# Patient Record
Sex: Female | Born: 1955
Health system: Southern US, Community
[De-identification: ages and names within clinical notes are randomized; demographics above are authoritative.]

## PROBLEM LIST (undated history)

## (undated) DIAGNOSIS — K5792 Diverticulitis of intestine, part unspecified, without perforation or abscess without bleeding: Secondary | ICD-10-CM

## (undated) DIAGNOSIS — M1990 Unspecified osteoarthritis, unspecified site: Secondary | ICD-10-CM

## (undated) DIAGNOSIS — M797 Fibromyalgia: Secondary | ICD-10-CM

## (undated) DIAGNOSIS — N289 Disorder of kidney and ureter, unspecified: Secondary | ICD-10-CM

## (undated) DIAGNOSIS — K900 Celiac disease: Secondary | ICD-10-CM

## (undated) HISTORY — PX: NECK SURGERY: SHX720

## (undated) HISTORY — PX: REDUCTION MAMMAPLASTY: SUR839

## (undated) HISTORY — DX: Fibromyalgia: M79.7

## (undated) HISTORY — DX: Unspecified osteoarthritis, unspecified site: M19.90

## (undated) HISTORY — DX: Celiac disease: K90.0

## (undated) HISTORY — PX: APPENDECTOMY: SHX54

## (undated) HISTORY — PX: COLONOSCOPY: SHX174

## (undated) HISTORY — DX: Diverticulitis of intestine, part unspecified, without perforation or abscess without bleeding: K57.92

## (undated) HISTORY — PX: BACK SURGERY: SHX140

## (undated) HISTORY — PX: TONSILLECTOMY AND ADENOIDECTOMY: SUR1326

---

## 1999-12-04 ENCOUNTER — Emergency Department (HOSPITAL_COMMUNITY): Admission: EM | Admit: 1999-12-04 | Discharge: 1999-12-04 | Payer: Self-pay | Admitting: Emergency Medicine

## 1999-12-04 ENCOUNTER — Encounter: Payer: Self-pay | Admitting: Family Medicine

## 1999-12-16 ENCOUNTER — Encounter: Admission: RE | Admit: 1999-12-16 | Discharge: 2000-03-15 | Payer: Self-pay | Admitting: Internal Medicine

## 2000-02-14 ENCOUNTER — Other Ambulatory Visit: Admission: RE | Admit: 2000-02-14 | Discharge: 2000-02-14 | Payer: Self-pay | Admitting: Obstetrics and Gynecology

## 2000-03-31 ENCOUNTER — Emergency Department (HOSPITAL_COMMUNITY): Admission: EM | Admit: 2000-03-31 | Discharge: 2000-03-31 | Payer: Self-pay | Admitting: Emergency Medicine

## 2000-03-31 ENCOUNTER — Encounter: Payer: Self-pay | Admitting: Emergency Medicine

## 2000-04-27 ENCOUNTER — Encounter: Payer: Self-pay | Admitting: Family Medicine

## 2000-04-27 ENCOUNTER — Encounter: Payer: Self-pay | Admitting: Emergency Medicine

## 2000-04-27 ENCOUNTER — Inpatient Hospital Stay (HOSPITAL_COMMUNITY): Admission: EM | Admit: 2000-04-27 | Discharge: 2000-05-01 | Payer: Self-pay | Admitting: Emergency Medicine

## 2000-09-05 ENCOUNTER — Emergency Department (HOSPITAL_COMMUNITY): Admission: EM | Admit: 2000-09-05 | Discharge: 2000-09-05 | Payer: Self-pay | Admitting: Emergency Medicine

## 2000-09-06 ENCOUNTER — Encounter: Payer: Self-pay | Admitting: Emergency Medicine

## 2000-09-06 ENCOUNTER — Ambulatory Visit (HOSPITAL_COMMUNITY): Admission: RE | Admit: 2000-09-06 | Discharge: 2000-09-06 | Payer: Self-pay | Admitting: Emergency Medicine

## 2001-03-02 ENCOUNTER — Ambulatory Visit (HOSPITAL_COMMUNITY): Admission: RE | Admit: 2001-03-02 | Discharge: 2001-03-02 | Payer: Self-pay | Admitting: Internal Medicine

## 2001-03-13 ENCOUNTER — Encounter (INDEPENDENT_AMBULATORY_CARE_PROVIDER_SITE_OTHER): Payer: Self-pay

## 2001-03-13 ENCOUNTER — Ambulatory Visit: Admission: RE | Admit: 2001-03-13 | Discharge: 2001-03-13 | Payer: Self-pay | Admitting: Internal Medicine

## 2001-07-13 ENCOUNTER — Other Ambulatory Visit: Admission: RE | Admit: 2001-07-13 | Discharge: 2001-07-13 | Payer: Self-pay | Admitting: Obstetrics and Gynecology

## 2003-09-30 ENCOUNTER — Encounter: Payer: Self-pay | Admitting: Internal Medicine

## 2003-09-30 ENCOUNTER — Ambulatory Visit (HOSPITAL_COMMUNITY): Admission: RE | Admit: 2003-09-30 | Discharge: 2003-09-30 | Payer: Self-pay | Admitting: Internal Medicine

## 2004-09-11 ENCOUNTER — Emergency Department (HOSPITAL_COMMUNITY): Admission: EM | Admit: 2004-09-11 | Discharge: 2004-09-11 | Payer: Self-pay | Admitting: Emergency Medicine

## 2005-02-08 ENCOUNTER — Ambulatory Visit: Payer: Self-pay | Admitting: Internal Medicine

## 2005-03-28 ENCOUNTER — Emergency Department (HOSPITAL_COMMUNITY): Admission: EM | Admit: 2005-03-28 | Discharge: 2005-03-28 | Payer: Self-pay | Admitting: Emergency Medicine

## 2005-04-04 ENCOUNTER — Emergency Department (HOSPITAL_COMMUNITY): Admission: EM | Admit: 2005-04-04 | Discharge: 2005-04-04 | Payer: Self-pay | Admitting: Emergency Medicine

## 2005-09-06 ENCOUNTER — Emergency Department (HOSPITAL_COMMUNITY): Admission: EM | Admit: 2005-09-06 | Discharge: 2005-09-06 | Payer: Self-pay | Admitting: Emergency Medicine

## 2005-09-09 ENCOUNTER — Ambulatory Visit: Payer: Self-pay | Admitting: Internal Medicine

## 2006-02-14 ENCOUNTER — Ambulatory Visit: Payer: Self-pay | Admitting: Internal Medicine

## 2006-03-14 ENCOUNTER — Ambulatory Visit: Payer: Self-pay | Admitting: Internal Medicine

## 2006-07-14 ENCOUNTER — Ambulatory Visit: Payer: Self-pay | Admitting: Internal Medicine

## 2006-12-26 ENCOUNTER — Ambulatory Visit: Payer: Self-pay | Admitting: Cardiovascular Disease

## 2006-12-26 ENCOUNTER — Observation Stay (HOSPITAL_COMMUNITY): Admission: EM | Admit: 2006-12-26 | Discharge: 2006-12-27 | Payer: Self-pay | Admitting: Internal Medicine

## 2006-12-26 ENCOUNTER — Encounter: Payer: Self-pay | Admitting: Emergency Medicine

## 2007-01-02 ENCOUNTER — Ambulatory Visit: Payer: Self-pay | Admitting: Cardiology

## 2007-01-02 ENCOUNTER — Encounter (HOSPITAL_COMMUNITY): Admission: RE | Admit: 2007-01-02 | Discharge: 2007-02-01 | Payer: Self-pay | Admitting: Cardiology

## 2007-01-05 ENCOUNTER — Ambulatory Visit: Payer: Self-pay | Admitting: Internal Medicine

## 2007-08-02 DIAGNOSIS — I10 Essential (primary) hypertension: Secondary | ICD-10-CM | POA: Insufficient documentation

## 2007-08-02 DIAGNOSIS — F329 Major depressive disorder, single episode, unspecified: Secondary | ICD-10-CM | POA: Insufficient documentation

## 2007-08-02 DIAGNOSIS — F3289 Other specified depressive episodes: Secondary | ICD-10-CM | POA: Insufficient documentation

## 2007-08-02 DIAGNOSIS — E119 Type 2 diabetes mellitus without complications: Secondary | ICD-10-CM | POA: Insufficient documentation

## 2007-12-17 ENCOUNTER — Ambulatory Visit (HOSPITAL_COMMUNITY): Admission: RE | Admit: 2007-12-17 | Discharge: 2007-12-17 | Payer: Self-pay | Admitting: Family Medicine

## 2009-12-19 HISTORY — PX: UPPER GASTROINTESTINAL ENDOSCOPY: SHX188

## 2010-09-24 ENCOUNTER — Ambulatory Visit (HOSPITAL_COMMUNITY): Admission: RE | Admit: 2010-09-24 | Discharge: 2010-09-24 | Payer: Self-pay | Admitting: Family Medicine

## 2010-09-27 ENCOUNTER — Ambulatory Visit (HOSPITAL_COMMUNITY): Admission: RE | Admit: 2010-09-27 | Discharge: 2010-09-27 | Payer: Self-pay | Admitting: Family Medicine

## 2010-10-05 ENCOUNTER — Ambulatory Visit: Payer: Self-pay | Admitting: Internal Medicine

## 2010-10-20 ENCOUNTER — Ambulatory Visit: Payer: Self-pay | Admitting: Internal Medicine

## 2010-10-20 ENCOUNTER — Ambulatory Visit (HOSPITAL_COMMUNITY): Admission: RE | Admit: 2010-10-20 | Discharge: 2010-10-20 | Payer: Self-pay | Admitting: Internal Medicine

## 2011-03-01 LAB — GLUCOSE, CAPILLARY: Glucose-Capillary: 143 mg/dL — ABNORMAL HIGH (ref 70–99)

## 2011-03-01 LAB — H. PYLORI ANTIBODY, IGG: H Pylori IgG: <0.4 {ISR}

## 2011-05-06 NOTE — H&P (Signed)
NAMESHEQUITA, PEPLINSKI               ACCOUNT NO.:  [id number]   MEDICAL RECORD NO.:  [id number]          PATIENT TYPE:  INP   LOCATION:  4729                         FACILITY:  [facility]   PHYSICIAN:  none                   DATE OF BIRTH:  06/04/1956   DATE OF ADMISSION:  12/26/2006  DATE OF DISCHARGE:                              HISTORY & PHYSICAL   CHIEF COMPLAINT:  Chest pain.   HISTORY OF PRESENT ILLNESS:  The patient is a 55 year old white female,  past medical history significant for diabetes, hypertension and tobacco  use presenting with new onset chest pressure and left arm pain.  The  patient states that this evening, while driving her car she experienced  left shoulder pain that radiated down to her left arm.  She describes  this pain as aching.  She went on to develop chest heaviness that was  associated with shortness of breath.  The patient states that there were  no exacerbating or alleviating factors.  The patient also denies any  history of similar symptoms.  She states that these symptoms waxed and  waned until she drove herself to the emergency room and was given  nitroglycerin.  Since then, she has had some fleeting aching in her arm,  but no return of her chest heaviness.  Patient denies any recent history  of palpitations, syncope, edema, PND or orthopnea.   PAST MEDICAL HISTORY/PAST SURGICAL HISTORY:  1. Lumbar disc surgery x5.  2. She is status post cervical fusion.  3. She has had bilateral reduction mammoplasty.  4. Cardiac past history/risk factors:  She had a left heart cath in      1997 showing nonobstructive coronary disease.  Patient also reports      she had a heart catheterization prior to 1997, which she remembers      as being normal.  5. Patient has diet controlled diabetes.  6. Hypertension.   FAMILY HISTORY:  Positive for CVA and negative for premature coronary  artery disease.   SOCIAL HISTORY:  Patient has approximately a 10 pack year history  of  tobacco and continues to smoke about a half pack of cigarettes daily.  She denies any alcohol use.   ALLERGIES:  PATIENT IS ALLERGIC TO:  1. ANCEF, WHICH SHE SAYS CAN CAUSE A CARDIAC ARREST WITH HER.  2. SHE IS ALSO ALLERGIC TO PENICILLIN, WHICH CAUSES A RASH.   CURRENT MEDICATIONS:  1. Maxzide 37.5/25 mg p.o. daily.  2. Seroquel 100 mg p.o. q.h.s.  3. Mobic p.r.n.  4. Vitamins.   REVIEW OF SYSTEMS:  Patient specifically denies any hematochezia,  melena, hematemesis, nausea, vomiting.  Other systems as in HPI;  otherwise, negative.   PHYSICAL EXAMINATION:  VITAL SIGNS:  Temperature of 98.2.  Pulse of 92.  Respirations 22.  Blood pressure 128/86.  Saturating 98% on 3 liters  nasal cannula O2.  GENERALLY:  She is alert and oriented in no acute distress.  HEENT:  Extraocular movements are intact.  Oropharynx is clear without  lesion  or exudate.  NECK:  No JVD or bruits.  HEART:  Regular rate and rhythm with a widely split S2.  LUNGS:  There is mild crackles at the right base; otherwise, clear to  auscultation bilaterally.  ABDOMEN:  Nontender.  Positive bowel sounds and soft.  EXTREMITIES:  Without edema.  NEURO EXAM:  Nonfocal.  PSYCH EXAM:  Patient is appropriate with normal levels of insight.   LABS:  Patient has a sodium of 139, potassium of 3.8, chloride 104, CO2  25, BUN 14, creatinine 0.9, glucose 106.  White count of 8.0, hemoglobin  14.0, hematocrit 41.0, platelets 211.  Calcium 9.0.  Troponin less than  0.05, MB less than 1.0.  Chest x-ray shows interstitial changes  consistent with chronic obstructive pulmonary disease.  EKG,  independently reviewed by myself, reveals sinus tachycardia without any  ST-segment abnormalities.   ASSESSMENT:  This is a 55 year old white female with diabetes,  hypertension and tobacco use presenting with new onset chest pain that  is consistent with unstable angina.   PLAN:  1. We placed the patient on telemetry, rule her out for  myocardial      infarction with serial troponins and CK-MBs.  We will check a      fasting lipid panel and a TSH in the morning.  2. We will place the patient on aspirin, heparin protocol and      Lopressor 25 mg p.o. t.i.d. and continue the nitroglycerin drip.  3. If the patient rules out for myocardial infarction, would favor a      stress test at this point.  4. If patient rules in for myocardial infarction, we will start a      2B/3A inhibitor.  5. Patient will be made n.p.o. and started on normal saline at 75 cc      an hour.  We will also replace her potassium.  6. Prophylaxis.  There is no need for deep venous thrombosis      prophylaxis as the patient is on a full dose anticoagulation with      heparin.      Brayton El, MD  Electronically Signed     ______________________________  none    SGA/MEDQ  D:  12/26/2006  T:  12/27/2006  Job:  [id number]

## 2011-05-06 NOTE — Assessment & Plan Note (Signed)
[facility]                       [facility] OFFICE NOTE   NAME:[name]                      MRN:          [id number]  DATE:01/05/2007                            DOB:          15-Sep-1956    IDENTIFICATION:  The patient is a 55 year old woman, who was recently  discharged from [facility] for evaluation of chest pain.  She was  discharged with set-up for an outpatient Myoview scan.   Since discharge, she has not had any recurrent chest pain.  She has had  some arm aching and she wonders if it is from her neck surgery and a  recent injury.   On January 02, 2007, she underwent exercise stress testing.  On the  Bruce Protocol, she exercised for 6 minutes 13 seconds to a peak heart  rate of 153, which was 91% predicted maximal.  Peak blood pressure  158/70.  Stopped because of fatigue.  The EKG showed 1 to 1.5 mm  inferior depression during exercise that resolved early in recovery, and  there was a 1 mm latent recovery in V5 and V6.  Nuclear scan, though,  showed normal perfusion, LVF of 65%.   CURRENT MEDICATIONS:  1. Mobic.  2. Seroquel 100.  3. Maxzide 37.5/25.  4. Lopressor 25 b.i.d.   PHYSICAL EXAMINATION:  On exam, the patient is in no distress.  Blood  pressure is 128/80, pulse is 90, weight 167.  NECK:  JVP is normal.  LUNGS:  Clear.  CARDIAC EXAM:  Regular rate and rhythm, S1, S2.  No S3, no murmurs.  ABDOMEN:  Benign.  EXTREMITIES:  No edema.   IMPRESSION:  1. Chest pain, arm pain.  I think it was more muscular.  I am not      convinced there is any active cardiac issue.  I have instructed the      patient to taper off her Lopressor since she was not on this      before.  She said today her blood pressure is actually a little      higher than usual.  It usually runs in the 110s.  If she has a      recurrence, then she should contact us.  Again, she has EKG      changes, as noted, but Myoview scan was good with  normal perfusion.      Again, I do not have the exact tracings to follow.  May represent      false positive.  With the normal Myoview, would not push further.  2. Health care maintenance:  Lipid panel - her LDL was 97, HDL 53,      triglycerides a little      elevated, around 220.  Counseled her about diet.  Counseled her to      increase exercise.  She is watching her sugars and says they are      well-controlled.   I will put followup p.r.n.  She is able to return to work on Monday.     Pricilla Riffle, MD, [facility]  Electronically Signed    PVR/MedQ  DD:  01/05/2007  DT: 01/05/2007  Job #: [id number]

## 2011-05-06 NOTE — Discharge Summary (Signed)
[name], [name]               ACCOUNT NO.:  [id number]   MEDICAL RECORD NO.:  [id number]          PATIENT TYPE:  INP   LOCATION:  4729                         FACILITY:  [facility]   PHYSICIAN:  Jesse Sans. Wall, MD, FACCDATE OF BIRTH:  09/26/1956   DATE OF ADMISSION:  12/26/2006  DATE OF DISCHARGE:  12/27/2006                               DISCHARGE SUMMARY   PRIMARY CARE PHYSICIAN:  Stacie Glaze, M.D.   CARDIOLOGY:  Pricilla Riffle, MD, [facility].   DISCHARGING PHYSICIAN:  Jesse Sans. Wall, MD, [facility]   DISCHARGE DIAGNOSIS:  1. Chest pain with negative cardiac enzymes this admission.  The      patient scheduled for an outpatient stress Myoview at [facility],  2. Dyslipidemia.  3. Ongoing tobacco use.  4. History of nonobstructive coronary artery disease.  The patient      reports cardiac catheterization in 1997, which she remembers as      being normal.   PAST MEDICAL HISTORY:  1,  Lumbar disk surgery x5.  1. Status post cervical fusion.  2. Bilateral reduction mammoplasty.  3. Diet-controlled diabetes.  4. Hypertension.   HOSPITAL COURSE:  [name] is 55 year old Caucasian female, past  medical history as stated above, who presented with new onset of chest  pressure and left arm pain, that she experienced while driving her car.  She described it as an ache that developed into heaviness associated  with shortness of breath.  Initial lab work:  Point-of-care markers were  negative.  Chest x-ray showed interstitial changes consistent with COPD.  EKG showed sinus tach without any acute ST or T-wave changes.  The  patient was placed on telemetry.  She ruled out for myocardial  infarction.  Other lab work was also obtained including a fasting lipid  panel and a TSH.  The patient was started on aspirin, heparin and  Lopressor in addition to nitroglycerin.  On the day of discharge, Dr.  Daleen Squibb in to examine the patient.  No further episodes of chest  discomfort.  The  patient is being discharged home to follow up at the  [place] office.   VITAL SIGNS:  Stable.  Patient afebrile.  Sating 95% on room air.   PRIOR TO DISCHARGE LABORATORY WORK:  H&H 12.9 and 37.6.  Potassium 4,  BUN and creatinine 11 and 0.5, glucose 89.  Cardiac enzymes negative x2  sets.  Total cholesterol 195, triglycerides 226, HDL of 53, LDL 97.  TSH  2.761.   MEDICATIONS AT TIME OF DISCHARGE:  1. Seroquel 100 mg daily.  2. Maxzide 37.5/25 mg daily.  3. Lopressor 25 mg b.i.d.  4. Aspirin 325 mg daily.  Note, the patient has dyslipidemia.  However, was not started on a  Statin during this hospitalization.  No Statin intolerance listed.  Will  leave this up to Dr. Tenny Craw and Dr. Lovell Sheehan' discretion at discharge.   The patient has been instructed to lose weight, increase exercise,  smoking tobacco cessation strongly encouraged.   1. I have scheduled her for a followup stress test on  Tuesday, January      15th at 8 a.m. at [facility].  2. Follow up with Dr. Tenny Craw at [facility] [place] on January 18 in      2:30 p.m.   The patient is also instructed to continue a heart healthy diet.   She may return to work after followup with Dr. Tenny Craw.  The necessary  information has been faxed to [facility] for the patient stress  test including a 12-lead EKG, cardiac markers, and the patient's home  address, phone number.  I have also given the patient exercise test and  nuclear scanning information with instructions to follow for her stress  test.   DURATION OF DISCHARGE ENCOUNTER:  Is 50 minutes.      [name], ACNP      Jesse Sans. Daleen Squibb, MD, [facility]  Electronically Signed    MB/MEDQ  D:  12/27/2006  T:  12/27/2006  Job:  [id number]   cc:   Stacie Glaze, MD

## 2011-05-06 NOTE — H&P (Signed)
[place]  Patient:    [name], [name]                      MRN: [id number] Adm. Date:  [id number] Attending:  Osvaldo Human CC:         Stacie Glaze, M.D. [facility]                         History and Physical  DATE OF BIRTH:  1956-11-13  HISTORY:  This is one of many [facility] admissions for this 55 year old, married, white female who came to the emergency room today for evaluation of abdominal pain.  The patient states she was well until last Saturday, which would have been six days ago.  At that time she noticing diffuse, crampy abdominal pain.  She has some left over Flagyl from her first bout of diverticulitis which occurred in the fall of 2000.  At that time she was treated in [place], [place].  She was admitted to [facility] for severe abdominal pain. Diagnostic studies included CT of the abdomen that showed diverticulitis.  She was treated with IV fluids, IV antibiotics, and pain medicines and her symptoms resolved.  She took all antibiotics for awhile as an outpatient.  She then stopped them and has done well until recently.  She started taking some leftover Flagyl, did that throughout the week.  However, today her pain became very severe and she came to the emergency room for evaluation.  She denies any fever, chills, nausea, vomiting, or diarrhea.  Indeed, she states she had a normal bowel movement today.  In the emergency room she was seen initially by Dr. Mancel Bale.  Diagnostic studies were done including normal white count. However, because of the severe pain she was given morphine and then Dilaudid. The pain calmed down, she felt better.  IV fluids were started.  We were called for evaluation.  PAST MEDICAL HISTORY:  She had lumbar disk surgery x 5.  She has had cervical fusion of four levels.  She was hospitalized for T&A as a child.  Had a reduction mammoplasty and in 1997 had her  second cardiac catheterization.  At that time it showed minor coronary disease.  Medical treatment was recommended to be continued.  In October 2000 she was ___________ [facility] as noted above for three days for diverticulitis.  PAST ILLNESSES:  Noted above, negative.  INJURIES:  Negative.  DRUG ALLERGIES:  NSAIDS give her respiratory distress.  SULFA gives her joint pain.  PENICILLIN gives her a rash.  She has side effects from the pain medicine, but no acute allergic reactions.  She does not smoke, or drink any alcohol.  CURRENT MEDICATIONS:  1. Tenormin 50 q.d.  2. Remeron 30 2 tabs q.h.s.  3. Maxzide 25 q.d.  4. Glucovance 500-5 q.d. for recent onset diabetes.  5. Ultram p.r.n. for back pain.  FAMILY HISTORY:  Mom is in a nursing home at [facility], 24.  She has had CVAs and strokes.  Dad died at 80 of lung cancer, ex-smoker.  Two brothers, one died just after birth, the other died at 76 of age, cause unknown. No sisters.  REVIEW OF SYSTEMS:  She has lost voluntarily about 15-20 pounds.  Dr. Lovell Sheehan has told her she has diabetes.  She has been on diet, exercise, weight loss, and medication.  HEENT:  She has had laser  surgery bilaterally to correct near sighted.  Cardiopulmonary:  Negative except the coronary disease noted above. She is asymptomatic and takes no medication for it.  GI:  Negative except for the diverticulitis.  GU:  Negative.  GYN:  Negative.  Musculoskeletal: Negative.  Endocrine:  Negative except for above.  Vaccinations within 10 years:  The last tetanus was approximately 1996.  PHYSICAL EVALUATION:  VITAL SIGNS:  Temperature 100.7, pulse 90 and regular, respirations 12 and regular, BP 128/60.  Weight 185.  GENERAL:  She is a well-developed, well-nourished, white female.  After having Dilaudid and morphine she feels more comfortable.  HEENT:  Negative.  NECK:  Supple.  Thyroid was not enlarged.  CHEST:  Clear to auscultation.  CARDIAC:   Negative.  ABDOMEN:  The abdomen is obese.  She says it is not distended.  Bowel sounds are barely audible.  There was diffuse tenderness most notably in the left lower quadrant.  No rebound.  RECTAL:  Exam was normal.  Stool was guaiac positive, trace.  It was brown.  EXTREMITIES:  Extremities normal.  Skin normal.  Peripheral pulses normal.  LABORATORY DATA:  White count 10,900 with a hemoglobin of 15.5.  Metabolic panel was normal as was the lipase, amylase, and urinalysis.  Three-way abdomen was negative.  IMPRESSION:  Diverticulitis.  PLAN:  1. Admit, IV fluids, IV antibiotics.  Will get a GI evaluation six weeks post     hospitalization for colonoscopy.  We will get a CT scan of her abdomen     tonight to be sure there is nothing else going on.  Without any     other abnormalities on her physical exam, etc., I do not think she has     perforated.  2. Status post lumbar spine surgery x 5.  3. Status post cervical fusion.  4. Status post T&A.  5. Status post bilateral breast reduction.  6. Status post cardiac catheterization which demonstrated minor coronary     disease.  7. Depression.  8. Diabetes, type 2.  9. History of hypertension.  We will also request records from [place], [place], [facility] DD:  04/27/00 TD:  04/27/00 Job: [id number]

## 2011-05-06 NOTE — Procedures (Signed)
[facility]  Patient:    [name], [name]                      MRN: [id number] Proc. Date: 03/13/01 Adm. Date:  [id number] Attending:  Avie Echevaria CC:         Valetta Mole Swords, M.D. [facility]   Procedure Report  PROCEDURE:  Fiberoptic bronchoscopy with transbronchial biopsy of the right lower lobe.  REFERRING PHYSICIAN:  Birdie Sons, M.D.  HISTORY AND INDICATIONS:  Please see comprehensive pulmonary consultation note dictated in the office records within the past two weeks. This is a 55 year old white female with mild dyspnea, cough and diffuse nodular more than reticular infiltrates on x-ray with marked elevation at base level and extensive hilar mediastinal adenopathy on CT scan. Differential diagnosis includes lymphoma and sarcoid and therefore a transbronchial biopsy was indicated to see if there was evidence of non-caseating granulomatous inflammation and a peribronchial distribution characteristic of sarcoid or not. She agreed to the procedure after a full discussion of the risks, benefits, and alternatives in the office.  The procedure was performed in the bronchoscopy suite with continuous monitoring by surface ECG and oximetry. She maintained greater than 90% saturations throughout the procedure in sinus rhythm with supplemental oxygen by nasal prongs. She received a total of 10 mg of IV Versed and 5 mg IV Demerol with adequate sedation and cough suppression.  The right naris and oropharynx were liberally anesthetized with 1% lidocaine aerosol. The right naris was additionally prepared with 2% lidocaine jelly.  Using a standard flexible fiberoptic bronchoscope, the right naris was easily cannulated with good visualization of the entire oropharynx and larynx. The cords move normally and there were no apparent upper airway lesions.  Using an additional 1% lidocaine as needed, the entire tracheobronchial tree was explored bilaterally with  the following findings:  FINDINGS:  All of the airways opened widely at the subsegmental level with no endobronchial abnormality. Specifically there was no evidence of cobble stoning or any mucosal irregularity at all.  DESCRIPTION OF PROCEDURE:  Therefore, using a wedge position in several of the basal segments of the right lower lobe, six adequate transbronchial biopsies were obtained and preserved in formalin for histology and special stains if indicated.  Additionally, the right middle lobe was lavaged with saline for cytology and AFB and fungal stain and culture.  The procedure was complicated by hemorrhage after the final transbronchial biopsy of approximately 50 cc. The airways were suctioned clear of blood and the patient maintained saturations in the right lateral decubitus position. Additionally she was given topical epinephrine 1 ampule into the right lower lobe with adequate hemostasis at the end of the procedure and no evidence of alveolar hemorrhage on fluoroscopy nor pneumothorax.  The patient otherwise tolerated the procedure quite well and a follow-up x-ray is pending at the time of dictation. Follow-up studies are also pending and the above studies are also pending and will be followed up as an outpatient. D:  03/13/01 TD:  03/14/01 Job: [id number]

## 2011-05-06 NOTE — Discharge Summary (Signed)
[place]  Patient:    [name], [name]                      MRN: [id number] Adm. Date:  [id number] Disc. Date: 05/01/00 Attending:  Evette Georges Dictator:   Cornell Barman, P.A. CC:         Stacie Glaze, M.D. [facility]                           Discharge Summary  DISCHARGE DIAGNOSES:  1. Diverticulitis.  2. Leukocytosis.  3. Hypertension.  HISTORY OF PRESENT ILLNESS: Ms. [name] is a 55 year old white female who presented to the [facility] Emergency Room with complaint of abdominal pain.  The patient gave a six day history of diffuse crampy abdominal pain.  She has had diverticulitis in the past and had taken Flagyl. She had some leftover Flagyl from that episode and took some fluid of the Flagyl.  PAST MEDICAL HISTORY:  1. Lumbar disk surgery x 5.  2. Cervical fusion at four levels.  3. Status post reduction mammoplasty in 1997.  4. Coronary artery disease, status post cardiac catheterization August 27, 1996 revealing 30% mid LAD and 50% diagonal before the bifurcation.  The     circumflex was normal.  The RCA was normal.  LV function was normal.  5. Non-insulin dependent diabetes mellitus.  6. Hypertension.  7. Former smoker.  8. History of depression.  9. History of hypercholesterolemia. 10. History of diverticulitis in the fall of 2000, confirmed by CT of the     abdomen.  ADMISSION LABORATORY DATA: WBC 10.9.  HOSPITAL COURSE: Abdominal pain consistent with acute diverticulitis - The patient was admitted for IV fluids, IV pain control, and IV antibiotics.  CT of the abdomen and pelvis was obtained and CT of the abdomen revealed distal descending colon diverticulitis.  CT of the pelvis revealed distal descending and proximal sigmoid colon diverticulitis.  The patient received four days of IV antibiotics.  The patients pain was controlled with Dilaudid.  Her condition has significantly improved and she is  tolerating diabetic diet at this time.  She is no longer having abdominal pain and is not requiring any kind of pain medications.  The patient is felt to be stable for discharge. She has a scheduled appointment with Dr. Darryll Capers on May 02, 2000.  At that time they can discuss GI follow-up and need for colonoscopy in probably six weeks or so.  DISCHARGE LABORATORY DATA: CBC and CMET were normal.  DISCHARGE MEDICATIONS:  1. Cipro 500 mg b.i.d. for six days.  2. Flagyl 500 mg t.i.d. for six days.  3. Tenormin 50 mg q.d.  4. Remeron 30 mg 2 tablets at bedtime.  5. Maxzide 25 mg q.d.  6. Glucovance 500/5 mg q.d.  FOLLOW-UP: Follow up with Dr. Lovell Sheehan on May 02, 2000 as scheduled. DD:  05/01/00 TD:  05/01/00 Job: [id number]

## 2011-09-26 ENCOUNTER — Other Ambulatory Visit: Payer: Self-pay | Admitting: Adult Health

## 2011-09-26 ENCOUNTER — Other Ambulatory Visit (HOSPITAL_COMMUNITY)
Admission: RE | Admit: 2011-09-26 | Discharge: 2011-09-26 | Disposition: A | Discharge status: Home / Self Care | Payer: BC Managed Care – PPO | Source: Ambulatory Visit | Attending: Obstetrics and Gynecology | Admitting: Obstetrics and Gynecology

## 2011-09-26 DIAGNOSIS — Z01419 Encounter for gynecological examination (general) (routine) without abnormal findings: Secondary | ICD-10-CM | POA: Insufficient documentation

## 2011-11-17 ENCOUNTER — Other Ambulatory Visit: Payer: Self-pay | Admitting: Dermatology

## 2011-11-29 ENCOUNTER — Other Ambulatory Visit: Payer: Self-pay | Admitting: Dermatology

## 2012-03-08 ENCOUNTER — Other Ambulatory Visit (HOSPITAL_COMMUNITY): Payer: Self-pay | Admitting: Internal Medicine

## 2012-03-08 DIAGNOSIS — Z139 Encounter for screening, unspecified: Secondary | ICD-10-CM

## 2012-03-12 ENCOUNTER — Ambulatory Visit (HOSPITAL_COMMUNITY): Payer: BC Managed Care – PPO

## 2012-03-26 ENCOUNTER — Ambulatory Visit (HOSPITAL_COMMUNITY)
Admission: RE | Admit: 2012-03-26 | Discharge: 2012-03-26 | Disposition: A | Discharge status: Home / Self Care | Payer: BC Managed Care – PPO | Source: Ambulatory Visit | Attending: Internal Medicine | Admitting: Internal Medicine

## 2012-03-26 DIAGNOSIS — Z139 Encounter for screening, unspecified: Secondary | ICD-10-CM

## 2012-03-26 DIAGNOSIS — Z1231 Encounter for screening mammogram for malignant neoplasm of breast: Secondary | ICD-10-CM | POA: Insufficient documentation

## 2013-04-10 ENCOUNTER — Other Ambulatory Visit: Payer: Self-pay | Admitting: Dermatology

## 2013-04-30 ENCOUNTER — Telehealth (INDEPENDENT_AMBULATORY_CARE_PROVIDER_SITE_OTHER): Payer: Self-pay | Admitting: *Deleted

## 2013-04-30 NOTE — Telephone Encounter (Signed)
Patient called our office on 04/29/13 and stated that she was having problems with her IBS -D . She has has 30 pound weight loss but this is intentional. She has Colonoscopy in 2011.  She ask if she should be seen by Dr.Rehman or Terri or if a medicine could be called in for her? I discussed this with Dr. Karilyn Cota on 04/29/13 and he says that the patient will need to have an appointment soon with whom ever has the first available. The patient may be reached (Cell) [phone] , Work is [employer] [phone] ext- [phone]

## 2013-05-01 NOTE — Telephone Encounter (Signed)
Apt has been scheduled for 05/02/13 with Dorene Ar, NP.

## 2013-05-02 ENCOUNTER — Ambulatory Visit (INDEPENDENT_AMBULATORY_CARE_PROVIDER_SITE_OTHER): Payer: BC Managed Care – PPO | Admitting: Internal Medicine

## 2013-09-24 ENCOUNTER — Other Ambulatory Visit (INDEPENDENT_AMBULATORY_CARE_PROVIDER_SITE_OTHER): Payer: Self-pay | Admitting: Internal Medicine

## 2013-09-24 ENCOUNTER — Encounter (INDEPENDENT_AMBULATORY_CARE_PROVIDER_SITE_OTHER): Payer: Self-pay | Admitting: Internal Medicine

## 2013-09-24 ENCOUNTER — Telehealth (INDEPENDENT_AMBULATORY_CARE_PROVIDER_SITE_OTHER): Payer: Self-pay | Admitting: *Deleted

## 2013-09-24 ENCOUNTER — Ambulatory Visit (INDEPENDENT_AMBULATORY_CARE_PROVIDER_SITE_OTHER): Payer: BC Managed Care – PPO | Admitting: Internal Medicine

## 2013-09-24 VITALS — BP 110/72 | HR 74 | Temp 98.3°F | Resp 18 | Ht 63.0 in | Wt 154.8 lb

## 2013-09-24 DIAGNOSIS — K900 Celiac disease: Secondary | ICD-10-CM

## 2013-09-24 NOTE — Telephone Encounter (Signed)
Per Dr.Rehman the patient will need to have labs drawn. 

## 2013-09-24 NOTE — Patient Instructions (Signed)
Physician will contact you with results of blood work. Keep a symptom diary and to office visit(many have diarrhea)

## 2013-09-24 NOTE — Progress Notes (Signed)
Presenting complaint;  To discuss further evaluation of recently diagnosed celiac disease.  History of present illness;  Patient is 57 year old Caucasian female who was recently evaluated by Dr. Pollyann Savoy for joint pains made worse following ingestion of bread and she also complained of intermittent diarrhea that she has had for many years. She was tested for celiac disease.gliadin peptide antibody IgG was elevated at a titer of 91.4 units(normal <20 U/mL).gliadin peptide  IgA antibody was normal and along with normal tissue transglutaminase antibodies.she was felt to have celiac disease and begun on gluten free diet. She has been on this diet for about one month and feels much better. She's not having diarrhea anymore and joint pain has resolved.she has not seen a dietitian but feels very comfortable with dietary changes.she has good appetite.she denies melena or rectal bleeding nausea vomiting or abdominal pain. 10 years ago she she weighed 234 pounds.she managed to lose down 240 pounds last year since then she has gained about 15 pounds.since weight loss her glucose levels and hemoglobin A1c have been normal. She has skin rash over her elbows and she believes she has mild psoriasis.  Current Medications: Current Outpatient Prescriptions  Medication Sig Dispense Refill  . ALPRAZolam (XANAX) 0.5 MG tablet Take 0.5 mg by mouth at bedtime as needed for sleep.      . Cholecalciferol (VITAMIN D3) 3000 UNITS TABS Take by mouth. Patient states hat she takes every other day      . diclofenac (VOLTAREN) 50 MG EC tablet Take 50 mg by mouth 2 (two) times daily.      . DULoxetine (CYMBALTA) 30 MG capsule Take 30 mg by mouth 2 (two) times daily.      . magnesium 30 MG tablet Take 30 mg by mouth daily.      . Manganese 15 MG TABS Take by mouth daily.      . potassium chloride SA (K-DUR,KLOR-CON) 20 MEQ tablet Take 20 mEq by mouth daily.      Marland Kitchen pyridOXINE (VITAMIN B-6) 100 MG tablet Take 100 mg by mouth  daily.      . QUEtiapine (SEROQUEL) 100 MG tablet Take 100 mg by mouth at bedtime.      . thiamine (VITAMIN B-1) 100 MG tablet Take 100 mg by mouth daily.      Marland Kitchen tiZANidine (ZANAFLEX) 4 MG capsule Take 4 mg by mouth at bedtime.      . triamterene-hydrochlorothiazide (MAXZIDE) 75-50 MG per tablet Take 1 tablet by mouth daily.      . vitamin B-12 (CYANOCOBALAMIN) 100 MCG tablet Take 100 mcg by mouth daily.       No current facility-administered medications for this visit.   Past medical history; History of diabetes mellitus. Her glucose levels and A1c have been normal since she has lost close to 80 lbs. History of pulmonary sarcoidosis diagnosed in 1993 and she has been in remission for 20 years. Hypertension. Depression. Fibromyalgia. She was treated for sigmoid diverticulitis in 2000 and October,2011. She had colonoscopy in November 2011 revealing pancolonic diverticulosis. She had 3 small polyps removed but these are hyperplastic. She had EGD in November 2011 revealing nonerosive antral gastritis or H. pylori serology was negative. Duodenal mucosa was normal. Status post tonsillectomy several years ago. She has had lumbar surgery on 5 different occasions. She also had neck surgery with fusion at 4 levels. She had a hemorrhoidectomy about 4 years ago.  Allergies; Allergies  Allergen Reactions  . Cefazolin   . Codeine   .  Meperidine Hcl   . Morphine   . Penicillins   . Sulfonamide Derivatives   Family history; Both parents are deceased. Mother died of CHF at 82 and father died of lung carcinoma also at 64. She does not have any siblings living. 2 brothers died at birth. Mother's side of the family from [place] background.  Social history; She is single. She does not smoke cigarettes or drink alcohol.    Physical exam; Blood pressure 110/72, pulse 74, temperature 98.3 F (36.8 C), temperature source Oral, resp. rate 18, height 5\' 3"  (1.6 m), weight 154 lb 12.8 oz  (70.217 kg). Patient is alert and in no acute distress. Conjunctiva is pink. Sclera is nonicteric Oropharyngeal mucosa is normal. No neck masses or thyromegaly noted. Cardiac exam with regular rhythm normal S1 and S2. No murmur or gallop noted. Lungs are clear to auscultation. Abdomen is symmetrical. Bowel sounds are normal. Abdomen is soft and nontender without organomegaly or masses. No LE edema or clubbing noted.  She has focal erythematous rash at right elbow.  Labs/studies Results: From 08/28/2013. WBC 5.0, H&H 13.1 and $38.00 and platelet count 218K. Sedimentation rate 18(0-22). Electrolytes within normal limits. Glucose 79. BUN 30. Creatinine 1.26. Bilirubin 0.4, AP 52, AST 23, ALT 24, total protein 7.4, albumin 4.7 and calcium 4.7. Gliadin peptide IgG antibody 91.4(<20 U/mL). Gliadin peptide IgA antibody 12.1(<20 U/mL). Tissue transglutaminase IgG antibody 4.6(<20 U/mL). Tissue transglutaminase IgA antibody 5.8(<20 U/mL).    Assessment:  #1. Celiac disease. This diagnosis is based on positive gliadin appetite IgG antibody and resolution of patient's diarrhea and joint pains with gluten-free diet. This antibody has low sensitivity but high specificity. It can be positive in patients who do not have celiac disease. It would be nice to also see positive tissue transglutaminase or endometrial both of which have high sensitivity and specificity. Since patient has experienced dramatic resolution of her diarrhea and joint pains it would be reasonable to make a diagnosis of celiac disease. Small bowel/duodenal biopsy now that she's been on gluten free diet may not be helpful unless she is challenged with gluten. At this point I would recommend to monitor her course over the next few months and then determine whether or not she should undergo gluten challenge and duodenal biopsy. It would be interesting to determine if gliadin peptide antibody titer has decreased with gluten-free  diet.   Recommendations;  Celiac antibody panel will be repeated. Patient given 3 websites pertaining to celiac disease. Office visit in 3 months.

## 2013-09-25 LAB — GLIADIN ANTIBODIES, SERUM
Gliadin IgA: 14 U/mL (ref <20)
Gliadin IgG: 102 U/mL — ABNORMAL HIGH (ref <20)

## 2013-09-25 LAB — RETICULIN ANTIBODIES, IGA W TITER: Reticulin Ab, IgA: NEGATIVE

## 2013-09-26 ENCOUNTER — Encounter (INDEPENDENT_AMBULATORY_CARE_PROVIDER_SITE_OTHER): Payer: Self-pay | Admitting: Internal Medicine

## 2013-09-27 LAB — ENDOMYSIAL AB IGA RFLX TITER: Endomysial Screen: NEGATIVE

## 2013-10-25 ENCOUNTER — Encounter (INDEPENDENT_AMBULATORY_CARE_PROVIDER_SITE_OTHER): Payer: Self-pay

## 2013-12-30 ENCOUNTER — Ambulatory Visit (INDEPENDENT_AMBULATORY_CARE_PROVIDER_SITE_OTHER): Payer: BC Managed Care – PPO | Admitting: Internal Medicine

## 2014-01-07 ENCOUNTER — Telehealth (INDEPENDENT_AMBULATORY_CARE_PROVIDER_SITE_OTHER): Payer: Self-pay | Admitting: *Deleted

## 2014-01-07 NOTE — Telephone Encounter (Signed)
Patient has called, left message, that she is trying to make another appointment to be seen. She recently had one but canceled because she had a virus. Her cell number is [phone]. Her work number @ [employer] is [phone] - ext [phone]. She will be back there after 4 pm today.

## 2014-01-09 NOTE — Telephone Encounter (Signed)
[name] is going to have her labs from the [facility]umatologist [facility] faxed to Dr. Laural Golden for review. She is going to wait and see what she says before scheduling her apt.

## 2014-07-02 ENCOUNTER — Other Ambulatory Visit: Payer: Self-pay | Admitting: Adult Health

## 2014-07-28 ENCOUNTER — Ambulatory Visit: Payer: BC Managed Care – PPO | Admitting: Neurology

## 2014-07-28 ENCOUNTER — Ambulatory Visit: Payer: BC Managed Care – PPO | Admitting: Diagnostic Neuroimaging

## 2014-08-08 ENCOUNTER — Encounter: Payer: Self-pay | Admitting: Neurology

## 2014-08-12 ENCOUNTER — Other Ambulatory Visit (HOSPITAL_COMMUNITY): Payer: Self-pay | Admitting: Physician Assistant

## 2014-08-12 ENCOUNTER — Ambulatory Visit (HOSPITAL_COMMUNITY)
Admission: RE | Admit: 2014-08-12 | Discharge: 2014-08-12 | Disposition: A | Discharge status: Home / Self Care | Payer: BC Managed Care – PPO | Source: Ambulatory Visit | Attending: Physician Assistant | Admitting: Physician Assistant

## 2014-08-12 DIAGNOSIS — M25559 Pain in unspecified hip: Secondary | ICD-10-CM | POA: Insufficient documentation

## 2014-08-12 DIAGNOSIS — M25552 Pain in left hip: Secondary | ICD-10-CM

## 2014-08-12 DIAGNOSIS — M169 Osteoarthritis of hip, unspecified: Secondary | ICD-10-CM | POA: Insufficient documentation

## 2014-08-12 DIAGNOSIS — M1610 Unilateral primary osteoarthritis, unspecified hip: Secondary | ICD-10-CM | POA: Insufficient documentation

## 2014-09-04 ENCOUNTER — Other Ambulatory Visit (HOSPITAL_COMMUNITY): Payer: Self-pay | Admitting: Pain Medicine

## 2014-09-04 DIAGNOSIS — M545 Low back pain: Secondary | ICD-10-CM

## 2014-09-08 ENCOUNTER — Ambulatory Visit: Payer: BC Managed Care – PPO | Admitting: Neurology

## 2014-09-08 ENCOUNTER — Ambulatory Visit (HOSPITAL_COMMUNITY): Payer: BC Managed Care – PPO

## 2014-09-22 ENCOUNTER — Ambulatory Visit: Payer: BC Managed Care – PPO | Admitting: Neurology

## 2014-10-31 ENCOUNTER — Ambulatory Visit (INDEPENDENT_AMBULATORY_CARE_PROVIDER_SITE_OTHER): Payer: BC Managed Care – PPO | Admitting: Internal Medicine

## 2014-11-03 ENCOUNTER — Ambulatory Visit (INDEPENDENT_AMBULATORY_CARE_PROVIDER_SITE_OTHER): Payer: BC Managed Care – PPO | Admitting: Internal Medicine

## 2014-11-26 ENCOUNTER — Emergency Department (HOSPITAL_COMMUNITY)
Admission: EM | Admit: 2014-11-26 | Discharge: 2014-11-26 | Disposition: A | Discharge status: Home / Self Care | Payer: BC Managed Care – PPO | Attending: Emergency Medicine | Admitting: Emergency Medicine

## 2014-11-26 ENCOUNTER — Encounter (HOSPITAL_COMMUNITY): Payer: Self-pay | Admitting: *Deleted

## 2014-11-26 DIAGNOSIS — Z791 Long term (current) use of non-steroidal anti-inflammatories (NSAID): Secondary | ICD-10-CM | POA: Insufficient documentation

## 2014-11-26 DIAGNOSIS — Z880 Allergy status to penicillin: Secondary | ICD-10-CM | POA: Diagnosis not present

## 2014-11-26 DIAGNOSIS — M545 Low back pain, unspecified: Secondary | ICD-10-CM

## 2014-11-26 DIAGNOSIS — Z87891 Personal history of nicotine dependence: Secondary | ICD-10-CM | POA: Diagnosis not present

## 2014-11-26 DIAGNOSIS — M62830 Muscle spasm of back: Secondary | ICD-10-CM | POA: Insufficient documentation

## 2014-11-26 DIAGNOSIS — Z9889 Other specified postprocedural states: Secondary | ICD-10-CM | POA: Diagnosis not present

## 2014-11-26 DIAGNOSIS — G8929 Other chronic pain: Secondary | ICD-10-CM | POA: Insufficient documentation

## 2014-11-26 DIAGNOSIS — Z8719 Personal history of other diseases of the digestive system: Secondary | ICD-10-CM | POA: Diagnosis not present

## 2014-11-26 DIAGNOSIS — M25551 Pain in right hip: Secondary | ICD-10-CM | POA: Diagnosis not present

## 2014-11-26 DIAGNOSIS — Z79899 Other long term (current) drug therapy: Secondary | ICD-10-CM | POA: Insufficient documentation

## 2014-11-26 MED ORDER — OXYCODONE-ACETAMINOPHEN 5-325 MG PO TABS
1.0000 | ORAL_TABLET | Freq: Once | ORAL | Status: AC
  Administered 2014-11-26: 1 via ORAL
  Filled 2014-11-26: qty 1

## 2014-11-26 MED ORDER — OXYCODONE-ACETAMINOPHEN 5-325 MG PO TABS: 1.0000 | ORAL_TABLET | Freq: Four times a day (QID) | ORAL | Status: DC | PRN

## 2014-11-26 MED ORDER — IBUPROFEN 800 MG PO TABS: 800.0000 mg | ORAL_TABLET | Freq: Three times a day (TID) | ORAL | Status: DC | PRN

## 2014-11-26 MED ORDER — IBUPROFEN 800 MG PO TABS
800.0000 mg | ORAL_TABLET | Freq: Once | ORAL | Status: AC
  Administered 2014-11-26: 800 mg via ORAL
  Filled 2014-11-26: qty 1

## 2014-11-26 MED ORDER — ONDANSETRON 4 MG PO TBDP: 4.0000 mg | ORAL_TABLET | Freq: Three times a day (TID) | ORAL | Status: DC | PRN

## 2014-11-26 MED ORDER — ONDANSETRON 4 MG PO TBDP
4.0000 mg | ORAL_TABLET | Freq: Once | ORAL | Status: AC
  Administered 2014-11-26: 4 mg via ORAL
  Filled 2014-11-26: qty 1

## 2014-11-26 MED ORDER — DIAZEPAM 5 MG PO TABS: 5.0000 mg | ORAL_TABLET | Freq: Three times a day (TID) | ORAL | Status: DC | PRN

## 2014-11-26 MED ORDER — DIAZEPAM 5 MG PO TABS
5.0000 mg | ORAL_TABLET | Freq: Once | ORAL | Status: AC
  Administered 2014-11-26: 5 mg via ORAL
  Filled 2014-11-26: qty 1

## 2014-11-26 NOTE — ED Notes (Signed)
Pt reporting chronic back pain that has worsened in past two weeks.  Reports pain in lower back and into right hip.

## 2014-11-26 NOTE — ED Provider Notes (Signed)
TIME SEEN: 4:25 AM  CHIEF COMPLAINT: Back pain, right hip pain  HPI: Pt is a 58 y.o. F with history of arthritis, fibromyalgia, chronic back pain who has had multiple back surgeries who presents to the emergency department with complaints of right hip and lower back pain for the past 2 weeks. She states she is with a pain management clinic but does not take any pain medication other than the Zanaflex at bedtime. She states she did not like the way that narcotics made her feel so she stopped taking them. She states that over the past 2 weeks she has had spasming in her lower back and pain in her right hip. Right hip pain is worse with turning certain directions and better with walking. She has not tried any medications at home for her pain. Denies any numbness, tingling or focal weakness. No fever. No new injury. No bowel or bladder incontinence. She has had left-sided hip pain that was similar in the past and was told she had bursitis.  ROS: See HPI Constitutional: no fever  Eyes: no drainage  ENT: no runny nose   Cardiovascular:  no chest pain  Resp: no SOB  GI: no vomiting GU: no dysuria Integumentary: no rash  Allergy: no hives  Musculoskeletal: no leg swelling  Neurological: no slurred speech ROS otherwise negative  PAST MEDICAL HISTORY/PAST SURGICAL HISTORY:  Past Medical History  Diagnosis Date  . Celiac disease   . Arthritis   . Osteoarthritis (arthritis due to wear and tear of joints)   . Fibromyalgia   . Diverticulitis     MEDICATIONS:  Prior to Admission medications   Medication Sig Start Date End Date Taking? Authorizing Provider  ALPRAZolam Duanne Moron) 0.5 MG tablet Take 0.5 mg by mouth at bedtime as needed for sleep.   Yes Historical Provider, MD  Cholecalciferol (VITAMIN D3) 3000 UNITS TABS Take by mouth. Patient states hat she takes every other day   Yes Historical Provider, MD  DULoxetine (CYMBALTA) 30 MG capsule Take 30 mg by mouth 2 (two) times daily.   Yes Historical  Provider, MD  magnesium 30 MG tablet Take 30 mg by mouth daily.   Yes Historical Provider, MD  Manganese 15 MG TABS Take by mouth daily.   Yes Historical Provider, MD  potassium chloride SA (K-DUR,KLOR-CON) 20 MEQ tablet Take 20 mEq by mouth daily.   Yes Historical Provider, MD  QUEtiapine (SEROQUEL) 100 MG tablet Take 100 mg by mouth at bedtime.   Yes Historical Provider, MD  thiamine (VITAMIN B-1) 100 MG tablet Take 100 mg by mouth daily.   Yes Historical Provider, MD  tiZANidine (ZANAFLEX) 4 MG capsule Take 4 mg by mouth at bedtime.   Yes Historical Provider, MD  triamterene-hydrochlorothiazide (MAXZIDE) 75-50 MG per tablet Take 1 tablet by mouth daily.   Yes Historical Provider, MD  vitamin B-12 (CYANOCOBALAMIN) 100 MCG tablet Take 100 mcg by mouth daily.   Yes Historical Provider, MD  diclofenac (VOLTAREN) 50 MG EC tablet Take 50 mg by mouth 2 (two) times daily.    Historical Provider, MD  pyridOXINE (VITAMIN B-6) 100 MG tablet Take 100 mg by mouth daily.    Historical Provider, MD    ALLERGIES:  Allergies  Allergen Reactions  . Cefazolin   . Codeine   . Meperidine Hcl   . Morphine   . Penicillins   . Sulfonamide Derivatives     SOCIAL HISTORY:  History  Substance Use Topics  . Smoking status: Former Smoker  Types: Cigarettes    Start date: 04/24/2012  . Smokeless tobacco: Never Used  . Alcohol Use: No    FAMILY HISTORY: Family History  Problem Relation Age of Onset  . Heart disease Mother     EXAM: BP 126/81 mmHg  Pulse 87  Temp(Src) 98.2 F (36.8 C) (Oral)  Resp 16  Ht 5\' 3"  (1.6 m)  Wt 135 lb (61.236 kg)  BMI 23.92 kg/m2  SpO2 98% CONSTITUTIONAL: Alert and oriented and responds appropriately to questions. Well-appearing; well-nourished HEAD: Normocephalic EYES: Conjunctivae clear, PERRL ENT: normal nose; no rhinorrhea; moist mucous membranes; pharynx without lesions noted NECK: Supple, no meningismus, no LAD  CARD: RRR; S1 and S2 appreciated; no  murmurs, no clicks, no rubs, no gallops RESP: Normal chest excursion without splinting or tachypnea; breath sounds clear and equal bilaterally; no wheezes, no rhonchi, no rales,  ABD/GI: Normal bowel sounds; non-distended; soft, non-tender, no rebound, no guarding BACK:  The back appears normal and tender to palpation over the lumbar paraspinal musculature with associated muscle spasm bilaterally, no midline spinal tenderness or step-off or deformity, no CVA tenderness EXT: Patient is tender to palpation over the right lateral hip without joint effusion, erythema or warmth, induration, normal range of motion in the right hip, no leg length discrepancy, 2+ DP pulses bilaterally, Normal ROM in all joints; otherwise extremities are non-tender to palpation; no edema; normal capillary refill; no cyanosis; no joint effusions, compartments soft, no calf tenderness or swelling    SKIN: Normal color for age and race; warm NEURO: Moves all extremities equally; sensation to light touch intact diffusely, 2+ bilateral Achilles and patellar deep tendon reflexes, no clonus, no saddle anesthesia PSYCH: The patient's mood and manner are appropriate. Grooming and personal hygiene are appropriate.  MEDICAL DECISION MAKING: Patient here with lumbar paraspinal muscle spasm and right hip pain that is likely secondary to bursitis. No history of injury. I do not feel x-rays are indicated at this time. She has no neurologic deficits, no red flag symptoms. Denies recent back surgery or epidural injections. No fever. I do not feel she needs an MRI of her spine at this time. I am not concerned for cauda equina, spinal stenosis, transverse myelitis, discitis, epidural abscess or hematoma. We'll treat with Percocet, ibuprofen, Valium and Zofran.  ED PROGRESS: 5:30 AM  Pt reports feeling better. We'll discharge with prescriptions for the same and work note for today. She has follow-up with [facility]. Discussed return  precautions. Patient verbalizes understanding and is comfortable with plan.     [place], DO 11/26/14 [phone]

## 2014-11-26 NOTE — Discharge Instructions (Signed)
Heat Therapy Heat therapy can help ease sore, stiff, injured, and tight muscles and joints. Heat relaxes your muscles, which may help ease your pain.  RISKS AND COMPLICATIONS If you have any of the following conditions, do not use heat therapy unless your health care provider has approved:  Poor circulation.  Healing wounds or scarred skin in the area being treated.  Diabetes, heart disease, or high blood pressure.  Not being able to feel (numbness) the area being treated.  Unusual swelling of the area being treated.  Active infections.  Blood clots.  Cancer.  Inability to communicate pain. This may include young children and people who have problems with their brain function (dementia).  Pregnancy. Heat therapy should only be used on old, pre-existing, or long-lasting (chronic) injuries. Do not use heat therapy on new injuries unless directed by your health care provider. HOW TO USE HEAT THERAPY There are several different kinds of heat therapy, including:  Moist heat pack.  Warm water bath.  Hot water bottle.  Electric heating pad.  Heated gel pack.  Heated wrap.  Electric heating pad. Use the heat therapy method suggested by your health care provider. Follow your health care provider's instructions on when and how to use heat therapy. GENERAL HEAT THERAPY RECOMMENDATIONS  Do not sleep while using heat therapy. Only use heat therapy while you are awake.  Your skin may turn pink while using heat therapy. Do not use heat therapy if your skin turns red.  Do not use heat therapy if you have new pain.  High heat or long exposure to heat can cause burns. Be careful when using heat therapy to avoid burning your skin.  Do not use heat therapy on areas of your skin that are already irritated, such as with a rash or sunburn. SEEK MEDICAL CARE IF:  You have blisters, redness, swelling, or numbness.  You have new pain.  Your pain is worse. MAKE SURE  YOU:  Understand these instructions.  Will watch your condition.  Will get help right away if you are not doing well or get worse. Document Released: 02/27/2012 Document Revised: 04/21/2014 Document Reviewed: 01/28/2014 [facility] Patient Information 2015 [place], [place]. This information is not intended to replace advice given to you by your health care provider. Make sure you discuss any questions you have with your health care provider.  Hip Pain Your hip is the joint between your upper legs and your lower pelvis. The bones, cartilage, tendons, and muscles of your hip joint perform a lot of work each day supporting your body weight and allowing you to move around. Hip pain can range from a minor ache to severe pain in one or both of your hips. Pain may be felt on the inside of the hip joint near the groin, or the outside near the buttocks and upper thigh. You may have swelling or stiffness as well.  HOME CARE INSTRUCTIONS   Take medicines only as directed by your health care provider.  Apply ice to the injured area:  Put ice in a plastic bag.  Place a towel between your skin and the bag.  Leave the ice on for 15-20 minutes at a time, 3-4 times a day.  Keep your leg raised (elevated) when possible to lessen swelling.  Avoid activities that cause pain.  Follow specific exercises as directed by your health care provider.  Sleep with a pillow between your legs on your most comfortable side.  Record how often you have hip pain, the  location of the pain, and what it feels like. SEEK MEDICAL CARE IF:   You are unable to put weight on your leg.  Your hip is red or swollen or very tender to touch.  Your pain or swelling continues or worsens after 1 week.  You have increasing difficulty walking.  You have a fever. SEEK IMMEDIATE MEDICAL CARE IF:   You have fallen.  You have a sudden increase in pain and swelling in your hip. MAKE SURE YOU:   Understand these  instructions.  Will watch your condition.  Will get help right away if you are not doing well or get worse. Document Released: 05/25/2010 Document Revised: 04/21/2014 Document Reviewed: 08/01/2013 [facility] Patient Information 2015 [place], [place]. This information is not intended to replace advice given to you by your health care provider. Make sure you discuss any questions you have with your health care provider.  Lumbosacral Strain Lumbosacral strain is a strain of any of the parts that make up your lumbosacral vertebrae. Your lumbosacral vertebrae are the bones that make up the lower third of your backbone. Your lumbosacral vertebrae are held together by muscles and tough, fibrous tissue (ligaments).  CAUSES  A sudden blow to your back can cause lumbosacral strain. Also, anything that causes an excessive stretch of the muscles in the low back can cause this strain. This is typically seen when people exert themselves strenuously, fall, lift heavy objects, bend, or crouch repeatedly. RISK FACTORS  Physically demanding work.  Participation in pushing or pulling sports or sports that require a sudden twist of the back (tennis, golf, baseball).  Weight lifting.  Excessive lower back curvature.  Forward-tilted pelvis.  Weak back or abdominal muscles or both.  Tight hamstrings. SIGNS AND SYMPTOMS  Lumbosacral strain may cause pain in the area of your injury or pain that moves (radiates) down your leg.  DIAGNOSIS Your health care provider can often diagnose lumbosacral strain through a physical exam. In some cases, you may need tests such as X-ray exams.  TREATMENT  Treatment for your lower back injury depends on many factors that your clinician will have to evaluate. However, most treatment will include the use of anti-inflammatory medicines. HOME CARE INSTRUCTIONS   Avoid hard physical activities (tennis, racquetball, waterskiing) if you are not in proper physical condition for it.  This may aggravate or create problems.  If you have a back problem, avoid sports requiring sudden body movements. Swimming and walking are generally safer activities.  Maintain good posture.  Maintain a healthy weight.  For acute conditions, you may put ice on the injured area.  Put ice in a plastic bag.  Place a towel between your skin and the bag.  Leave the ice on for 20 minutes, 2-3 times a day.  When the low back starts healing, stretching and strengthening exercises may be recommended. SEEK MEDICAL CARE IF:  Your back pain is getting worse.  You experience severe back pain not relieved with medicines. SEEK IMMEDIATE MEDICAL CARE IF:   You have numbness, tingling, weakness, or problems with the use of your arms or legs.  There is a change in bowel or bladder control.  You have increasing pain in any area of the body, including your belly (abdomen).  You notice shortness of breath, dizziness, or feel faint.  You feel sick to your stomach (nauseous), are throwing up (vomiting), or become sweaty.  You notice discoloration of your toes or legs, or your feet get very cold. MAKE SURE YOU:  Understand these instructions.  Will watch your condition.  Will get help right away if you are not doing well or get worse. Document Released: 09/14/2005 Document Revised: 12/10/2013 Document Reviewed: 07/24/2013 [facility] Patient Information 2015 [place], [place]. This information is not intended to replace advice given to you by your health care provider. Make sure you discuss any questions you have with your health care provider.   Possible Hip Bursitis Bursitis is a swelling and soreness (inflammation) of a fluid-filled sac (bursa). This sac overlies and protects the joints.  CAUSES   Injury.  Overuse of the muscles surrounding the joint.  Arthritis.  Gout.  Infection.  Cold weather.  Inadequate warm-up and conditioning prior to activities. The cause may not be  known.  SYMPTOMS   Mild to severe irritation.  Tenderness and swelling over the outside of the hip.  Pain with motion of the hip.  If the bursa becomes infected, a fever may be present. Redness, tenderness, and warmth will develop over the hip. Symptoms usually lessen in 3 to 4 weeks with treatment, but can come back. TREATMENT If conservative treatment does not work, your caregiver may advise draining the bursa and injecting cortisone into the area. This may speed up the healing process. This may also be used as an initial treatment of choice. HOME CARE INSTRUCTIONS   Apply ice to the affected area for 15-20 minutes every 3 to 4 hours while awake for the first 2 days. Put the ice in a plastic bag and place a towel between the bag of ice and your skin.  Rest the painful joint as much as possible, but continue to put the joint through a normal range of motion at least 4 times per day. When the pain lessens, begin normal, slow movements and usual activities to help prevent stiffness of the hip.  Only take over-the-counter or prescription medicines for pain, discomfort, or fever as directed by your caregiver.  Use crutches to limit weight bearing on the hip joint, if advised.  Elevate your painful hip to reduce swelling. Use pillows for propping and cushioning your legs and hips.  Gentle massage may provide comfort and decrease swelling. SEEK IMMEDIATE MEDICAL CARE IF:   Your pain increases even during treatment, or you are not improving.  You have a fever.  You have heat and inflammation over the involved bursa.  You have any other questions or concerns. MAKE SURE YOU:   Understand these instructions.  Will watch your condition.  Will get help right away if you are not doing well or get worse. Document Released: 05/27/2002 Document Revised: 02/27/2012 Document Reviewed: 12/24/2008 [facility] Patient Information 2015 [place], [place]. This information is not intended to replace  advice given to you by your health care provider. Make sure you discuss any questions you have with your health care provider.

## 2015-03-12 ENCOUNTER — Emergency Department (HOSPITAL_COMMUNITY)
Admission: EM | Admit: 2015-03-12 | Discharge: 2015-03-12 | Disposition: A | Discharge status: Home / Self Care | Payer: Worker's Compensation | Attending: Emergency Medicine | Admitting: Emergency Medicine

## 2015-03-12 ENCOUNTER — Emergency Department (HOSPITAL_COMMUNITY): Payer: Worker's Compensation

## 2015-03-12 ENCOUNTER — Encounter (HOSPITAL_COMMUNITY): Payer: Self-pay

## 2015-03-12 DIAGNOSIS — R55 Syncope and collapse: Secondary | ICD-10-CM | POA: Diagnosis present

## 2015-03-12 DIAGNOSIS — Z880 Allergy status to penicillin: Secondary | ICD-10-CM | POA: Diagnosis not present

## 2015-03-12 DIAGNOSIS — M542 Cervicalgia: Secondary | ICD-10-CM

## 2015-03-12 DIAGNOSIS — Y929 Unspecified place or not applicable: Secondary | ICD-10-CM | POA: Diagnosis not present

## 2015-03-12 DIAGNOSIS — S79912A Unspecified injury of left hip, initial encounter: Secondary | ICD-10-CM | POA: Insufficient documentation

## 2015-03-12 DIAGNOSIS — Z8719 Personal history of other diseases of the digestive system: Secondary | ICD-10-CM | POA: Insufficient documentation

## 2015-03-12 DIAGNOSIS — Z87891 Personal history of nicotine dependence: Secondary | ICD-10-CM | POA: Insufficient documentation

## 2015-03-12 DIAGNOSIS — W19XXXA Unspecified fall, initial encounter: Secondary | ICD-10-CM | POA: Diagnosis not present

## 2015-03-12 DIAGNOSIS — M1990 Unspecified osteoarthritis, unspecified site: Secondary | ICD-10-CM | POA: Diagnosis not present

## 2015-03-12 DIAGNOSIS — S0990XA Unspecified injury of head, initial encounter: Secondary | ICD-10-CM | POA: Diagnosis not present

## 2015-03-12 DIAGNOSIS — Z87448 Personal history of other diseases of urinary system: Secondary | ICD-10-CM | POA: Diagnosis not present

## 2015-03-12 DIAGNOSIS — Y998 Other external cause status: Secondary | ICD-10-CM | POA: Insufficient documentation

## 2015-03-12 DIAGNOSIS — Y939 Activity, unspecified: Secondary | ICD-10-CM | POA: Insufficient documentation

## 2015-03-12 DIAGNOSIS — M797 Fibromyalgia: Secondary | ICD-10-CM | POA: Diagnosis not present

## 2015-03-12 DIAGNOSIS — Z79899 Other long term (current) drug therapy: Secondary | ICD-10-CM | POA: Diagnosis not present

## 2015-03-12 DIAGNOSIS — S199XXA Unspecified injury of neck, initial encounter: Secondary | ICD-10-CM | POA: Insufficient documentation

## 2015-03-12 HISTORY — DX: Disorder of kidney and ureter, unspecified: N28.9

## 2015-03-12 LAB — BASIC METABOLIC PANEL
Anion gap: 10 (ref 5–15)
BUN: 34 mg/dL — ABNORMAL HIGH (ref 6–23)
CALCIUM: 9.9 mg/dL (ref 8.4–10.5)
CO2: 25 mmol/L (ref 19–32)
Chloride: 101 mmol/L (ref 96–112)
Creatinine, Ser: 0.93 mg/dL (ref 0.50–1.10)
GFR calc Af Amer: 76 mL/min — ABNORMAL LOW (ref >90)
GFR, EST NON AFRICAN AMERICAN: 65 mL/min — AB (ref >90)
GLUCOSE: 97 mg/dL (ref 70–99)
Potassium: 3.6 mmol/L (ref 3.5–5.1)
Sodium: 136 mmol/L (ref 135–145)

## 2015-03-12 LAB — CBC WITH DIFFERENTIAL/PLATELET
BASOS PCT: 0 % (ref 0–1)
Basophils Absolute: 0 10*3/uL (ref 0.0–0.1)
Eosinophils Absolute: 0 10*3/uL (ref 0.0–0.7)
Eosinophils Relative: 1 % (ref 0–5)
HEMATOCRIT: 43.5 % (ref 36.0–46.0)
HEMOGLOBIN: 14.5 g/dL (ref 12.0–15.0)
LYMPHS ABS: 1.6 10*3/uL (ref 0.7–4.0)
Lymphocytes Relative: 19 % (ref 12–46)
MCH: 29.5 pg (ref 26.0–34.0)
MCHC: 33.3 g/dL (ref 30.0–36.0)
MCV: 88.4 fL (ref 78.0–100.0)
MONO ABS: 0.5 10*3/uL (ref 0.1–1.0)
MONOS PCT: 6 % (ref 3–12)
NEUTROS ABS: 6 10*3/uL (ref 1.7–7.7)
Neutrophils Relative %: 74 % (ref 43–77)
Platelets: 266 10*3/uL (ref 150–400)
RBC: 4.92 MIL/uL (ref 3.87–5.11)
RDW: 12.7 % (ref 11.5–15.5)
WBC: 8.1 10*3/uL (ref 4.0–10.5)

## 2015-03-12 MED ORDER — SODIUM CHLORIDE 0.9 % IV BOLUS (SEPSIS)
1000.0000 mL | Freq: Once | INTRAVENOUS | Status: AC
  Administered 2015-03-12: 1000 mL via INTRAVENOUS

## 2015-03-12 NOTE — Discharge Instructions (Signed)
Tests were good.  Rest.  Increase fluids.  Tylenol or ibuprofen for pain

## 2015-03-12 NOTE — ED Notes (Signed)
Patient verbalizes understanding of discharge instructions, home care and follow up care if needed. Patient out of department at this time with family. 

## 2015-03-12 NOTE — ED Notes (Signed)
EMS reports pt was at work and had  Near syncopal episode.  Says witnesses say that pt was standing talking to co worker and suddenly face turned red and almost passed out.  Staff helped pt to the break room to lay on the couch.   EMS says this morning pt tripped over a box and fell.  Reports pt did not fall with the near syncopal episode but ems put pt in c collar because of c/o neck pain.  Pt had a cortisone shot Tuesday for shoulder pain.  CBG 120 per ems.  Pt alert and oriented, c/o neck pain.  Pt also c/o pain in left hip and left knee from fall earlier today.    Reports multiple neck surgeries.  Also GFR was 44 recently.

## 2015-03-12 NOTE — ED Provider Notes (Signed)
CSN: [id number]     Arrival date & time 03/12/15  1509 History  This chart was scribed for Alyssa Christen, MD by Stephania Fragmin, ED Scribe. This patient was seen in room APA15/APA15 and the patient's care was started at 3:28 PM.   Chief Complaint  Patient presents with  . Loss of Consciousness   The history is provided by the patient and the EMS personnel. No language interpreter was used.     HPI Comments: [name] is a 59 y.o. female who presents to the Emergency Department brought in by ambulance for a witnessed near-syncopal episode that occurred at work PTA. Per EMS, patient was standing talking to coworkers when patient's face turned red and she almost fainted. Her coworkers helped her lay down on the couch. Patient states she remembers falling, and twisting her head and neck. However, it is unclear whether patient is referring to the near syncopal episode or another incident that occurred 2 hours earlier, when patient tripped over a box and fell, per EMS. Patient complains of neck, left hip, and left knee pain. She notes a history of neck surgery x 1, back surgery x 5, and chronic back pain. Patient states she ate a salad today. She denies chest or abdominal pain.   Past Medical History  Diagnosis Date  . Celiac disease   . Arthritis   . Osteoarthritis (arthritis due to wear and tear of joints)   . Fibromyalgia   . Diverticulitis   . Renal disorder    Past Surgical History  Procedure Laterality Date  . Back surgery      Patient states that she has had 5 surgeries  . Neck surgery      Cerivcal Fusion  . Appendectomy    . Colonoscopy      2011  . Upper gastrointestinal endoscopy  2011  . Tonsillectomy and adenoidectomy    . Neck surgery     Family History  Problem Relation Age of Onset  . Heart disease Mother    History  Substance Use Topics  . Smoking status: Former Smoker    Types: Cigarettes    Start date: 04/24/2012  . Smokeless tobacco: Never Used  . Alcohol Use:  No   OB History    No data available     Review of Systems  Cardiovascular: Negative for chest pain.  Gastrointestinal: Negative for abdominal pain.  Musculoskeletal: Positive for arthralgias (left hip and knee pain) and neck pain.  Neurological: Positive for syncope (near syncope, per EMS).  All other systems reviewed and are negative.   Allergies  Cefazolin; Codeine; Meperidine hcl; Morphine; Penicillins; and Sulfonamide derivatives  Home Medications   Prior to Admission medications   Medication Sig Start Date End Date Taking? Authorizing Provider  ALPRAZolam Duanne Moron) 0.5 MG tablet Take 0.5 mg by mouth at bedtime as needed for sleep.   Yes Historical Provider, MD  Buprenorphine 15 MCG/HR PTWK Place onto the skin.   Yes Historical Provider, MD  Cholecalciferol (VITAMIN D3) 3000 UNITS TABS Take by mouth. Patient states hat she takes every other day   Yes Historical Provider, MD  DULoxetine (CYMBALTA) 30 MG capsule Take 60 mg by mouth daily.    Yes Historical Provider, MD  levocetirizine (XYZAL) 5 MG tablet Take 5 mg by mouth at bedtime. 03/11/15  Yes Historical Provider, MD  LINZESS 145 MCG CAPS capsule Take 145 mcg by mouth every 3 (three) days. 02/18/15  Yes Historical Provider, MD  magnesium 30 MG tablet  Take 30 mg by mouth daily.   Yes Historical Provider, MD  metFORMIN (GLUCOPHAGE) 500 MG tablet Take 500 mg by mouth 2 (two) times daily. 02/14/15  Yes Historical Provider, MD  potassium chloride SA (K-DUR,KLOR-CON) 20 MEQ tablet Take 20 mEq by mouth daily.   Yes Historical Provider, MD  pyridOXINE (VITAMIN B-6) 100 MG tablet Take 100 mg by mouth daily.   Yes Historical Provider, MD  QUEtiapine (SEROQUEL) 100 MG tablet Take 100 mg by mouth at bedtime.   Yes Historical Provider, MD  thiamine (VITAMIN B-1) 100 MG tablet Take 100 mg by mouth daily.   Yes Historical Provider, MD  tiZANidine (ZANAFLEX) 4 MG capsule Take 4 mg by mouth at bedtime.   Yes Historical Provider, MD  traMADol  (ULTRAM) 50 MG tablet Take 50 mg by mouth every 6 (six) hours as needed for moderate pain or severe pain.   Yes Historical Provider, MD  triamterene-hydrochlorothiazide (MAXZIDE) 75-50 MG per tablet Take 0.5 tablets by mouth daily.    Yes Historical Provider, MD  vitamin B-12 (CYANOCOBALAMIN) 100 MCG tablet Take 100 mcg by mouth daily.   Yes Historical Provider, MD  diazepam (VALIUM) 5 MG tablet Take 1 tablet (5 mg total) by mouth every 8 (eight) hours as needed for muscle spasms. Patient not taking: Reported on 03/12/2015 11/26/14   Kristen N Ward, DO  ibuprofen (ADVIL,MOTRIN) 800 MG tablet Take 1 tablet (800 mg total) by mouth every 8 (eight) hours as needed for mild pain. Patient not taking: Reported on 03/12/2015 11/26/14   Kristen N Ward, DO  ondansetron (ZOFRAN ODT) 4 MG disintegrating tablet Take 1 tablet (4 mg total) by mouth every 8 (eight) hours as needed for nausea or vomiting. Patient not taking: Reported on 03/12/2015 11/26/14   Delice Bison Ward, DO  oxyCODONE-acetaminophen (PERCOCET/ROXICET) 5-325 MG per tablet Take 1 tablet by mouth every 6 (six) hours as needed. Patient not taking: Reported on 03/12/2015 11/26/14   Kristen N Ward, DO   BP 153/78 mmHg  Pulse 101  Temp(Src) 97.7 F (36.5 C) (Oral)  Resp 18  Ht 5\' 3"  (1.6 m)  Wt 145 lb (65.772 kg)  BMI 25.69 kg/m2  SpO2 99% Physical Exam  Constitutional: She is oriented to person, place, and time. She appears well-developed and well-nourished.  HENT:  Head: Normocephalic and atraumatic.  Eyes: Conjunctivae and EOM are normal. Pupils are equal, round, and reactive to light.  Neck: Normal range of motion. Neck supple.  Cardiovascular: Normal rate and regular rhythm.   Pulmonary/Chest: Effort normal and breath sounds normal.  Abdominal: Soft. Bowel sounds are normal.  Musculoskeletal: Normal range of motion. She exhibits tenderness.  Left hip tenderness.  Neurological: She is alert and oriented to person, place, and time.  Skin: Skin  is warm and dry.  Psychiatric: She has a normal mood and affect. Her behavior is normal.  Nursing note and vitals reviewed.   ED Course  Procedures (including critical care time)  DIAGNOSTIC STUDIES: Oxygen Saturation is 96% on room air, normal by my interpretation.    COORDINATION OF CARE: 3:38 PM - Discussed treatment plan with pt at bedside which includes blood tests and XRs, and pt agreed to plan.   Labs Review Labs Reviewed  BASIC METABOLIC PANEL - Abnormal; Notable for the following:    BUN 34 (*)    GFR calc non Af Amer 65 (*)    GFR calc Af Amer 76 (*)    All other components within normal limits  CBC WITH DIFFERENTIAL/PLATELET    Imaging Review Ct Head Wo Contrast  03/12/2015   CLINICAL DATA:  Near syncopal event, recent trip and fall, initial encounter  EXAM: CT HEAD WITHOUT CONTRAST  CT CERVICAL SPINE WITHOUT CONTRAST  TECHNIQUE: Multidetector CT imaging of the head and cervical spine was performed following the standard protocol without intravenous contrast. Multiplanar CT image reconstructions of the cervical spine were also generated.  COMPARISON:  None.  FINDINGS: CT HEAD FINDINGS  The bony calvarium is intact. No gross soft tissue abnormality is seen. No findings to suggest acute hemorrhage, acute infarction or space-occupying mass lesion are noted.  CT CERVICAL SPINE FINDINGS  Seven cervical segments are well visualized. Vertebral body height is well maintained. Disc space narrowing with osteophytic changes are noted at C4-5, C5-6, C6-7 and C7-T1. Facet hypertrophic changes are noted at multiple levels. No acute fracture or acute facet abnormality is noted. The surrounding soft tissues show no acute abnormality. Some carotid calcifications are seen.  IMPRESSION: CT of the head:  No acute intracranial abnormality noted.  CT of the cervical spine: Degenerative changes without acute abnormality.   Electronically Signed   By: Inez Catalina M.D.   On: 03/12/2015 16:40   Ct  Cervical Spine Wo Contrast  03/12/2015   CLINICAL DATA:  Near syncopal event, recent trip and fall, initial encounter  EXAM: CT HEAD WITHOUT CONTRAST  CT CERVICAL SPINE WITHOUT CONTRAST  TECHNIQUE: Multidetector CT imaging of the head and cervical spine was performed following the standard protocol without intravenous contrast. Multiplanar CT image reconstructions of the cervical spine were also generated.  COMPARISON:  None.  FINDINGS: CT HEAD FINDINGS  The bony calvarium is intact. No gross soft tissue abnormality is seen. No findings to suggest acute hemorrhage, acute infarction or space-occupying mass lesion are noted.  CT CERVICAL SPINE FINDINGS  Seven cervical segments are well visualized. Vertebral body height is well maintained. Disc space narrowing with osteophytic changes are noted at C4-5, C5-6, C6-7 and C7-T1. Facet hypertrophic changes are noted at multiple levels. No acute fracture or acute facet abnormality is noted. The surrounding soft tissues show no acute abnormality. Some carotid calcifications are seen.  IMPRESSION: CT of the head:  No acute intracranial abnormality noted.  CT of the cervical spine: Degenerative changes without acute abnormality.   Electronically Signed   By: Inez Catalina M.D.   On: 03/12/2015 16:40   Dg Hip Unilat With Pelvis 2-3 Views Left  03/12/2015   CLINICAL DATA:  Tripped and fell over box with left hip pain, initial encounter  EXAM: LEFT HIP (WITH PELVIS) 2-3 VIEWS  COMPARISON:  None.  FINDINGS: No acute fracture or dislocation is noted. No gross soft tissue abnormality is seen. Some injection granulomas are noted within the buttocks bilaterally.  IMPRESSION: No acute abnormality noted.   Electronically Signed   By: Inez Catalina M.D.   On: 03/12/2015 16:28     EKG Interpretation   Date/Time:  Thursday March 12 2015 15:13:54 EDT Ventricular Rate:  92 PR Interval:  145 QRS Duration: 85 QT Interval:  359 QTC Calculation: 444 R Axis:   83 Text  Interpretation:  Sinus rhythm Borderline right axis deviation Minimal  ST depression, lateral leads Confirmed by Kerston Landeck  MD, Vickki Igou ([id number]) on  03/12/2015 4:36:41 PM      MDM   Final diagnoses:  Fall  Syncope, unspecified syncope type  Minor head injury, initial encounter  Neck pain   Patient is in no acute distress.  CT head, CT cervical spine, plain films of left hip all negative for acute injuries.  EKG was normal. Hemoglobin and glucose normal.  Chest findings with the patient and her significant other. She will follow-up with her primary care doctor.  I personally performed the services described in this documentation, which was scribed in my presence. The recorded information has been reviewed and is accurate.     Alyssa Christen, MD 03/12/15 2025

## 2015-08-10 ENCOUNTER — Other Ambulatory Visit: Payer: Self-pay | Admitting: Adult Health

## 2015-08-14 ENCOUNTER — Other Ambulatory Visit (HOSPITAL_COMMUNITY): Payer: Self-pay | Admitting: Internal Medicine

## 2015-08-14 DIAGNOSIS — Z1231 Encounter for screening mammogram for malignant neoplasm of breast: Secondary | ICD-10-CM

## 2015-08-17 ENCOUNTER — Ambulatory Visit (HOSPITAL_COMMUNITY)
Admission: RE | Admit: 2015-08-17 | Discharge: 2015-08-17 | Disposition: A | Discharge status: Home / Self Care | Payer: BLUE CROSS/BLUE SHIELD | Source: Ambulatory Visit | Attending: Internal Medicine | Admitting: Internal Medicine

## 2015-08-17 DIAGNOSIS — Z1231 Encounter for screening mammogram for malignant neoplasm of breast: Secondary | ICD-10-CM

## 2015-10-21 ENCOUNTER — Encounter (INDEPENDENT_AMBULATORY_CARE_PROVIDER_SITE_OTHER): Payer: Self-pay | Admitting: *Deleted

## 2016-03-22 DIAGNOSIS — D225 Melanocytic nevi of trunk: Secondary | ICD-10-CM | POA: Diagnosis not present

## 2016-03-22 DIAGNOSIS — B078 Other viral warts: Secondary | ICD-10-CM | POA: Diagnosis not present

## 2016-03-22 DIAGNOSIS — Z85820 Personal history of malignant melanoma of skin: Secondary | ICD-10-CM | POA: Diagnosis not present

## 2016-03-22 DIAGNOSIS — L570 Actinic keratosis: Secondary | ICD-10-CM | POA: Diagnosis not present

## 2016-03-22 DIAGNOSIS — L814 Other melanin hyperpigmentation: Secondary | ICD-10-CM | POA: Diagnosis not present

## 2016-03-22 DIAGNOSIS — D485 Neoplasm of uncertain behavior of skin: Secondary | ICD-10-CM | POA: Diagnosis not present

## 2016-03-22 DIAGNOSIS — L918 Other hypertrophic disorders of the skin: Secondary | ICD-10-CM | POA: Diagnosis not present

## 2016-04-18 DIAGNOSIS — L089 Local infection of the skin and subcutaneous tissue, unspecified: Secondary | ICD-10-CM | POA: Diagnosis not present

## 2016-04-18 DIAGNOSIS — D485 Neoplasm of uncertain behavior of skin: Secondary | ICD-10-CM | POA: Diagnosis not present

## 2016-06-06 DIAGNOSIS — E119 Type 2 diabetes mellitus without complications: Secondary | ICD-10-CM | POA: Diagnosis not present

## 2016-06-06 DIAGNOSIS — E782 Mixed hyperlipidemia: Secondary | ICD-10-CM | POA: Diagnosis not present

## 2016-06-09 DIAGNOSIS — I10 Essential (primary) hypertension: Secondary | ICD-10-CM | POA: Diagnosis not present

## 2016-06-09 DIAGNOSIS — E119 Type 2 diabetes mellitus without complications: Secondary | ICD-10-CM | POA: Diagnosis not present

## 2016-06-09 DIAGNOSIS — E782 Mixed hyperlipidemia: Secondary | ICD-10-CM | POA: Diagnosis not present

## 2016-06-09 DIAGNOSIS — E6609 Other obesity due to excess calories: Secondary | ICD-10-CM | POA: Diagnosis not present

## 2016-06-22 DIAGNOSIS — N390 Urinary tract infection, site not specified: Secondary | ICD-10-CM | POA: Diagnosis not present

## 2016-06-27 DIAGNOSIS — N390 Urinary tract infection, site not specified: Secondary | ICD-10-CM | POA: Diagnosis not present

## 2016-06-29 DIAGNOSIS — N390 Urinary tract infection, site not specified: Secondary | ICD-10-CM | POA: Diagnosis not present

## 2016-06-29 DIAGNOSIS — M79605 Pain in left leg: Secondary | ICD-10-CM | POA: Diagnosis not present

## 2016-08-17 DIAGNOSIS — M5440 Lumbago with sciatica, unspecified side: Secondary | ICD-10-CM | POA: Diagnosis not present

## 2016-09-23 DIAGNOSIS — H43812 Vitreous degeneration, left eye: Secondary | ICD-10-CM | POA: Diagnosis not present

## 2016-10-03 DIAGNOSIS — E782 Mixed hyperlipidemia: Secondary | ICD-10-CM | POA: Diagnosis not present

## 2016-10-03 DIAGNOSIS — E119 Type 2 diabetes mellitus without complications: Secondary | ICD-10-CM | POA: Diagnosis not present

## 2016-10-04 DIAGNOSIS — I10 Essential (primary) hypertension: Secondary | ICD-10-CM | POA: Diagnosis not present

## 2016-10-04 DIAGNOSIS — F411 Generalized anxiety disorder: Secondary | ICD-10-CM | POA: Diagnosis not present

## 2016-10-04 DIAGNOSIS — E119 Type 2 diabetes mellitus without complications: Secondary | ICD-10-CM | POA: Diagnosis not present

## 2016-10-04 DIAGNOSIS — Z23 Encounter for immunization: Secondary | ICD-10-CM | POA: Diagnosis not present

## 2016-10-04 DIAGNOSIS — E782 Mixed hyperlipidemia: Secondary | ICD-10-CM | POA: Diagnosis not present

## 2016-12-21 DIAGNOSIS — R05 Cough: Secondary | ICD-10-CM | POA: Diagnosis not present

## 2016-12-21 DIAGNOSIS — J060 Acute laryngopharyngitis: Secondary | ICD-10-CM | POA: Diagnosis not present

## 2016-12-21 DIAGNOSIS — Z6826 Body mass index (BMI) 26.0-26.9, adult: Secondary | ICD-10-CM | POA: Diagnosis not present

## 2017-01-13 DIAGNOSIS — Z01419 Encounter for gynecological examination (general) (routine) without abnormal findings: Secondary | ICD-10-CM | POA: Diagnosis not present

## 2017-01-13 DIAGNOSIS — Z6825 Body mass index (BMI) 25.0-25.9, adult: Secondary | ICD-10-CM | POA: Diagnosis not present

## 2017-01-20 ENCOUNTER — Encounter: Payer: Self-pay | Admitting: Internal Medicine

## 2017-01-25 DIAGNOSIS — E782 Mixed hyperlipidemia: Secondary | ICD-10-CM | POA: Diagnosis not present

## 2017-01-25 DIAGNOSIS — E119 Type 2 diabetes mellitus without complications: Secondary | ICD-10-CM | POA: Diagnosis not present

## 2017-01-31 DIAGNOSIS — E119 Type 2 diabetes mellitus without complications: Secondary | ICD-10-CM | POA: Diagnosis not present

## 2017-01-31 DIAGNOSIS — E782 Mixed hyperlipidemia: Secondary | ICD-10-CM | POA: Diagnosis not present

## 2017-01-31 DIAGNOSIS — F411 Generalized anxiety disorder: Secondary | ICD-10-CM | POA: Diagnosis not present

## 2017-01-31 DIAGNOSIS — I10 Essential (primary) hypertension: Secondary | ICD-10-CM | POA: Diagnosis not present

## 2017-03-16 DIAGNOSIS — E119 Type 2 diabetes mellitus without complications: Secondary | ICD-10-CM | POA: Diagnosis not present

## 2017-03-16 DIAGNOSIS — Z6827 Body mass index (BMI) 27.0-27.9, adult: Secondary | ICD-10-CM | POA: Diagnosis not present

## 2017-03-16 DIAGNOSIS — R109 Unspecified abdominal pain: Secondary | ICD-10-CM | POA: Diagnosis not present

## 2017-04-26 DIAGNOSIS — M461 Sacroiliitis, not elsewhere classified: Secondary | ICD-10-CM | POA: Diagnosis not present

## 2017-04-26 DIAGNOSIS — E119 Type 2 diabetes mellitus without complications: Secondary | ICD-10-CM | POA: Diagnosis not present

## 2017-04-26 DIAGNOSIS — M545 Low back pain: Secondary | ICD-10-CM | POA: Diagnosis not present

## 2017-04-26 DIAGNOSIS — M5430 Sciatica, unspecified side: Secondary | ICD-10-CM | POA: Diagnosis not present

## 2017-06-12 DIAGNOSIS — Z713 Dietary counseling and surveillance: Secondary | ICD-10-CM | POA: Diagnosis not present

## 2017-06-12 DIAGNOSIS — Z6827 Body mass index (BMI) 27.0-27.9, adult: Secondary | ICD-10-CM | POA: Diagnosis not present

## 2017-06-12 DIAGNOSIS — E119 Type 2 diabetes mellitus without complications: Secondary | ICD-10-CM | POA: Diagnosis not present

## 2017-07-13 DIAGNOSIS — E119 Type 2 diabetes mellitus without complications: Secondary | ICD-10-CM | POA: Diagnosis not present

## 2017-07-13 DIAGNOSIS — E6609 Other obesity due to excess calories: Secondary | ICD-10-CM | POA: Diagnosis not present

## 2017-07-13 DIAGNOSIS — Z6827 Body mass index (BMI) 27.0-27.9, adult: Secondary | ICD-10-CM | POA: Diagnosis not present

## 2017-08-14 DIAGNOSIS — E6609 Other obesity due to excess calories: Secondary | ICD-10-CM | POA: Diagnosis not present

## 2017-08-14 DIAGNOSIS — Z713 Dietary counseling and surveillance: Secondary | ICD-10-CM | POA: Diagnosis not present

## 2017-08-14 DIAGNOSIS — Z6825 Body mass index (BMI) 25.0-25.9, adult: Secondary | ICD-10-CM | POA: Diagnosis not present

## 2017-09-11 DIAGNOSIS — Z6825 Body mass index (BMI) 25.0-25.9, adult: Secondary | ICD-10-CM | POA: Diagnosis not present

## 2017-09-11 DIAGNOSIS — Z23 Encounter for immunization: Secondary | ICD-10-CM | POA: Diagnosis not present

## 2017-09-11 DIAGNOSIS — Z713 Dietary counseling and surveillance: Secondary | ICD-10-CM | POA: Diagnosis not present

## 2017-09-26 DIAGNOSIS — Z23 Encounter for immunization: Secondary | ICD-10-CM | POA: Diagnosis not present

## 2017-09-26 DIAGNOSIS — Z6825 Body mass index (BMI) 25.0-25.9, adult: Secondary | ICD-10-CM | POA: Diagnosis not present

## 2017-09-26 DIAGNOSIS — Z713 Dietary counseling and surveillance: Secondary | ICD-10-CM | POA: Diagnosis not present

## 2017-10-12 DIAGNOSIS — E6609 Other obesity due to excess calories: Secondary | ICD-10-CM | POA: Diagnosis not present

## 2017-10-16 DIAGNOSIS — D485 Neoplasm of uncertain behavior of skin: Secondary | ICD-10-CM | POA: Diagnosis not present

## 2017-10-16 DIAGNOSIS — D225 Melanocytic nevi of trunk: Secondary | ICD-10-CM | POA: Diagnosis not present

## 2017-10-16 DIAGNOSIS — L738 Other specified follicular disorders: Secondary | ICD-10-CM | POA: Diagnosis not present

## 2017-10-16 DIAGNOSIS — L570 Actinic keratosis: Secondary | ICD-10-CM | POA: Diagnosis not present

## 2017-10-16 DIAGNOSIS — L821 Other seborrheic keratosis: Secondary | ICD-10-CM | POA: Diagnosis not present

## 2017-10-16 DIAGNOSIS — Z85820 Personal history of malignant melanoma of skin: Secondary | ICD-10-CM | POA: Diagnosis not present

## 2017-11-22 DIAGNOSIS — Z713 Dietary counseling and surveillance: Secondary | ICD-10-CM | POA: Diagnosis not present

## 2017-11-29 DIAGNOSIS — Z9181 History of falling: Secondary | ICD-10-CM | POA: Diagnosis not present

## 2017-12-06 DIAGNOSIS — H02839 Dermatochalasis of unspecified eye, unspecified eyelid: Secondary | ICD-10-CM | POA: Diagnosis not present

## 2017-12-16 DIAGNOSIS — J069 Acute upper respiratory infection, unspecified: Secondary | ICD-10-CM | POA: Diagnosis not present

## 2017-12-25 DIAGNOSIS — R062 Wheezing: Secondary | ICD-10-CM | POA: Diagnosis not present

## 2017-12-25 DIAGNOSIS — R05 Cough: Secondary | ICD-10-CM | POA: Diagnosis not present

## 2018-01-15 DIAGNOSIS — I10 Essential (primary) hypertension: Secondary | ICD-10-CM | POA: Diagnosis not present

## 2018-01-15 DIAGNOSIS — M5440 Lumbago with sciatica, unspecified side: Secondary | ICD-10-CM | POA: Diagnosis not present

## 2018-01-16 DIAGNOSIS — H57813 Brow ptosis, bilateral: Secondary | ICD-10-CM | POA: Diagnosis not present

## 2018-02-15 DIAGNOSIS — F411 Generalized anxiety disorder: Secondary | ICD-10-CM | POA: Diagnosis not present

## 2018-03-14 DIAGNOSIS — K581 Irritable bowel syndrome with constipation: Secondary | ICD-10-CM | POA: Diagnosis not present

## 2018-03-14 DIAGNOSIS — H60511 Acute actinic otitis externa, right ear: Secondary | ICD-10-CM | POA: Diagnosis not present

## 2018-03-14 DIAGNOSIS — Z713 Dietary counseling and surveillance: Secondary | ICD-10-CM | POA: Diagnosis not present

## 2018-03-14 DIAGNOSIS — F339 Major depressive disorder, recurrent, unspecified: Secondary | ICD-10-CM | POA: Diagnosis not present

## 2018-05-11 DIAGNOSIS — N390 Urinary tract infection, site not specified: Secondary | ICD-10-CM | POA: Diagnosis not present

## 2018-05-11 DIAGNOSIS — Z6828 Body mass index (BMI) 28.0-28.9, adult: Secondary | ICD-10-CM | POA: Diagnosis not present

## 2018-05-11 DIAGNOSIS — F339 Major depressive disorder, recurrent, unspecified: Secondary | ICD-10-CM | POA: Diagnosis not present

## 2018-06-05 DIAGNOSIS — N898 Other specified noninflammatory disorders of vagina: Secondary | ICD-10-CM | POA: Diagnosis not present

## 2018-06-05 DIAGNOSIS — E1169 Type 2 diabetes mellitus with other specified complication: Secondary | ICD-10-CM | POA: Diagnosis not present

## 2018-06-05 DIAGNOSIS — E6609 Other obesity due to excess calories: Secondary | ICD-10-CM | POA: Diagnosis not present

## 2018-06-05 DIAGNOSIS — L900 Lichen sclerosus et atrophicus: Secondary | ICD-10-CM | POA: Diagnosis not present

## 2018-06-12 DIAGNOSIS — Z6828 Body mass index (BMI) 28.0-28.9, adult: Secondary | ICD-10-CM | POA: Diagnosis not present

## 2018-06-12 DIAGNOSIS — E6609 Other obesity due to excess calories: Secondary | ICD-10-CM | POA: Diagnosis not present

## 2018-06-12 DIAGNOSIS — E1169 Type 2 diabetes mellitus with other specified complication: Secondary | ICD-10-CM | POA: Diagnosis not present

## 2018-07-10 DIAGNOSIS — E782 Mixed hyperlipidemia: Secondary | ICD-10-CM | POA: Diagnosis not present

## 2018-07-10 DIAGNOSIS — I10 Essential (primary) hypertension: Secondary | ICD-10-CM | POA: Diagnosis not present

## 2018-07-10 DIAGNOSIS — E1169 Type 2 diabetes mellitus with other specified complication: Secondary | ICD-10-CM | POA: Diagnosis not present

## 2018-07-10 DIAGNOSIS — M797 Fibromyalgia: Secondary | ICD-10-CM | POA: Diagnosis not present

## 2018-07-17 DIAGNOSIS — F411 Generalized anxiety disorder: Secondary | ICD-10-CM | POA: Diagnosis not present

## 2018-07-17 DIAGNOSIS — I10 Essential (primary) hypertension: Secondary | ICD-10-CM | POA: Diagnosis not present

## 2018-07-17 DIAGNOSIS — F339 Major depressive disorder, recurrent, unspecified: Secondary | ICD-10-CM | POA: Diagnosis not present

## 2018-07-17 DIAGNOSIS — E1169 Type 2 diabetes mellitus with other specified complication: Secondary | ICD-10-CM | POA: Diagnosis not present

## 2018-07-18 ENCOUNTER — Encounter (INDEPENDENT_AMBULATORY_CARE_PROVIDER_SITE_OTHER): Payer: Self-pay | Admitting: *Deleted

## 2018-08-30 DIAGNOSIS — M79644 Pain in right finger(s): Secondary | ICD-10-CM | POA: Diagnosis not present

## 2018-08-30 DIAGNOSIS — M65331 Trigger finger, right middle finger: Secondary | ICD-10-CM | POA: Diagnosis not present

## 2018-08-30 DIAGNOSIS — M79646 Pain in unspecified finger(s): Secondary | ICD-10-CM | POA: Insufficient documentation

## 2018-09-04 DIAGNOSIS — Z23 Encounter for immunization: Secondary | ICD-10-CM | POA: Diagnosis not present

## 2018-09-11 ENCOUNTER — Other Ambulatory Visit (HOSPITAL_COMMUNITY): Payer: Self-pay | Admitting: Internal Medicine

## 2018-09-11 DIAGNOSIS — Z1231 Encounter for screening mammogram for malignant neoplasm of breast: Secondary | ICD-10-CM

## 2018-09-27 ENCOUNTER — Ambulatory Visit (HOSPITAL_COMMUNITY)
Admission: RE | Admit: 2018-09-27 | Discharge: 2018-09-27 | Disposition: A | Discharge status: Home / Self Care | Payer: BLUE CROSS/BLUE SHIELD | Source: Ambulatory Visit | Attending: Internal Medicine | Admitting: Internal Medicine

## 2018-09-27 ENCOUNTER — Encounter (HOSPITAL_COMMUNITY): Payer: Self-pay

## 2018-09-27 DIAGNOSIS — Z1231 Encounter for screening mammogram for malignant neoplasm of breast: Secondary | ICD-10-CM | POA: Diagnosis not present

## 2018-10-19 DIAGNOSIS — E782 Mixed hyperlipidemia: Secondary | ICD-10-CM | POA: Diagnosis not present

## 2018-10-19 DIAGNOSIS — M545 Low back pain: Secondary | ICD-10-CM | POA: Diagnosis not present

## 2018-10-19 DIAGNOSIS — E663 Overweight: Secondary | ICD-10-CM | POA: Diagnosis not present

## 2018-10-19 DIAGNOSIS — M791 Myalgia, unspecified site: Secondary | ICD-10-CM | POA: Diagnosis not present

## 2018-10-23 DIAGNOSIS — E782 Mixed hyperlipidemia: Secondary | ICD-10-CM | POA: Diagnosis not present

## 2018-10-23 DIAGNOSIS — M545 Low back pain: Secondary | ICD-10-CM | POA: Diagnosis not present

## 2018-10-23 DIAGNOSIS — E6609 Other obesity due to excess calories: Secondary | ICD-10-CM | POA: Diagnosis not present

## 2018-10-23 DIAGNOSIS — E1169 Type 2 diabetes mellitus with other specified complication: Secondary | ICD-10-CM | POA: Diagnosis not present

## 2019-01-10 DIAGNOSIS — E1169 Type 2 diabetes mellitus with other specified complication: Secondary | ICD-10-CM | POA: Diagnosis not present

## 2019-01-10 DIAGNOSIS — E782 Mixed hyperlipidemia: Secondary | ICD-10-CM | POA: Diagnosis not present

## 2019-01-10 DIAGNOSIS — I10 Essential (primary) hypertension: Secondary | ICD-10-CM | POA: Diagnosis not present

## 2019-01-15 DIAGNOSIS — I10 Essential (primary) hypertension: Secondary | ICD-10-CM | POA: Diagnosis not present

## 2019-01-15 DIAGNOSIS — E118 Type 2 diabetes mellitus with unspecified complications: Secondary | ICD-10-CM | POA: Diagnosis not present

## 2019-01-15 DIAGNOSIS — E782 Mixed hyperlipidemia: Secondary | ICD-10-CM | POA: Diagnosis not present

## 2019-01-15 DIAGNOSIS — M791 Myalgia, unspecified site: Secondary | ICD-10-CM | POA: Diagnosis not present

## 2019-02-26 DIAGNOSIS — L570 Actinic keratosis: Secondary | ICD-10-CM | POA: Diagnosis not present

## 2019-02-26 DIAGNOSIS — Z85820 Personal history of malignant melanoma of skin: Secondary | ICD-10-CM | POA: Diagnosis not present

## 2019-02-26 DIAGNOSIS — L918 Other hypertrophic disorders of the skin: Secondary | ICD-10-CM | POA: Diagnosis not present

## 2019-02-26 DIAGNOSIS — L738 Other specified follicular disorders: Secondary | ICD-10-CM | POA: Diagnosis not present

## 2019-02-26 DIAGNOSIS — L853 Xerosis cutis: Secondary | ICD-10-CM | POA: Diagnosis not present

## 2019-04-19 IMAGING — MG DIGITAL SCREENING BILATERAL MAMMOGRAM WITH TOMO AND CAD
8 series · 8 of 24 positions shown · non-contrast
Comparison: Previous exam(s).

ACR Breast Density Category a: The breast tissue is almost entirely
fatty.

CLINICAL DATA: Screening.

EXAM:
DIGITAL SCREENING BILATERAL MAMMOGRAM WITH TOMO AND CAD

[L CC synth-2D]
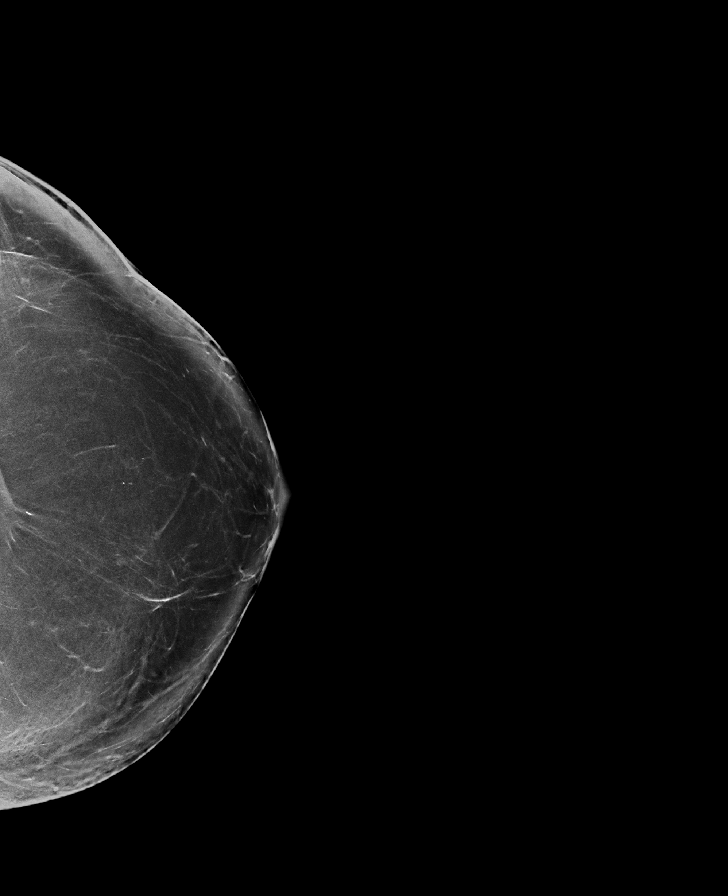

[L MLO synth-2D]
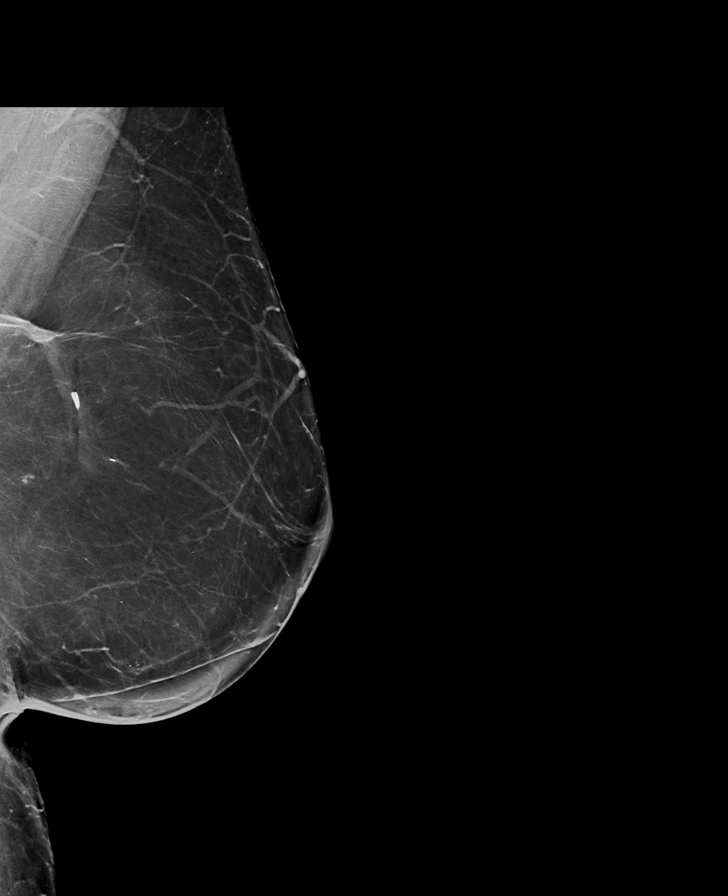

[R MLO synth-2D]
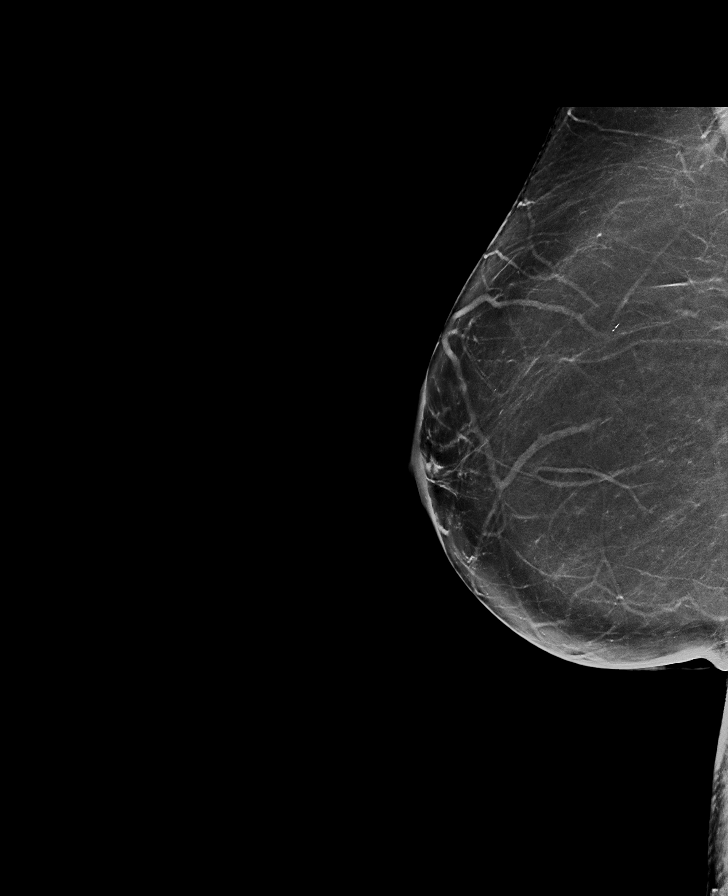

[R CC synth-2D]
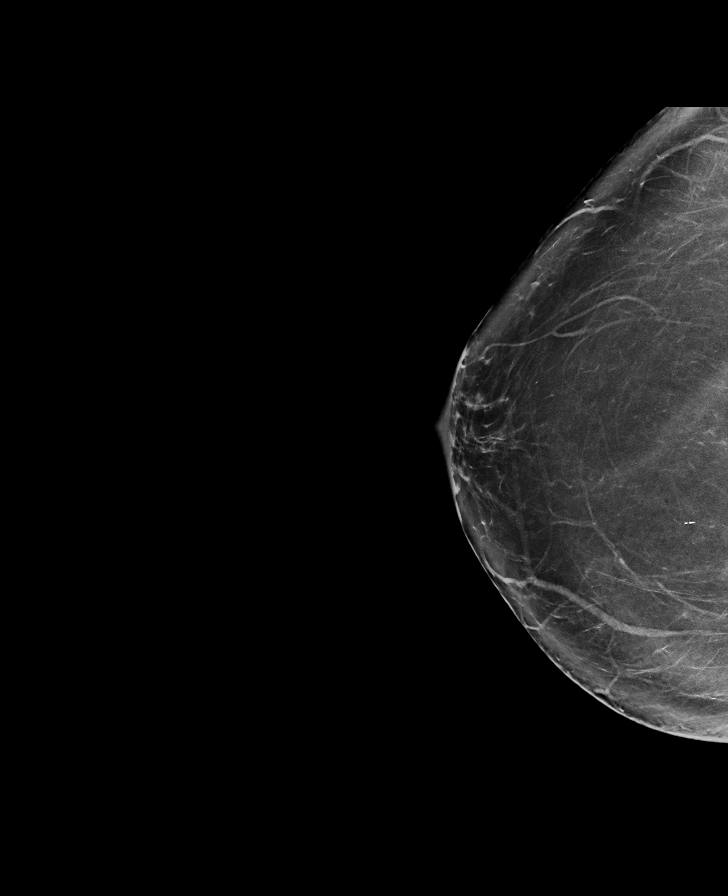

[R CC tomo · tomo slice 39/77.0]
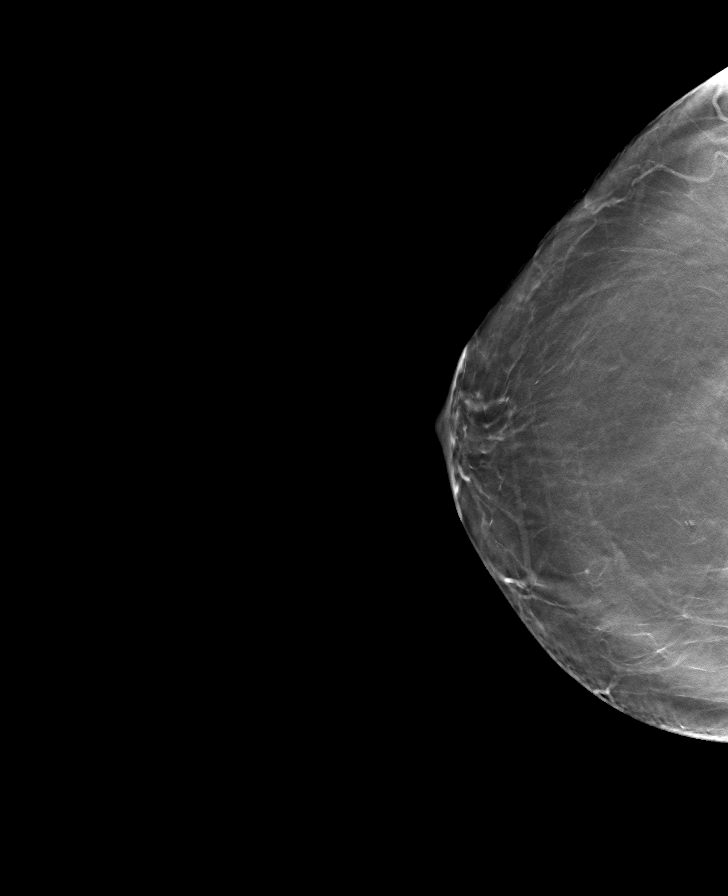

[R MLO tomo · tomo slice 41/80.0]
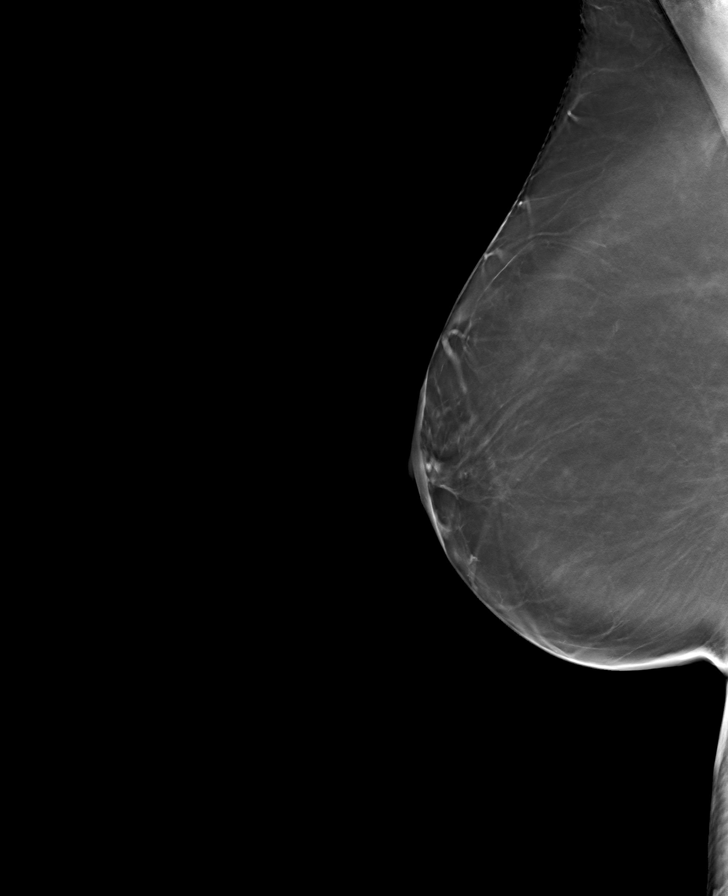

[L MLO tomo · tomo slice 45/90.0]
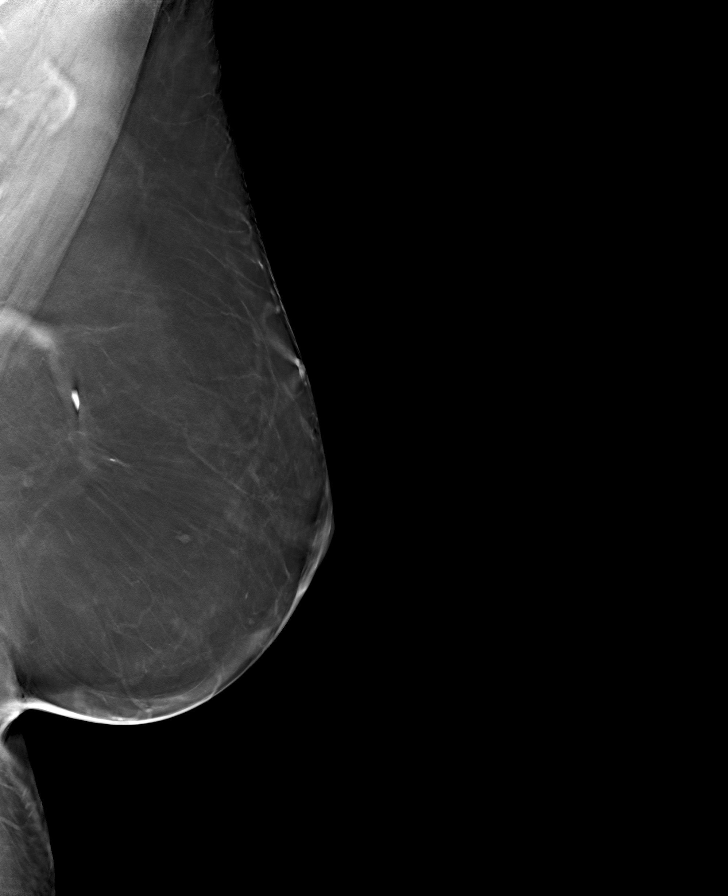

[L CC tomo · tomo slice 44/87.0]
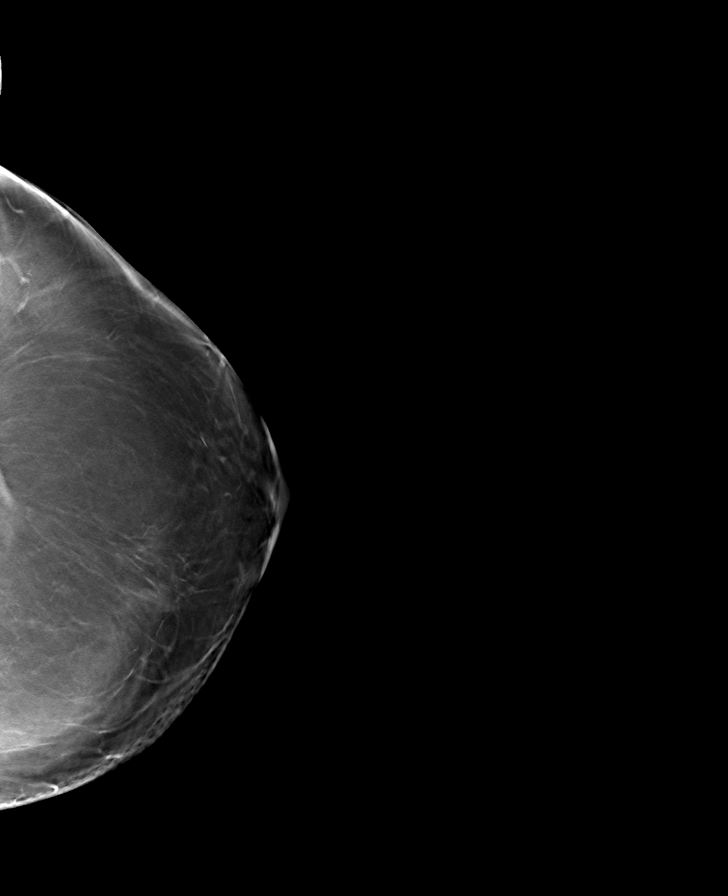

[8 of 24 positions shown; findings below may reference images not displayed]

FINDINGS: There are no findings suspicious for malignancy. Images were
processed with CAD.
IMPRESSION: No mammographic evidence of malignancy. A result letter of this
screening mammogram will be mailed directly to the patient.

RECOMMENDATION:
Screening mammogram in one year. (Code:[id number]-[id number])

BI-RADS CATEGORY  1: Negative.

## 2019-05-08 DIAGNOSIS — I10 Essential (primary) hypertension: Secondary | ICD-10-CM | POA: Diagnosis not present

## 2019-05-08 DIAGNOSIS — M791 Myalgia, unspecified site: Secondary | ICD-10-CM | POA: Diagnosis not present

## 2019-05-08 DIAGNOSIS — E782 Mixed hyperlipidemia: Secondary | ICD-10-CM | POA: Diagnosis not present

## 2019-05-08 DIAGNOSIS — E1169 Type 2 diabetes mellitus with other specified complication: Secondary | ICD-10-CM | POA: Diagnosis not present

## 2019-05-15 DIAGNOSIS — M25562 Pain in left knee: Secondary | ICD-10-CM | POA: Diagnosis not present

## 2019-05-15 DIAGNOSIS — Z9181 History of falling: Secondary | ICD-10-CM | POA: Diagnosis not present

## 2019-05-15 DIAGNOSIS — M5440 Lumbago with sciatica, unspecified side: Secondary | ICD-10-CM | POA: Diagnosis not present

## 2019-06-05 DIAGNOSIS — M79644 Pain in right finger(s): Secondary | ICD-10-CM | POA: Diagnosis not present

## 2019-06-11 DIAGNOSIS — E1169 Type 2 diabetes mellitus with other specified complication: Secondary | ICD-10-CM | POA: Diagnosis not present

## 2019-06-11 DIAGNOSIS — I10 Essential (primary) hypertension: Secondary | ICD-10-CM | POA: Diagnosis not present

## 2019-06-11 DIAGNOSIS — E782 Mixed hyperlipidemia: Secondary | ICD-10-CM | POA: Diagnosis not present

## 2019-06-18 DIAGNOSIS — E1169 Type 2 diabetes mellitus with other specified complication: Secondary | ICD-10-CM | POA: Diagnosis not present

## 2019-06-18 DIAGNOSIS — M791 Myalgia, unspecified site: Secondary | ICD-10-CM | POA: Diagnosis not present

## 2019-06-18 DIAGNOSIS — E782 Mixed hyperlipidemia: Secondary | ICD-10-CM | POA: Diagnosis not present

## 2019-06-18 DIAGNOSIS — I10 Essential (primary) hypertension: Secondary | ICD-10-CM | POA: Diagnosis not present

## 2019-07-18 DIAGNOSIS — E669 Obesity, unspecified: Secondary | ICD-10-CM | POA: Diagnosis not present

## 2019-08-02 DIAGNOSIS — M545 Low back pain: Secondary | ICD-10-CM | POA: Diagnosis not present

## 2019-08-02 DIAGNOSIS — M5440 Lumbago with sciatica, unspecified side: Secondary | ICD-10-CM | POA: Diagnosis not present

## 2019-09-17 DIAGNOSIS — N390 Urinary tract infection, site not specified: Secondary | ICD-10-CM | POA: Diagnosis not present

## 2019-09-17 DIAGNOSIS — I10 Essential (primary) hypertension: Secondary | ICD-10-CM | POA: Diagnosis not present

## 2019-09-17 DIAGNOSIS — F339 Major depressive disorder, recurrent, unspecified: Secondary | ICD-10-CM | POA: Diagnosis not present

## 2019-09-17 DIAGNOSIS — E782 Mixed hyperlipidemia: Secondary | ICD-10-CM | POA: Diagnosis not present

## 2019-09-17 DIAGNOSIS — M5430 Sciatica, unspecified side: Secondary | ICD-10-CM | POA: Diagnosis not present

## 2019-09-17 DIAGNOSIS — E1165 Type 2 diabetes mellitus with hyperglycemia: Secondary | ICD-10-CM | POA: Diagnosis not present

## 2019-09-26 DIAGNOSIS — I10 Essential (primary) hypertension: Secondary | ICD-10-CM | POA: Diagnosis not present

## 2019-09-26 DIAGNOSIS — E1169 Type 2 diabetes mellitus with other specified complication: Secondary | ICD-10-CM | POA: Diagnosis not present

## 2019-09-26 DIAGNOSIS — M791 Myalgia, unspecified site: Secondary | ICD-10-CM | POA: Diagnosis not present

## 2019-09-26 DIAGNOSIS — E782 Mixed hyperlipidemia: Secondary | ICD-10-CM | POA: Diagnosis not present

## 2019-10-10 DIAGNOSIS — E669 Obesity, unspecified: Secondary | ICD-10-CM | POA: Diagnosis not present

## 2019-10-10 DIAGNOSIS — E782 Mixed hyperlipidemia: Secondary | ICD-10-CM | POA: Diagnosis not present

## 2019-10-10 DIAGNOSIS — E118 Type 2 diabetes mellitus with unspecified complications: Secondary | ICD-10-CM | POA: Diagnosis not present

## 2019-10-10 DIAGNOSIS — K581 Irritable bowel syndrome with constipation: Secondary | ICD-10-CM | POA: Diagnosis not present

## 2019-11-28 DIAGNOSIS — K581 Irritable bowel syndrome with constipation: Secondary | ICD-10-CM | POA: Diagnosis not present

## 2019-11-28 DIAGNOSIS — G720 Drug-induced myopathy: Secondary | ICD-10-CM | POA: Diagnosis not present

## 2019-11-28 DIAGNOSIS — E669 Obesity, unspecified: Secondary | ICD-10-CM | POA: Diagnosis not present

## 2019-11-28 DIAGNOSIS — E118 Type 2 diabetes mellitus with unspecified complications: Secondary | ICD-10-CM | POA: Diagnosis not present

## 2019-11-28 DIAGNOSIS — E782 Mixed hyperlipidemia: Secondary | ICD-10-CM | POA: Diagnosis not present

## 2019-12-03 DIAGNOSIS — I10 Essential (primary) hypertension: Secondary | ICD-10-CM | POA: Diagnosis not present

## 2019-12-03 DIAGNOSIS — K581 Irritable bowel syndrome with constipation: Secondary | ICD-10-CM | POA: Diagnosis not present

## 2019-12-03 DIAGNOSIS — K900 Celiac disease: Secondary | ICD-10-CM | POA: Diagnosis not present

## 2019-12-03 DIAGNOSIS — E669 Obesity, unspecified: Secondary | ICD-10-CM | POA: Diagnosis not present

## 2019-12-03 DIAGNOSIS — G5600 Carpal tunnel syndrome, unspecified upper limb: Secondary | ICD-10-CM | POA: Diagnosis not present

## 2019-12-03 DIAGNOSIS — E118 Type 2 diabetes mellitus with unspecified complications: Secondary | ICD-10-CM | POA: Diagnosis not present

## 2019-12-03 DIAGNOSIS — L900 Lichen sclerosus et atrophicus: Secondary | ICD-10-CM | POA: Diagnosis not present

## 2019-12-03 DIAGNOSIS — M791 Myalgia, unspecified site: Secondary | ICD-10-CM | POA: Diagnosis not present

## 2019-12-03 DIAGNOSIS — E782 Mixed hyperlipidemia: Secondary | ICD-10-CM | POA: Diagnosis not present

## 2019-12-03 DIAGNOSIS — N390 Urinary tract infection, site not specified: Secondary | ICD-10-CM | POA: Diagnosis not present

## 2019-12-03 DIAGNOSIS — N898 Other specified noninflammatory disorders of vagina: Secondary | ICD-10-CM | POA: Diagnosis not present

## 2019-12-03 DIAGNOSIS — M5430 Sciatica, unspecified side: Secondary | ICD-10-CM | POA: Diagnosis not present

## 2019-12-03 DIAGNOSIS — Z9181 History of falling: Secondary | ICD-10-CM | POA: Diagnosis not present

## 2019-12-17 DIAGNOSIS — Z85820 Personal history of malignant melanoma of skin: Secondary | ICD-10-CM | POA: Diagnosis not present

## 2019-12-17 DIAGNOSIS — L720 Epidermal cyst: Secondary | ICD-10-CM | POA: Diagnosis not present

## 2019-12-17 DIAGNOSIS — L0889 Other specified local infections of the skin and subcutaneous tissue: Secondary | ICD-10-CM | POA: Diagnosis not present

## 2020-01-21 DIAGNOSIS — M2550 Pain in unspecified joint: Secondary | ICD-10-CM | POA: Diagnosis not present

## 2020-01-21 DIAGNOSIS — L4059 Other psoriatic arthropathy: Secondary | ICD-10-CM | POA: Diagnosis not present

## 2020-02-17 DIAGNOSIS — E118 Type 2 diabetes mellitus with unspecified complications: Secondary | ICD-10-CM | POA: Diagnosis not present

## 2020-02-17 DIAGNOSIS — E782 Mixed hyperlipidemia: Secondary | ICD-10-CM | POA: Diagnosis not present

## 2020-02-17 DIAGNOSIS — G720 Drug-induced myopathy: Secondary | ICD-10-CM | POA: Diagnosis not present

## 2020-02-18 DIAGNOSIS — E1169 Type 2 diabetes mellitus with other specified complication: Secondary | ICD-10-CM | POA: Diagnosis not present

## 2020-02-18 DIAGNOSIS — G894 Chronic pain syndrome: Secondary | ICD-10-CM | POA: Diagnosis not present

## 2020-02-18 DIAGNOSIS — I10 Essential (primary) hypertension: Secondary | ICD-10-CM | POA: Diagnosis not present

## 2020-02-18 DIAGNOSIS — L4059 Other psoriatic arthropathy: Secondary | ICD-10-CM | POA: Diagnosis not present

## 2020-02-27 DIAGNOSIS — K581 Irritable bowel syndrome with constipation: Secondary | ICD-10-CM | POA: Diagnosis not present

## 2020-02-27 DIAGNOSIS — M5440 Lumbago with sciatica, unspecified side: Secondary | ICD-10-CM | POA: Diagnosis not present

## 2020-02-27 DIAGNOSIS — M545 Low back pain: Secondary | ICD-10-CM | POA: Diagnosis not present

## 2020-03-06 DIAGNOSIS — R42 Dizziness and giddiness: Secondary | ICD-10-CM | POA: Diagnosis not present

## 2020-03-06 DIAGNOSIS — R000 Tachycardia, unspecified: Secondary | ICD-10-CM | POA: Diagnosis not present

## 2020-03-06 DIAGNOSIS — L4059 Other psoriatic arthropathy: Secondary | ICD-10-CM | POA: Diagnosis not present

## 2020-03-06 DIAGNOSIS — M2550 Pain in unspecified joint: Secondary | ICD-10-CM | POA: Diagnosis not present

## 2020-03-06 DIAGNOSIS — E1169 Type 2 diabetes mellitus with other specified complication: Secondary | ICD-10-CM | POA: Diagnosis not present

## 2020-03-06 DIAGNOSIS — E118 Type 2 diabetes mellitus with unspecified complications: Secondary | ICD-10-CM | POA: Diagnosis not present

## 2020-03-06 DIAGNOSIS — E1165 Type 2 diabetes mellitus with hyperglycemia: Secondary | ICD-10-CM | POA: Diagnosis not present

## 2020-03-06 DIAGNOSIS — I10 Essential (primary) hypertension: Secondary | ICD-10-CM | POA: Diagnosis not present

## 2020-03-19 DIAGNOSIS — L820 Inflamed seborrheic keratosis: Secondary | ICD-10-CM | POA: Diagnosis not present

## 2020-03-19 DIAGNOSIS — D485 Neoplasm of uncertain behavior of skin: Secondary | ICD-10-CM | POA: Diagnosis not present

## 2020-04-21 DIAGNOSIS — L821 Other seborrheic keratosis: Secondary | ICD-10-CM | POA: Diagnosis not present

## 2020-04-21 DIAGNOSIS — Z85820 Personal history of malignant melanoma of skin: Secondary | ICD-10-CM | POA: Diagnosis not present

## 2020-04-21 DIAGNOSIS — L0293 Carbuncle, unspecified: Secondary | ICD-10-CM | POA: Diagnosis not present

## 2020-04-21 DIAGNOSIS — D225 Melanocytic nevi of trunk: Secondary | ICD-10-CM | POA: Diagnosis not present

## 2020-05-12 DIAGNOSIS — M2550 Pain in unspecified joint: Secondary | ICD-10-CM | POA: Diagnosis not present

## 2020-05-12 DIAGNOSIS — L4059 Other psoriatic arthropathy: Secondary | ICD-10-CM | POA: Diagnosis not present

## 2020-05-22 DIAGNOSIS — E118 Type 2 diabetes mellitus with unspecified complications: Secondary | ICD-10-CM | POA: Diagnosis not present

## 2020-05-22 DIAGNOSIS — E1169 Type 2 diabetes mellitus with other specified complication: Secondary | ICD-10-CM | POA: Diagnosis not present

## 2020-05-22 DIAGNOSIS — E1165 Type 2 diabetes mellitus with hyperglycemia: Secondary | ICD-10-CM | POA: Diagnosis not present

## 2020-05-26 DIAGNOSIS — E782 Mixed hyperlipidemia: Secondary | ICD-10-CM | POA: Diagnosis not present

## 2020-05-26 DIAGNOSIS — I10 Essential (primary) hypertension: Secondary | ICD-10-CM | POA: Diagnosis not present

## 2020-05-26 DIAGNOSIS — E1169 Type 2 diabetes mellitus with other specified complication: Secondary | ICD-10-CM | POA: Diagnosis not present

## 2020-05-26 DIAGNOSIS — M791 Myalgia, unspecified site: Secondary | ICD-10-CM | POA: Diagnosis not present

## 2020-05-26 DIAGNOSIS — M542 Cervicalgia: Secondary | ICD-10-CM | POA: Diagnosis not present

## 2020-06-03 DIAGNOSIS — G720 Drug-induced myopathy: Secondary | ICD-10-CM | POA: Diagnosis not present

## 2020-06-03 DIAGNOSIS — E782 Mixed hyperlipidemia: Secondary | ICD-10-CM | POA: Diagnosis not present

## 2020-06-03 DIAGNOSIS — E118 Type 2 diabetes mellitus with unspecified complications: Secondary | ICD-10-CM | POA: Diagnosis not present

## 2020-06-11 DIAGNOSIS — M545 Low back pain: Secondary | ICD-10-CM | POA: Diagnosis not present

## 2020-06-11 DIAGNOSIS — L4059 Other psoriatic arthropathy: Secondary | ICD-10-CM | POA: Diagnosis not present

## 2020-06-11 DIAGNOSIS — M2550 Pain in unspecified joint: Secondary | ICD-10-CM | POA: Diagnosis not present

## 2020-06-16 DIAGNOSIS — M542 Cervicalgia: Secondary | ICD-10-CM | POA: Diagnosis not present

## 2020-06-16 DIAGNOSIS — M791 Myalgia, unspecified site: Secondary | ICD-10-CM | POA: Diagnosis not present

## 2020-06-16 DIAGNOSIS — E118 Type 2 diabetes mellitus with unspecified complications: Secondary | ICD-10-CM | POA: Diagnosis not present

## 2020-06-16 DIAGNOSIS — E1165 Type 2 diabetes mellitus with hyperglycemia: Secondary | ICD-10-CM | POA: Diagnosis not present

## 2020-06-16 DIAGNOSIS — E1169 Type 2 diabetes mellitus with other specified complication: Secondary | ICD-10-CM | POA: Diagnosis not present

## 2020-06-16 DIAGNOSIS — I10 Essential (primary) hypertension: Secondary | ICD-10-CM | POA: Diagnosis not present

## 2020-06-16 DIAGNOSIS — E782 Mixed hyperlipidemia: Secondary | ICD-10-CM | POA: Diagnosis not present

## 2020-06-24 DIAGNOSIS — E782 Mixed hyperlipidemia: Secondary | ICD-10-CM | POA: Diagnosis not present

## 2020-06-24 DIAGNOSIS — E118 Type 2 diabetes mellitus with unspecified complications: Secondary | ICD-10-CM | POA: Diagnosis not present

## 2020-06-24 DIAGNOSIS — G720 Drug-induced myopathy: Secondary | ICD-10-CM | POA: Diagnosis not present

## 2020-07-21 DIAGNOSIS — M4310 Spondylolisthesis, site unspecified: Secondary | ICD-10-CM | POA: Diagnosis not present

## 2020-07-21 DIAGNOSIS — M545 Low back pain: Secondary | ICD-10-CM | POA: Diagnosis not present

## 2020-07-21 DIAGNOSIS — M5136 Other intervertebral disc degeneration, lumbar region: Secondary | ICD-10-CM | POA: Diagnosis not present

## 2020-07-21 DIAGNOSIS — G8929 Other chronic pain: Secondary | ICD-10-CM | POA: Diagnosis not present

## 2020-07-22 DIAGNOSIS — E118 Type 2 diabetes mellitus with unspecified complications: Secondary | ICD-10-CM | POA: Diagnosis not present

## 2020-07-22 DIAGNOSIS — E782 Mixed hyperlipidemia: Secondary | ICD-10-CM | POA: Diagnosis not present

## 2020-07-22 DIAGNOSIS — G720 Drug-induced myopathy: Secondary | ICD-10-CM | POA: Diagnosis not present

## 2020-08-04 DIAGNOSIS — M48062 Spinal stenosis, lumbar region with neurogenic claudication: Secondary | ICD-10-CM | POA: Diagnosis not present

## 2020-08-04 DIAGNOSIS — M5136 Other intervertebral disc degeneration, lumbar region: Secondary | ICD-10-CM | POA: Diagnosis not present

## 2020-08-04 DIAGNOSIS — M4310 Spondylolisthesis, site unspecified: Secondary | ICD-10-CM | POA: Diagnosis not present

## 2020-08-04 DIAGNOSIS — G8929 Other chronic pain: Secondary | ICD-10-CM | POA: Diagnosis not present

## 2020-08-04 DIAGNOSIS — M545 Low back pain: Secondary | ICD-10-CM | POA: Diagnosis not present

## 2020-08-11 DIAGNOSIS — I10 Essential (primary) hypertension: Secondary | ICD-10-CM | POA: Diagnosis not present

## 2020-08-11 DIAGNOSIS — M791 Myalgia, unspecified site: Secondary | ICD-10-CM | POA: Diagnosis not present

## 2020-08-11 DIAGNOSIS — Z0001 Encounter for general adult medical examination with abnormal findings: Secondary | ICD-10-CM | POA: Diagnosis not present

## 2020-08-11 DIAGNOSIS — M542 Cervicalgia: Secondary | ICD-10-CM | POA: Diagnosis not present

## 2020-08-11 DIAGNOSIS — E1169 Type 2 diabetes mellitus with other specified complication: Secondary | ICD-10-CM | POA: Diagnosis not present

## 2020-08-11 DIAGNOSIS — E782 Mixed hyperlipidemia: Secondary | ICD-10-CM | POA: Diagnosis not present

## 2020-08-18 DIAGNOSIS — L4059 Other psoriatic arthropathy: Secondary | ICD-10-CM | POA: Diagnosis not present

## 2020-08-18 DIAGNOSIS — M2550 Pain in unspecified joint: Secondary | ICD-10-CM | POA: Diagnosis not present

## 2020-08-18 DIAGNOSIS — M545 Low back pain: Secondary | ICD-10-CM | POA: Diagnosis not present

## 2020-08-25 DIAGNOSIS — M48062 Spinal stenosis, lumbar region with neurogenic claudication: Secondary | ICD-10-CM | POA: Diagnosis not present

## 2020-08-25 DIAGNOSIS — G8929 Other chronic pain: Secondary | ICD-10-CM | POA: Diagnosis not present

## 2020-08-25 DIAGNOSIS — M4310 Spondylolisthesis, site unspecified: Secondary | ICD-10-CM | POA: Diagnosis not present

## 2020-08-25 DIAGNOSIS — M5136 Other intervertebral disc degeneration, lumbar region: Secondary | ICD-10-CM | POA: Diagnosis not present

## 2020-08-25 DIAGNOSIS — M545 Low back pain: Secondary | ICD-10-CM | POA: Diagnosis not present

## 2020-08-26 DIAGNOSIS — Z79899 Other long term (current) drug therapy: Secondary | ICD-10-CM | POA: Diagnosis not present

## 2020-08-26 DIAGNOSIS — M4316 Spondylolisthesis, lumbar region: Secondary | ICD-10-CM | POA: Diagnosis not present

## 2020-08-26 DIAGNOSIS — M1990 Unspecified osteoarthritis, unspecified site: Secondary | ICD-10-CM | POA: Diagnosis not present

## 2020-08-26 DIAGNOSIS — M5116 Intervertebral disc disorders with radiculopathy, lumbar region: Secondary | ICD-10-CM | POA: Diagnosis not present

## 2020-08-26 DIAGNOSIS — I10 Essential (primary) hypertension: Secondary | ICD-10-CM | POA: Diagnosis not present

## 2020-08-26 DIAGNOSIS — Z87891 Personal history of nicotine dependence: Secondary | ICD-10-CM | POA: Diagnosis not present

## 2020-08-26 DIAGNOSIS — M48061 Spinal stenosis, lumbar region without neurogenic claudication: Secondary | ICD-10-CM | POA: Diagnosis not present

## 2020-08-26 DIAGNOSIS — M419 Scoliosis, unspecified: Secondary | ICD-10-CM | POA: Diagnosis not present

## 2020-08-26 DIAGNOSIS — Z7984 Long term (current) use of oral hypoglycemic drugs: Secondary | ICD-10-CM | POA: Diagnosis not present

## 2020-08-26 DIAGNOSIS — M5416 Radiculopathy, lumbar region: Secondary | ICD-10-CM | POA: Diagnosis not present

## 2020-08-26 DIAGNOSIS — M48062 Spinal stenosis, lumbar region with neurogenic claudication: Secondary | ICD-10-CM | POA: Diagnosis not present

## 2020-08-26 DIAGNOSIS — E119 Type 2 diabetes mellitus without complications: Secondary | ICD-10-CM | POA: Diagnosis not present

## 2020-08-26 DIAGNOSIS — L4050 Arthropathic psoriasis, unspecified: Secondary | ICD-10-CM | POA: Diagnosis not present

## 2020-08-26 DIAGNOSIS — G8929 Other chronic pain: Secondary | ICD-10-CM | POA: Diagnosis not present

## 2020-08-29 DIAGNOSIS — Z87891 Personal history of nicotine dependence: Secondary | ICD-10-CM | POA: Diagnosis not present

## 2020-08-29 DIAGNOSIS — K5900 Constipation, unspecified: Secondary | ICD-10-CM | POA: Diagnosis not present

## 2020-08-29 DIAGNOSIS — M48061 Spinal stenosis, lumbar region without neurogenic claudication: Secondary | ICD-10-CM | POA: Diagnosis not present

## 2020-08-29 DIAGNOSIS — M47816 Spondylosis without myelopathy or radiculopathy, lumbar region: Secondary | ICD-10-CM | POA: Diagnosis not present

## 2020-08-29 DIAGNOSIS — M545 Low back pain: Secondary | ICD-10-CM | POA: Diagnosis not present

## 2020-08-29 DIAGNOSIS — G8918 Other acute postprocedural pain: Secondary | ICD-10-CM | POA: Diagnosis not present

## 2020-08-31 DIAGNOSIS — K581 Irritable bowel syndrome with constipation: Secondary | ICD-10-CM | POA: Diagnosis not present

## 2020-08-31 DIAGNOSIS — E782 Mixed hyperlipidemia: Secondary | ICD-10-CM | POA: Diagnosis not present

## 2020-08-31 DIAGNOSIS — I10 Essential (primary) hypertension: Secondary | ICD-10-CM | POA: Diagnosis not present

## 2020-08-31 DIAGNOSIS — E118 Type 2 diabetes mellitus with unspecified complications: Secondary | ICD-10-CM | POA: Diagnosis not present

## 2020-09-17 ENCOUNTER — Encounter (HOSPITAL_COMMUNITY): Payer: Self-pay | Admitting: Physical Therapy

## 2020-09-17 ENCOUNTER — Other Ambulatory Visit: Payer: Self-pay

## 2020-09-17 ENCOUNTER — Ambulatory Visit (HOSPITAL_COMMUNITY): Payer: Medicare Other | Attending: Orthopedic Surgery | Admitting: Physical Therapy

## 2020-09-17 DIAGNOSIS — R29898 Other symptoms and signs involving the musculoskeletal system: Secondary | ICD-10-CM | POA: Diagnosis not present

## 2020-09-17 DIAGNOSIS — R2689 Other abnormalities of gait and mobility: Secondary | ICD-10-CM | POA: Diagnosis not present

## 2020-09-17 DIAGNOSIS — M545 Low back pain, unspecified: Secondary | ICD-10-CM

## 2020-09-17 DIAGNOSIS — R293 Abnormal posture: Secondary | ICD-10-CM | POA: Diagnosis not present

## 2020-09-17 DIAGNOSIS — M6281 Muscle weakness (generalized): Secondary | ICD-10-CM | POA: Diagnosis not present

## 2020-09-17 NOTE — Therapy (Signed)
[place], [place], [address] Phone: [phone]   Fax:  [phone]  Physical Therapy Evaluation  Patient Details  Name: [name] MRN: [id number] Date of Birth: 07-17-1956 Referring Provider (PT): Tomi Likens PA-C   Encounter Date: 09/17/2020   PT End of Session - 09/17/20 1341    Visit Number 1    Number of Visits 12    Date for PT Re-Evaluation 10/29/20    Authorization Type Medicare    Progress Note Due on Visit 10    PT Start Time 1248    PT Stop Time 1338    PT Time Calculation (min) 50 min    Activity Tolerance Patient tolerated treatment well    Behavior During Therapy [facility] for tasks assessed/performed           Past Medical History:  Diagnosis Date  . Arthritis   . Celiac disease   . Diverticulitis   . Fibromyalgia   . Osteoarthritis (arthritis due to wear and tear of joints)   . Renal disorder     Past Surgical History:  Procedure Laterality Date  . APPENDECTOMY    . BACK SURGERY     Patient states that she has had 5 surgeries  . COLONOSCOPY     2011  . NECK SURGERY     Cerivcal Fusion  . NECK SURGERY    . REDUCTION MAMMAPLASTY Bilateral   . TONSILLECTOMY AND ADENOIDECTOMY    . UPPER GASTROINTESTINAL ENDOSCOPY  2011    There were no vitals filed for this visit.    Subjective Assessment - 09/17/20 1257    Subjective Patient is a 64 y.o. female who presents to physical therapy with c/o low back pain. She states she had a foraminotomy with decompression of L1-L2 and L2-L3, laminectomy and micro decompression on 08/26/20. She will be going back to work Monday. She really feels good. She wants to come in a few times for some core strengthening exercises. She has not tried walking far or standing for long periods of time but is able to stand better now. Right after surgery she had a lot of pain in both legs. She has 6 back surgeries and 1 neck surgery. She does not have any  hardware in her back from surgeries. Her main goal is to get exercises to strengthen back so she can lose weight.    Pertinent History 6 back and 1 neck surgery    Limitations Walking;House hold activities    How long can you stand comfortably? 15 minutes    Patient Stated Goals get exercises to strengthen back so she can lose weight    Currently in Pain? No/denies              [facility] PT Assessment - 09/17/20 0001      Assessment   Medical Diagnosis Back pain following micro decompression    Referring Provider (PT) Tomi Likens PA-C    Onset Date/Surgical Date 08/26/20    Next MD Visit Nov 9    Prior Therapy none      Precautions   Precautions Back      Restrictions   Weight Bearing Restrictions No      Balance Screen   Has the patient fallen in the past 6 months No    Has the patient had a decrease in activity level because of a fear of falling?  No    Is the patient reluctant to leave their home  because of a fear of falling?  No      Prior Function   Level of Independence Independent    Vocation Part time employment;Retired    Patent attorney   Overall Cognitive Status Within Functional Limits for tasks assessed      Observation/Other Assessments   Observations Ambulates without AD, trunk flexed, healing surgical incision    Focus on Therapeutic Outcomes (FOTO)  complete next session      Sensation   Light Touch Appears Intact      ROM / Strength   AROM / PROM / Strength AROM;Strength      AROM   Overall AROM Comments apprehenison for AROM    AROM Assessment Site Lumbar    Lumbar Flexion 75% limited    Lumbar Extension 90% limited slight pain    Lumbar - Right Side Bend 75% limited slight pain    Lumbar - Left Side Bend 75% limited slight pain    Lumbar - Right Rotation 50% limited    Lumbar - Left Rotation 50% limited      Strength   Strength Assessment Site Hip;Knee;Ankle    Right/Left Hip Right;Left     Right Hip Flexion 4/5    Left Hip Flexion 4+/5    Left Hip ABduction 3+/5    Right/Left Knee Right;Left    Right Knee Flexion 4+/5    Right Knee Extension 5/5    Left Knee Flexion 5/5    Left Knee Extension 5/5    Right/Left Ankle Right;Left    Right Ankle Dorsiflexion 5/5    Left Ankle Dorsiflexion 5/5      Palpation   Palpation comment Hyperactive lumbar and thoracic paraspinals      Transfers   Comments slow, labored      Ambulation/Gait   Ambulation/Gait Yes    Ambulation/Gait Assistance 7: Independent    Ambulation Distance (Feet) 375 Feet    Assistive device None    Gait Pattern Trunk flexed;Trendelenburg;Lateral trunk lean to right    Ambulation Surface Level;Indoor    Gait velocity decreased    Gait Comments 2MWT, fatigue noted throughout                      Objective measurements completed on examination: See above findings.       [place] Adult PT Treatment/Exercise - 09/17/20 0001      Exercises   Exercises Lumbar      Lumbar Exercises: Supine   Ab Set 10 reps;5 seconds                  PT Education - 09/17/20 1300    Education Details Patient educated on exam findings, POC, scope of PT, initial HEP, recovery process from surgery, getting MD clearance to drive    Person(s) Educated Patient    Methods Explanation;Demonstration;Handout    Comprehension Verbalized understanding;Returned demonstration            PT Short Term Goals - 09/17/20 1352      PT SHORT TERM GOAL #1   Title Patient will be independent with HEP in order to improve functional outcomes.    Time 3    Period Weeks    Status New    Target Date 10/08/20      PT SHORT TERM GOAL #2   Title Patient will report at least 25% improvement in symptoms for improved quality of life.    Time  6    Period Weeks    Status New    Target Date 10/29/20             PT Long Term Goals - 09/17/20 1353      PT LONG TERM GOAL #1   Title Patient will report at least  75% improvement in symptoms for improved quality of life.    Time 6    Period Weeks    Status New    Target Date 10/29/20      PT LONG TERM GOAL #2   Title Patient will improve FOTO score by at least 5 points in order to indicate improved tolerance to activity.    Time 6    Period Weeks    Status New    Target Date 10/29/20      PT LONG TERM GOAL #3   Title Patient will be able to ambulate at least 450 feet in 2MWT in order to demonstrate improved tolerance to activity.    Time 6    Period Weeks    Status New    Target Date 10/29/20      PT LONG TERM GOAL #4   Title Patient will be able to ambulate for at least 30 minutes with pain no greater than 1/10 in order to demonstrate improved ability to ambulate in the community.    Time 6    Period Weeks    Status New    Target Date 10/29/20                  Plan - 09/17/20 1347    Clinical Impression Statement Patient is a 64 y.o. female who presents to physical therapy with c/o low back pain following micro decompression surgery of lumbar spine on 08/26/20. She presents with pain limited deficits in lumbar and LE strength, ROM, endurance, postural impairments, gait activity tolerance, hyperactive musculature, spinal mobility and functional mobility with ADL. She is having to modify and restrict ADL as indicated by subjective information and objective measures which is affecting overall participation. Patient will benefit from skilled physical therapy in order to improve function and reduce impairment.    Personal Factors and Comorbidities Fitness;Behavior Pattern;Past/Current Experience;Time since onset of injury/illness/exacerbation;Comorbidity 3+    Comorbidities overweight, 6 lumbar and 1 cervical surgery, chronic pain    Examination-Activity Limitations Bed Mobility;Caring for Others;Bend;Carry;Dressing;Lift;Locomotion Level;Hygiene/Grooming;Squat;Stairs;Sit;Reach Overhead;Transfers    Examination-Participation Restrictions  Cleaning;Yard Work;Volunteer;Shop;Laundry;Community Activity    Stability/Clinical Decision Making Stable/Uncomplicated    Clinical Decision Making Low    Rehab Potential Fair    PT Frequency 2x / week    PT Duration 6 weeks    PT Treatment/Interventions ADLs/Self Care Home Management;Biofeedback;Aquatic Therapy;Cryotherapy;Electrical Stimulation;DME Instruction;Ultrasound;Traction;Moist Heat;Iontophoresis 4mg /ml Dexamethasone;Gait training;Stair training;Functional mobility training;Therapeutic activities;Therapeutic exercise;Balance training;Orthotic Fit/Training;Patient/family education;Neuromuscular re-education;Manual techniques;Passive range of motion;Scar mobilization;Dry needling;Energy conservation;Splinting;Taping;Spinal Manipulations;Joint Manipulations    PT Next Visit Plan complete FOTO, continue core strengtheing, begin ROM and postural strengthening and progress as able, begin hip strengthening    PT Home Exercise Plan 9/30 ab set    Consulted and Agree with Plan of Care Patient           Patient will benefit from skilled therapeutic intervention in order to improve the following deficits and impairments:  Abnormal gait, Decreased range of motion, Difficulty walking, Decreased endurance, Increased muscle spasms, Decreased activity tolerance, Pain, Postural dysfunction, Decreased mobility, Decreased strength, Impaired flexibility, Improper body mechanics  Visit Diagnosis: Low back pain, unspecified back pain laterality, unspecified chronicity, unspecified whether sciatica  present  Muscle weakness (generalized)  Other abnormalities of gait and mobility  Other symptoms and signs involving the musculoskeletal system  Abnormal posture     Problem List Patient Active Problem List   Diagnosis Date Noted  . Celiac disease 09/24/2013  . DIABETES MELLITUS, TYPE II 08/02/2007  . DEPRESSION 08/02/2007  . HYPERTENSION 08/02/2007    1:56 PM, 09/17/20 Mearl Latin PT,  DPT Physical Therapist at [place], [place], [address] Phone: [phone]   Fax:  [phone]  Name: [name] MRN: [id number] Date of Birth: 1956-12-04

## 2020-09-21 NOTE — Addendum Note (Signed)
Addended by: Mearl Latin on: 09/21/2020 09:17 AM   Modules accepted: Orders

## 2020-09-22 ENCOUNTER — Ambulatory Visit (HOSPITAL_COMMUNITY): Payer: Medicare Other | Admitting: Physical Therapy

## 2020-09-22 ENCOUNTER — Telehealth (HOSPITAL_COMMUNITY): Payer: Self-pay | Admitting: Physical Therapy

## 2020-09-22 NOTE — Telephone Encounter (Signed)
pt called to cx her appt for today due to her migraine headache

## 2020-09-25 ENCOUNTER — Ambulatory Visit (HOSPITAL_COMMUNITY): Payer: Medicare Other | Attending: Orthopedic Surgery

## 2020-09-25 ENCOUNTER — Encounter (HOSPITAL_COMMUNITY): Payer: Self-pay

## 2020-09-25 ENCOUNTER — Other Ambulatory Visit: Payer: Self-pay

## 2020-09-25 DIAGNOSIS — R2689 Other abnormalities of gait and mobility: Secondary | ICD-10-CM | POA: Insufficient documentation

## 2020-09-25 DIAGNOSIS — M545 Low back pain, unspecified: Secondary | ICD-10-CM

## 2020-09-25 DIAGNOSIS — R29898 Other symptoms and signs involving the musculoskeletal system: Secondary | ICD-10-CM | POA: Diagnosis not present

## 2020-09-25 DIAGNOSIS — M6281 Muscle weakness (generalized): Secondary | ICD-10-CM | POA: Insufficient documentation

## 2020-09-25 DIAGNOSIS — R293 Abnormal posture: Secondary | ICD-10-CM

## 2020-09-25 NOTE — Therapy (Signed)
[place], [place], [address] Phone: [phone]   Fax:  [phone]  Physical Therapy Treatment  Patient Details  Name: [name] MRN: [id number] Date of Birth: [birth year] Referring Provider (PT): Tomi Likens PA-C   Encounter Date: 09/25/2020   PT End of Session - 09/25/20 1411    Visit Number 2    Number of Visits 12    Date for PT Re-Evaluation 10/29/20    Authorization Type Medicare    Progress Note Due on Visit 10    PT Start Time 3825    PT Stop Time 1445    PT Time Calculation (min) 41 min    Activity Tolerance Patient tolerated treatment well    Behavior During Therapy [facility] for tasks assessed/performed           Past Medical History:  Diagnosis Date  . Arthritis   . Celiac disease   . Diverticulitis   . Fibromyalgia   . Osteoarthritis (arthritis due to wear and tear of joints)   . Renal disorder     Past Surgical History:  Procedure Laterality Date  . APPENDECTOMY    . BACK SURGERY     Patient states that she has had 5 surgeries  . COLONOSCOPY     2011  . NECK SURGERY     Cerivcal Fusion  . NECK SURGERY    . REDUCTION MAMMAPLASTY Bilateral   . TONSILLECTOMY AND ADENOIDECTOMY    . UPPER GASTROINTESTINAL ENDOSCOPY  2011    There were no vitals filed for this visit.   Subjective Assessment - 09/25/20 1408    Subjective Pt reports she has lower back pain since last session.  Reports she received pain following palpation during eval.  Pain scale 7/10 lower back now, required pain medication today.    Pertinent History 6 back and 1 neck surgery    Patient Stated Goals get exercises to strengthen back so she can lose weight    Currently in Pain? Yes    Pain Score 7     Pain Location Back    Pain Orientation Lower    Pain Descriptors / Indicators Dull    Pain Type Chronic pain;Surgical pain    Pain Onset In the past 7 days    Pain Frequency Constant    Aggravating Factors   pain doesn't stop    Pain Relieving Factors meds, gabapentin              [facility] PT Assessment - 09/25/20 0001      Assessment   Medical Diagnosis Back pain following micro decompression    Referring Provider (PT) Tomi Likens PA-C    Onset Date/Surgical Date 08/26/20    Next MD Visit Nov 9    Prior Therapy none      Precautions   Precautions Back    Precaution Comments no BLT      Observation/Other Assessments   Focus on Therapeutic Outcomes (FOTO)  system down, unable to complete today                         [facility] Adult PT Treatment/Exercise - 09/25/20 0001      Bed Mobility   Bed Mobility Sit to Supine;Right Sidelying to Sit    Right Sidelying to Sit Independent   pt able to complete log roll I   Sit to Supine Independent   pt able to complete log roll I  Exercises   Exercises Lumbar      Lumbar Exercises: Supine   Ab Set 10 reps;5 seconds    Bridge 5 reps;3 seconds    Bridge Limitations limited range    Other Supine Lumbar Exercises Decompression 2-5 5" holds 5 reps    Other Supine Lumbar Exercises Decompression with RTB 5reps                   PT Education - 09/25/20 1442    Education Details Reviewed evaluation findings, goals and educated importance of HEP compliance for maximal benefits.  Assured compliance with HEP    Person(s) Educated Patient    Methods Explanation;Demonstration;Handout    Comprehension Verbalized understanding;Returned demonstration            PT Short Term Goals - 09/17/20 1352      PT SHORT TERM GOAL #1   Title Patient will be independent with HEP in order to improve functional outcomes.    Time 3    Period Weeks    Status New    Target Date 10/08/20      PT SHORT TERM GOAL #2   Title Patient will report at least 25% improvement in symptoms for improved quality of life.    Time 6    Period Weeks    Status New    Target Date 10/29/20             PT Long Term Goals - 09/17/20 1353       PT LONG TERM GOAL #1   Title Patient will report at least 75% improvement in symptoms for improved quality of life.    Time 6    Period Weeks    Status New    Target Date 10/29/20      PT LONG TERM GOAL #2   Title Patient will improve FOTO score by at least 5 points in order to indicate improved tolerance to activity.    Time 6    Period Weeks    Status New    Target Date 10/29/20      PT LONG TERM GOAL #3   Title Patient will be able to ambulate at least 450 feet in 2MWT in order to demonstrate improved tolerance to activity.    Time 6    Period Weeks    Status New    Target Date 10/29/20      PT LONG TERM GOAL #4   Title Patient will be able to ambulate for at least 30 minutes with pain no greater than 1/10 in order to demonstrate improved ability to ambulate in the community.    Time 6    Period Weeks    Status New    Target Date 10/29/20                 Plan - 09/25/20 1434    Clinical Impression Statement Pt limited by pain and ambulated with decreased stance Rt LE and flexed at trunk.  Reviewed evaluation findings and goals, educated on importance of HEP compliance and assured correct form and mechanics with current exercise program.  Unable to complete FOTO as system down, plan to complete next sessoin.  Pt educated on importance of posture for spinal alingment and pain control.  Session focus on pain control and core/proximal strengthening with decompression exercises.  Pt able to complete with proper form and given additional RTB and printout to add to HEP.  Reports pain reduced at EOS.    Personal Factors  and Comorbidities Fitness;Behavior Pattern;Past/Current Experience;Time since onset of injury/illness/exacerbation;Comorbidity 3+    Comorbidities overweight, 6 lumbar and 1 cervical surgery, chronic pain    Examination-Activity Limitations Bed Mobility;Caring for Others;Bend;Carry;Dressing;Lift;Locomotion Level;Hygiene/Grooming;Squat;Stairs;Sit;Reach  Overhead;Transfers    Examination-Participation Restrictions Cleaning;Yard Work;Volunteer;Shop;Laundry;Community Activity    Stability/Clinical Decision Making Stable/Uncomplicated    Clinical Decision Making Low    Rehab Potential Fair    PT Frequency 2x / week    PT Duration 6 weeks    PT Treatment/Interventions ADLs/Self Care Home Management;Biofeedback;Aquatic Therapy;Cryotherapy;Electrical Stimulation;DME Instruction;Ultrasound;Traction;Moist Heat;Iontophoresis 4mg /ml Dexamethasone;Gait training;Stair training;Functional mobility training;Therapeutic activities;Therapeutic exercise;Balance training;Orthotic Fit/Training;Patient/family education;Neuromuscular re-education;Manual techniques;Passive range of motion;Scar mobilization;Dry needling;Energy conservation;Splinting;Taping;Spinal Manipulations;Joint Manipulations    PT Next Visit Plan Next session complete FOTO, standing posture awareness and continue core strengtheing, begin ROM and postural strengthening and progress as able, begin hip strengthening    PT Home Exercise Plan 9/30 ab set; 09/25/20: decompression 1-5 as well as RTB           Patient will benefit from skilled therapeutic intervention in order to improve the following deficits and impairments:  Abnormal gait, Decreased range of motion, Difficulty walking, Decreased endurance, Increased muscle spasms, Decreased activity tolerance, Pain, Postural dysfunction, Decreased mobility, Decreased strength, Impaired flexibility, Improper body mechanics  Visit Diagnosis: Low back pain, unspecified back pain laterality, unspecified chronicity, unspecified whether sciatica present  Muscle weakness (generalized)  Other abnormalities of gait and mobility  Other symptoms and signs involving the musculoskeletal system  Abnormal posture     Problem List Patient Active Problem List   Diagnosis Date Noted  . Celiac disease 09/24/2013  . DIABETES MELLITUS, TYPE II 08/02/2007  .  DEPRESSION 08/02/2007  . HYPERTENSION 08/02/2007   Ihor Austin, LPTA/CLT; CBIS [phone]  Aldona Lento 09/25/2020, 4:18 PM  [place] [address] [place], [place], [address] Phone: [phone]   Fax:  [phone]  Name: [name] MRN: [id number] Date of Birth: [birth year]

## 2020-09-28 ENCOUNTER — Other Ambulatory Visit: Payer: Medicare Other

## 2020-10-01 ENCOUNTER — Other Ambulatory Visit: Payer: Self-pay

## 2020-10-01 ENCOUNTER — Ambulatory Visit (HOSPITAL_COMMUNITY): Payer: Medicare Other | Admitting: Physical Therapy

## 2020-10-01 DIAGNOSIS — M545 Low back pain, unspecified: Secondary | ICD-10-CM | POA: Diagnosis not present

## 2020-10-01 DIAGNOSIS — M6281 Muscle weakness (generalized): Secondary | ICD-10-CM

## 2020-10-01 DIAGNOSIS — R293 Abnormal posture: Secondary | ICD-10-CM | POA: Diagnosis not present

## 2020-10-01 DIAGNOSIS — R29898 Other symptoms and signs involving the musculoskeletal system: Secondary | ICD-10-CM | POA: Diagnosis not present

## 2020-10-01 DIAGNOSIS — R2689 Other abnormalities of gait and mobility: Secondary | ICD-10-CM

## 2020-10-01 NOTE — Therapy (Signed)
[place] [address] [place], [place], [address] Phone: [phone]   Fax:  [phone]  Physical Therapy Treatment  Patient Details  Name: [name] MRN: [id number] Date of Birth: [birth year] Referring Provider (PT): Tomi Likens PA-C   Encounter Date: 10/01/2020   PT End of Session - 10/01/20 1642    Visit Number 3    Number of Visits 12    Date for PT Re-Evaluation 10/29/20    Authorization Type Medicare    Progress Note Due on Visit 10    PT Start Time 1450    PT Stop Time 1545    PT Time Calculation (min) 55 min    Activity Tolerance Patient tolerated treatment well    Behavior During Therapy [facility] for tasks assessed/performed           Past Medical History:  Diagnosis Date  . Arthritis   . Celiac disease   . Diverticulitis   . Fibromyalgia   . Osteoarthritis (arthritis due to wear and tear of joints)   . Renal disorder     Past Surgical History:  Procedure Laterality Date  . APPENDECTOMY    . BACK SURGERY     Patient states that she has had 5 surgeries  . COLONOSCOPY     2011  . NECK SURGERY     Cerivcal Fusion  . NECK SURGERY    . REDUCTION MAMMAPLASTY Bilateral   . TONSILLECTOMY AND ADENOIDECTOMY    . UPPER GASTROINTESTINAL ENDOSCOPY  2011    There were no vitals filed for this visit.   Subjective Assessment - 10/01/20 1455    Subjective Pt reports overall improvement and states her pain was all the way across but now it is situated in one area on the right side of her scar.   currently wtihout pain but varried today 0-1/10    Currently in Pain? No/denies                             [facility] Adult PT Treatment/Exercise - 10/01/20 0001      Lumbar Exercises: Supine   Ab Set 10 reps;5 seconds    Other Supine Lumbar Exercises Decompression 2-5 5" holds 5 reps    Other Supine Lumbar Exercises Decompression with RTB 5reps                     PT Short Term Goals -  09/17/20 1352      PT SHORT TERM GOAL #1   Title Patient will be independent with HEP in order to improve functional outcomes.    Time 3    Period Weeks    Status New    Target Date 10/08/20      PT SHORT TERM GOAL #2   Title Patient will report at least 25% improvement in symptoms for improved quality of life.    Time 6    Period Weeks    Status New    Target Date 10/29/20             PT Long Term Goals - 09/17/20 1353      PT LONG TERM GOAL #1   Title Patient will report at least 75% improvement in symptoms for improved quality of life.    Time 6    Period Weeks    Status New    Target Date 10/29/20      PT LONG TERM GOAL #  2   Title Patient will improve FOTO score by at least 5 points in order to indicate improved tolerance to activity.    Time 6    Period Weeks    Status New    Target Date 10/29/20      PT LONG TERM GOAL #3   Title Patient will be able to ambulate at least 450 feet in 2MWT in order to demonstrate improved tolerance to activity.    Time 6    Period Weeks    Status New    Target Date 10/29/20      PT LONG TERM GOAL #4   Title Patient will be able to ambulate for at least 30 minutes with pain no greater than 1/10 in order to demonstrate improved ability to ambulate in the community.    Time 6    Period Weeks    Status New    Target Date 10/29/20                 Plan - 10/01/20 1649    Clinical Impression Statement FOTO completed this session with overall functional score of 43%.  Continued with Decompression exercises with ability to recall and complete all in correct form this session.   Pt reporting overall improvement since beginning therapy   FOTO completed this session with overall functional score of 43%.  Continued with Decompression exercises with ability to recall and complete all in correct form this session.   Pt reporting overall improvement since beginning therapy   Personal Factors and Comorbidities Fitness;Behavior  Pattern;Past/Current Experience;Time since onset of injury/illness/exacerbation;Comorbidity 3+    Comorbidities overweight, 6 lumbar and 1 cervical surgery, chronic pain    Examination-Activity Limitations Bed Mobility;Caring for Others;Bend;Carry;Dressing;Lift;Locomotion Level;Hygiene/Grooming;Squat;Stairs;Sit;Reach Overhead;Transfers    Examination-Participation Restrictions Cleaning;Yard Work;Volunteer;Shop;Laundry;Community Activity    Stability/Clinical Decision Making Stable/Uncomplicated    Rehab Potential Fair    PT Frequency 2x / week    PT Duration 6 weeks    PT Treatment/Interventions ADLs/Self Care Home Management;Biofeedback;Aquatic Therapy;Cryotherapy;Electrical Stimulation;DME Instruction;Ultrasound;Traction;Moist Heat;Iontophoresis 4mg /ml Dexamethasone;Gait training;Stair training;Functional mobility training;Therapeutic activities;Therapeutic exercise;Balance training;Orthotic Fit/Training;Patient/family education;Neuromuscular re-education;Manual techniques;Passive range of motion;Scar mobilization;Dry needling;Energy conservation;Splinting;Taping;Spinal Manipulations;Joint Manipulations    PT Next Visit Plan Begin standing posture awareness and continue core strengtheing, begin ROM and postural strengthening and progress as able, begin hip strengthening    PT Home Exercise Plan 9/30 ab set; 09/25/20: decompression 1-5 as well as RTB           Patient will benefit from skilled therapeutic intervention in order to improve the following deficits and impairments:  Abnormal gait, Decreased range of motion, Difficulty walking, Decreased endurance, Increased muscle spasms, Decreased activity tolerance, Pain, Postural dysfunction, Decreased mobility, Decreased strength, Impaired flexibility, Improper body mechanics  Visit Diagnosis: Low back pain, unspecified back pain laterality, unspecified chronicity, unspecified whether sciatica present  Muscle weakness (generalized)  Other  abnormalities of gait and mobility  Other symptoms and signs involving the musculoskeletal system  Abnormal posture     Problem List Patient Active Problem List   Diagnosis Date Noted  . Celiac disease 09/24/2013  . DIABETES MELLITUS, TYPE II 08/02/2007  . DEPRESSION 08/02/2007  . HYPERTENSION 08/02/2007   Teena Irani, PTA/CLT [phone]  Teena Irani 10/01/2020, 4:51 PM  [place] [address] [place], [place], [address] Phone: [phone]   Fax:  [phone]  Name: [name] MRN: [id number] Date of Birth: [birth year]

## 2020-10-02 ENCOUNTER — Ambulatory Visit (HOSPITAL_COMMUNITY): Payer: Medicare Other

## 2020-10-02 ENCOUNTER — Telehealth (HOSPITAL_COMMUNITY): Payer: Self-pay

## 2020-10-02 NOTE — Telephone Encounter (Signed)
pt called to cx this appt due to she has joint pain and do not want a flair up

## 2020-10-06 ENCOUNTER — Encounter (HOSPITAL_COMMUNITY): Payer: Medicare Other | Admitting: Physical Therapy

## 2020-10-08 ENCOUNTER — Encounter (HOSPITAL_COMMUNITY): Payer: Medicare Other | Admitting: Physical Therapy

## 2020-10-09 ENCOUNTER — Ambulatory Visit (HOSPITAL_COMMUNITY): Payer: Medicare Other | Admitting: Physical Therapy

## 2020-10-09 ENCOUNTER — Telehealth (HOSPITAL_COMMUNITY): Payer: Self-pay | Admitting: Physical Therapy

## 2020-10-09 NOTE — Telephone Encounter (Signed)
S/w patient she was crying and stated her dear friend just passed away and she is in no shape to come to PT today. Sorry for the same day cx'lation.

## 2020-10-13 ENCOUNTER — Telehealth (HOSPITAL_COMMUNITY): Payer: Self-pay | Admitting: Physical Therapy

## 2020-10-13 ENCOUNTER — Ambulatory Visit (HOSPITAL_COMMUNITY): Payer: Medicare Other | Admitting: Physical Therapy

## 2020-10-13 DIAGNOSIS — N390 Urinary tract infection, site not specified: Secondary | ICD-10-CM | POA: Diagnosis not present

## 2020-10-13 DIAGNOSIS — M797 Fibromyalgia: Secondary | ICD-10-CM | POA: Diagnosis not present

## 2020-10-13 DIAGNOSIS — K900 Celiac disease: Secondary | ICD-10-CM | POA: Diagnosis not present

## 2020-10-13 DIAGNOSIS — E1169 Type 2 diabetes mellitus with other specified complication: Secondary | ICD-10-CM | POA: Diagnosis not present

## 2020-10-13 DIAGNOSIS — M5430 Sciatica, unspecified side: Secondary | ICD-10-CM | POA: Diagnosis not present

## 2020-10-13 DIAGNOSIS — I10 Essential (primary) hypertension: Secondary | ICD-10-CM | POA: Diagnosis not present

## 2020-10-13 DIAGNOSIS — E1165 Type 2 diabetes mellitus with hyperglycemia: Secondary | ICD-10-CM | POA: Diagnosis not present

## 2020-10-13 NOTE — Telephone Encounter (Signed)
Pt did not show for appointment.  Called and spoke to patient who states she was having issues with her phone and new bank app and had to get that straight.  States her phone locked up and and that is why she could not contact our office regarding her appointment.   Pt reminded of upcoming appointment this Thursday at 11:30.  Teena Irani, PTA/CLT [phone]

## 2020-10-15 ENCOUNTER — Ambulatory Visit (HOSPITAL_COMMUNITY): Payer: Medicare Other | Admitting: Physical Therapy

## 2020-10-15 ENCOUNTER — Other Ambulatory Visit: Payer: Self-pay

## 2020-10-15 DIAGNOSIS — M545 Low back pain, unspecified: Secondary | ICD-10-CM | POA: Diagnosis not present

## 2020-10-15 DIAGNOSIS — M6281 Muscle weakness (generalized): Secondary | ICD-10-CM

## 2020-10-15 DIAGNOSIS — R2689 Other abnormalities of gait and mobility: Secondary | ICD-10-CM

## 2020-10-15 DIAGNOSIS — R29898 Other symptoms and signs involving the musculoskeletal system: Secondary | ICD-10-CM | POA: Diagnosis not present

## 2020-10-15 DIAGNOSIS — E782 Mixed hyperlipidemia: Secondary | ICD-10-CM | POA: Diagnosis not present

## 2020-10-15 DIAGNOSIS — E1169 Type 2 diabetes mellitus with other specified complication: Secondary | ICD-10-CM | POA: Diagnosis not present

## 2020-10-15 DIAGNOSIS — R293 Abnormal posture: Secondary | ICD-10-CM | POA: Diagnosis not present

## 2020-10-15 DIAGNOSIS — M791 Myalgia, unspecified site: Secondary | ICD-10-CM | POA: Diagnosis not present

## 2020-10-15 DIAGNOSIS — I10 Essential (primary) hypertension: Secondary | ICD-10-CM | POA: Diagnosis not present

## 2020-10-15 DIAGNOSIS — Z23 Encounter for immunization: Secondary | ICD-10-CM | POA: Diagnosis not present

## 2020-10-15 NOTE — Therapy (Signed)
[place], [place], [address] Phone: [phone]   Fax:  [phone]  Physical Therapy Treatment  Patient Details  Name: [name] MRN: [id number] Date of Birth: [birth year] Referring Provider (PT): Tomi Likens PA-C   Encounter Date: 10/15/2020   PT End of Session - 10/15/20 1237    Visit Number 4    Number of Visits 12    Date for PT Re-Evaluation 10/29/20    Authorization Type Medicare    Progress Note Due on Visit 10    PT Start Time 1137    PT Stop Time 1223    PT Time Calculation (min) 46 min    Activity Tolerance Patient tolerated treatment well    Behavior During Therapy [facility] for tasks assessed/performed           Past Medical History:  Diagnosis Date  . Arthritis   . Celiac disease   . Diverticulitis   . Fibromyalgia   . Osteoarthritis (arthritis due to wear and tear of joints)   . Renal disorder     Past Surgical History:  Procedure Laterality Date  . APPENDECTOMY    . BACK SURGERY     Patient states that she has had 5 surgeries  . COLONOSCOPY     2011  . NECK SURGERY     Cerivcal Fusion  . NECK SURGERY    . REDUCTION MAMMAPLASTY Bilateral   . TONSILLECTOMY AND ADENOIDECTOMY    . UPPER GASTROINTESTINAL ENDOSCOPY  2011    There were no vitals filed for this visit.   Subjective Assessment - 10/15/20 1149    Subjective Pt feels like she has regressed instead of progressed.                             [place] Adult PT Treatment/Exercise - 10/15/20 0001      Lumbar Exercises: Stretches   Active Hamstring Stretch Right;Left;2 reps;20 seconds    Active Hamstring Stretch Limitations loing sitting      Lumbar Exercises: Supine   Other Supine Lumbar Exercises Decompression 2-5 5" holds 5 reps    Other Supine Lumbar Exercises Decompression with RTB 5reps       Manual Therapy   Manual Therapy Soft tissue mobilization    Manual therapy comments completed  seperately from all other skilled interventions    Soft tissue mobilization Rt sidelying to bilateral lumbar mm                    PT Short Term Goals - 09/17/20 1352      PT SHORT TERM GOAL #1   Title Patient will be independent with HEP in order to improve functional outcomes.    Time 3    Period Weeks    Status New    Target Date 10/08/20      PT SHORT TERM GOAL #2   Title Patient will report at least 25% improvement in symptoms for improved quality of life.    Time 6    Period Weeks    Status New    Target Date 10/29/20             PT Long Term Goals - 09/17/20 1353      PT LONG TERM GOAL #1   Title Patient will report at least 75% improvement in symptoms for improved quality of life.    Time 6    Period  Weeks    Status New    Target Date 10/29/20      PT LONG TERM GOAL #2   Title Patient will improve FOTO score by at least 5 points in order to indicate improved tolerance to activity.    Time 6    Period Weeks    Status New    Target Date 10/29/20      PT LONG TERM GOAL #3   Title Patient will be able to ambulate at least 450 feet in 2MWT in order to demonstrate improved tolerance to activity.    Time 6    Period Weeks    Status New    Target Date 10/29/20      PT LONG TERM GOAL #4   Title Patient will be able to ambulate for at least 30 minutes with pain no greater than 1/10 in order to demonstrate improved ability to ambulate in the community.    Time 6    Period Weeks    Status New    Target Date 10/29/20                 Plan - 10/15/20 1236    Clinical Impression Statement Pt with noted frustrations from slow improvements.  Reports compliance with HEP but remains stiff and sore and unable to ambulate any distance before her back starts hurting worse. Continued with decompression exercise with cues needed for general form and stability.  Instructed with long sitting hamstring stretch with noted tightness bilaterally.  Soft tissue  completed to bil lumbar region in sidelying at EOS to help decrease tightness and reduce pain. General tightness palpated but no spasms or trigger points.  Pt reported slight improvement at EOS.    Personal Factors and Comorbidities Fitness;Behavior Pattern;Past/Current Experience;Time since onset of injury/illness/exacerbation;Comorbidity 3+    Comorbidities overweight, 6 lumbar and 1 cervical surgery, chronic pain    Examination-Activity Limitations Bed Mobility;Caring for Others;Bend;Carry;Dressing;Lift;Locomotion Level;Hygiene/Grooming;Squat;Stairs;Sit;Reach Overhead;Transfers    Examination-Participation Restrictions Cleaning;Yard Work;Volunteer;Shop;Laundry;Community Activity    Stability/Clinical Decision Making Stable/Uncomplicated    Rehab Potential Fair    PT Frequency 2x / week    PT Duration 6 weeks    PT Treatment/Interventions ADLs/Self Care Home Management;Biofeedback;Aquatic Therapy;Cryotherapy;Electrical Stimulation;DME Instruction;Ultrasound;Traction;Moist Heat;Iontophoresis 4mg /ml Dexamethasone;Gait training;Stair training;Functional mobility training;Therapeutic activities;Therapeutic exercise;Balance training;Orthotic Fit/Training;Patient/family education;Neuromuscular re-education;Manual techniques;Passive range of motion;Scar mobilization;Dry needling;Energy conservation;Splinting;Taping;Spinal Manipulations;Joint Manipulations    PT Next Visit Plan Begin standing posture awareness (UE flexion against wall, seated retraction) and continue core strengtheing, begin ROM and postural strengthening and progress as able, begin hip strengthening    PT Home Exercise Plan 9/30 ab set; 09/25/20: decompression 1-5 as well as RTB           Patient will benefit from skilled therapeutic intervention in order to improve the following deficits and impairments:  Abnormal gait, Decreased range of motion, Difficulty walking, Decreased endurance, Increased muscle spasms, Decreased activity  tolerance, Pain, Postural dysfunction, Decreased mobility, Decreased strength, Impaired flexibility, Improper body mechanics  Visit Diagnosis: Muscle weakness (generalized)  Other abnormalities of gait and mobility  Low back pain, unspecified back pain laterality, unspecified chronicity, unspecified whether sciatica present  Other symptoms and signs involving the musculoskeletal system     Problem List Patient Active Problem List   Diagnosis Date Noted  . Celiac disease 09/24/2013  . DIABETES MELLITUS, TYPE II 08/02/2007  . DEPRESSION 08/02/2007  . HYPERTENSION 08/02/2007   Teena Irani, PTA/CLT [phone]  Roseanne Reno B 10/15/2020, 12:39 PM  [facility]  [place], [place], [address] Phone: [phone]   Fax:  [phone]  Name: [name] MRN: [id number] Date of Birth: Aug 22, 1956

## 2020-10-20 ENCOUNTER — Encounter (HOSPITAL_COMMUNITY): Payer: Self-pay | Admitting: Physical Therapy

## 2020-10-20 ENCOUNTER — Ambulatory Visit (HOSPITAL_COMMUNITY): Payer: Medicare Other | Attending: Orthopedic Surgery | Admitting: Physical Therapy

## 2020-10-20 ENCOUNTER — Other Ambulatory Visit: Payer: Self-pay

## 2020-10-20 DIAGNOSIS — M545 Low back pain, unspecified: Secondary | ICD-10-CM

## 2020-10-20 DIAGNOSIS — R2689 Other abnormalities of gait and mobility: Secondary | ICD-10-CM

## 2020-10-20 DIAGNOSIS — M6281 Muscle weakness (generalized): Secondary | ICD-10-CM

## 2020-10-20 DIAGNOSIS — R293 Abnormal posture: Secondary | ICD-10-CM | POA: Diagnosis not present

## 2020-10-20 DIAGNOSIS — R29898 Other symptoms and signs involving the musculoskeletal system: Secondary | ICD-10-CM | POA: Diagnosis not present

## 2020-10-20 NOTE — Patient Instructions (Signed)
Standing with right side  up against the wall and feet flat on the floor - try to bring right hip towards the wall to stand in tall upright posture - hold for 5 seconds and perform 5 times - repeat as many times as you feel appropriate. - perform multiple time throughout the day.

## 2020-10-20 NOTE — Therapy (Addendum)
[name] [address] [place], [place], [address] Phone: [phone]   Fax:  [phone]  Physical Therapy Treatment and Discharge Note  Patient Details  Name: [name] MRN: [id number] Date of Birth: 1956/11/18 Referring Provider (PT): Tomi Likens PA-C   PHYSICAL THERAPY DISCHARGE SUMMARY  Visits from Start of Care: 5  Current functional level related to goals / functional outcomes: Unable to assess due to unplanned discharge   Remaining deficits: Unable to assess due to unplanned discharge   Education / Equipment: Unable to assess due to unplanned discharge Plan: Patient agrees to discharge.  Patient goals were not met. Patient is being discharged due to not returning since the last visit.  ?????         10:32 AM, 01/19/21 [name], DPT Physical Therapy with [facility]  [phone] office    Encounter Date: 10/20/2020   PT End of Session - 10/20/20 0750    Visit Number 5    Number of Visits 12    Date for PT Re-Evaluation 10/29/20    Authorization Type Medicare    Progress Note Due on Visit 10    PT Start Time 0749    PT Stop Time 0827    PT Time Calculation (min) 38 min    Activity Tolerance Patient tolerated treatment well    Behavior During Therapy [facility] for tasks assessed/performed           Past Medical History:  Diagnosis Date  . Arthritis   . Celiac disease   . Diverticulitis   . Fibromyalgia   . Osteoarthritis (arthritis due to wear and tear of joints)   . Renal disorder     Past Surgical History:  Procedure Laterality Date  . APPENDECTOMY    . BACK SURGERY     Patient states that she has had 5 surgeries  . COLONOSCOPY     2011  . NECK SURGERY     Cerivcal Fusion  . NECK SURGERY    . REDUCTION MAMMAPLASTY Bilateral   . TONSILLECTOMY AND ADENOIDECTOMY    . UPPER GASTROINTESTINAL ENDOSCOPY  2011    There were no vitals filed for this visit.    Subjective Assessment - 10/20/20 0804    Subjective States her pain is pretty bad this morning reporting 4/10 pain described as soreness. States she is doing her exercises and has been trying to walk. States she sees the MD on the 9th. States she doesn't know if she should push through the pain when she walks and she will ask him then.    Pertinent History 6 back and 1 neck surgery    Limitations Walking;House hold activities              [facility] PT Assessment - 10/20/20 0001      Assessment   Medical Diagnosis Back pain following micro decompression    Referring Provider (PT) Tomi Likens PA-C    Onset Date/Surgical Date 08/26/20    Next MD Visit Nov 9    Prior Therapy none                         [facility] Adult PT Treatment/Exercise - 10/20/20 0001      Lumbar Exercises: Stretches   Double Knee to Chest Stretch --   4 minutes on exercise ball    Lower Trunk Rotation --   4 minutes to tolerance with green ball  Lumbar Exercises: Standing   Other Standing Lumbar Exercises lateral hip excursions - too difficult, standing tall at wall - bringing right hip towards wall - 4x5 5" holds                  PT Education - 10/20/20 0833    Education Details Patient educated in typical therapy evaluation and focus on palpation, ROM. Educated patient on gradual motion and reason for BLT's and how to refrain from repeated lumbar ROM but that ability to move into motions within painfree range. educated patient in current standing posture and focus on that. educated patient in rationale for decompression exercises, reassured patient.    Person(s) Educated Patient    Methods Explanation    Comprehension Verbalized understanding            PT Short Term Goals - 09/17/20 1352      PT SHORT TERM GOAL #1   Title Patient will be independent with HEP in order to improve functional outcomes.    Time 3    Period Weeks    Status New    Target Date 10/08/20      PT  SHORT TERM GOAL #2   Title Patient will report at least 25% improvement in symptoms for improved quality of life.    Time 6    Period Weeks    Status New    Target Date 10/29/20             PT Long Term Goals - 09/17/20 1353      PT LONG TERM GOAL #1   Title Patient will report at least 75% improvement in symptoms for improved quality of life.    Time 6    Period Weeks    Status New    Target Date 10/29/20      PT LONG TERM GOAL #2   Title Patient will improve FOTO score by at least 5 points in order to indicate improved tolerance to activity.    Time 6    Period Weeks    Status New    Target Date 10/29/20      PT LONG TERM GOAL #3   Title Patient will be able to ambulate at least 450 feet in 2MWT in order to demonstrate improved tolerance to activity.    Time 6    Period Weeks    Status New    Target Date 10/29/20      PT LONG TERM GOAL #4   Title Patient will be able to ambulate for at least 30 minutes with pain no greater than 1/10 in order to demonstrate improved ability to ambulate in the community.    Time 6    Period Weeks    Status New    Target Date 10/29/20                 Plan - 10/20/20 0805    Clinical Impression Statement Session focused on education as patient was concerned about performing ROM exercises in session, unsure of initial evaluation palpation rationale, and current exercise rationale. Discussed and answered all questions during session. Transitioned to more lower body exercises and patient reported desire to continue this instead of upper body exercises. Added posture exercise to HEP and patient reported she felt weak after exercise. Explained muscles working within a new length and it is normal for them to feel weak in a new position.    Personal Factors and Comorbidities Fitness;Behavior Pattern;Past/Current Experience;Time since onset of injury/illness/exacerbation;Comorbidity  3+    Comorbidities overweight, 6 lumbar and 1 cervical  surgery, chronic pain    Examination-Activity Limitations Bed Mobility;Caring for Others;Bend;Carry;Dressing;Lift;Locomotion Level;Hygiene/Grooming;Squat;Stairs;Sit;Reach Overhead;Transfers    Examination-Participation Restrictions Cleaning;Yard Work;Volunteer;Shop;Laundry;Community Activity    Stability/Clinical Decision Making Stable/Uncomplicated    Rehab Potential Fair    PT Frequency 2x / week    PT Duration 6 weeks    PT Treatment/Interventions ADLs/Self Care Home Management;Biofeedback;Aquatic Therapy;Cryotherapy;Electrical Stimulation;DME Instruction;Ultrasound;Traction;Moist Heat;Iontophoresis 33m/ml Dexamethasone;Gait training;Stair training;Functional mobility training;Therapeutic activities;Therapeutic exercise;Balance training;Orthotic Fit/Training;Patient/family education;Neuromuscular re-education;Manual techniques;Passive range of motion;Scar mobilization;Dry needling;Energy conservation;Splinting;Taping;Spinal Manipulations;Joint Manipulations    PT Next Visit Plan transition to lower body and lumbar ROM/strengthening exercises, stop decompression exercises per patient request, assess posture at start of session and work on correcting posture (ex today patient stuck in right SB, ROT and flexion)    PT Home Exercise Plan 9/30 ab set; 09/25/20: decompression 1-5 as well as RTB; 11/2 standing posture exercise right hip up against wall           Patient will benefit from skilled therapeutic intervention in order to improve the following deficits and impairments:  Abnormal gait, Decreased range of motion, Difficulty walking, Decreased endurance, Increased muscle spasms, Decreased activity tolerance, Pain, Postural dysfunction, Decreased mobility, Decreased strength, Impaired flexibility, Improper body mechanics  Visit Diagnosis: Muscle weakness (generalized)  Other abnormalities of gait and mobility  Low back pain, unspecified back pain laterality, unspecified chronicity,  unspecified whether sciatica present  Other symptoms and signs involving the musculoskeletal system  Abnormal posture     Problem List Patient Active Problem List   Diagnosis Date Noted  . Celiac disease 09/24/2013  . DIABETES MELLITUS, TYPE II 08/02/2007  . DEPRESSION 08/02/2007  . HYPERTENSION 08/02/2007    8:38 AM, 10/20/20 MJerene Pitch DPT [facility] [phone]   [address] [address]Phone: [phone]  Fax:  [phone] Name: JVANETTA RULEMRN: [id number] of Birth: [birth year]

## 2020-10-22 ENCOUNTER — Telehealth (HOSPITAL_COMMUNITY): Payer: Self-pay | Admitting: Physical Therapy

## 2020-10-22 ENCOUNTER — Ambulatory Visit (HOSPITAL_COMMUNITY): Payer: Medicare Other | Admitting: Physical Therapy

## 2020-10-22 NOTE — Telephone Encounter (Signed)
pt called to cx this appt lmonvm

## 2020-10-27 ENCOUNTER — Encounter (HOSPITAL_COMMUNITY): Payer: Medicare Other | Admitting: Physical Therapy

## 2020-10-28 ENCOUNTER — Telehealth (HOSPITAL_COMMUNITY): Payer: Self-pay

## 2020-10-28 NOTE — Telephone Encounter (Signed)
pt cancelled the remainder of her appts because her back is worse

## 2020-10-29 ENCOUNTER — Ambulatory Visit (HOSPITAL_COMMUNITY): Payer: Medicare Other

## 2020-11-02 ENCOUNTER — Encounter (HOSPITAL_COMMUNITY): Payer: Medicare Other | Admitting: Physical Therapy

## 2020-11-03 ENCOUNTER — Ambulatory Visit (HOSPITAL_COMMUNITY): Payer: Medicare Other | Admitting: Physical Therapy

## 2020-11-03 DIAGNOSIS — H35371 Puckering of macula, right eye: Secondary | ICD-10-CM | POA: Diagnosis not present

## 2020-11-03 DIAGNOSIS — H35363 Drusen (degenerative) of macula, bilateral: Secondary | ICD-10-CM | POA: Diagnosis not present

## 2020-11-03 DIAGNOSIS — E119 Type 2 diabetes mellitus without complications: Secondary | ICD-10-CM | POA: Diagnosis not present

## 2020-11-03 DIAGNOSIS — H43813 Vitreous degeneration, bilateral: Secondary | ICD-10-CM | POA: Diagnosis not present

## 2020-11-19 DIAGNOSIS — J302 Other seasonal allergic rhinitis: Secondary | ICD-10-CM | POA: Diagnosis not present

## 2020-11-20 DIAGNOSIS — R252 Cramp and spasm: Secondary | ICD-10-CM | POA: Diagnosis not present

## 2020-11-20 DIAGNOSIS — M2550 Pain in unspecified joint: Secondary | ICD-10-CM | POA: Diagnosis not present

## 2020-11-20 DIAGNOSIS — M5136 Other intervertebral disc degeneration, lumbar region: Secondary | ICD-10-CM | POA: Diagnosis not present

## 2020-11-20 DIAGNOSIS — L4059 Other psoriatic arthropathy: Secondary | ICD-10-CM | POA: Diagnosis not present

## 2020-12-15 DIAGNOSIS — M4310 Spondylolisthesis, site unspecified: Secondary | ICD-10-CM | POA: Diagnosis not present

## 2020-12-15 DIAGNOSIS — M545 Low back pain, unspecified: Secondary | ICD-10-CM | POA: Diagnosis not present

## 2020-12-15 DIAGNOSIS — Z9889 Other specified postprocedural states: Secondary | ICD-10-CM | POA: Diagnosis not present

## 2020-12-15 DIAGNOSIS — M5136 Other intervertebral disc degeneration, lumbar region: Secondary | ICD-10-CM | POA: Diagnosis not present

## 2020-12-15 DIAGNOSIS — M48062 Spinal stenosis, lumbar region with neurogenic claudication: Secondary | ICD-10-CM | POA: Diagnosis not present

## 2020-12-18 DIAGNOSIS — E782 Mixed hyperlipidemia: Secondary | ICD-10-CM | POA: Diagnosis not present

## 2020-12-18 DIAGNOSIS — G720 Drug-induced myopathy: Secondary | ICD-10-CM | POA: Diagnosis not present

## 2020-12-18 DIAGNOSIS — E118 Type 2 diabetes mellitus with unspecified complications: Secondary | ICD-10-CM | POA: Diagnosis not present

## 2021-01-21 DIAGNOSIS — E559 Vitamin D deficiency, unspecified: Secondary | ICD-10-CM | POA: Diagnosis not present

## 2021-01-21 DIAGNOSIS — M797 Fibromyalgia: Secondary | ICD-10-CM | POA: Diagnosis not present

## 2021-01-21 DIAGNOSIS — E1165 Type 2 diabetes mellitus with hyperglycemia: Secondary | ICD-10-CM | POA: Diagnosis not present

## 2021-01-21 DIAGNOSIS — I10 Essential (primary) hypertension: Secondary | ICD-10-CM | POA: Diagnosis not present

## 2021-01-21 DIAGNOSIS — M5430 Sciatica, unspecified side: Secondary | ICD-10-CM | POA: Diagnosis not present

## 2021-01-21 DIAGNOSIS — N390 Urinary tract infection, site not specified: Secondary | ICD-10-CM | POA: Diagnosis not present

## 2021-01-22 ENCOUNTER — Ambulatory Visit: Payer: Medicare Other | Admitting: Allergy & Immunology

## 2021-01-28 DIAGNOSIS — E1169 Type 2 diabetes mellitus with other specified complication: Secondary | ICD-10-CM | POA: Diagnosis not present

## 2021-01-28 DIAGNOSIS — K581 Irritable bowel syndrome with constipation: Secondary | ICD-10-CM | POA: Diagnosis not present

## 2021-01-28 DIAGNOSIS — I10 Essential (primary) hypertension: Secondary | ICD-10-CM | POA: Diagnosis not present

## 2021-01-28 DIAGNOSIS — E559 Vitamin D deficiency, unspecified: Secondary | ICD-10-CM | POA: Diagnosis not present

## 2021-01-28 DIAGNOSIS — L4059 Other psoriatic arthropathy: Secondary | ICD-10-CM | POA: Diagnosis not present

## 2021-01-28 DIAGNOSIS — M791 Myalgia, unspecified site: Secondary | ICD-10-CM | POA: Diagnosis not present

## 2021-01-28 DIAGNOSIS — E782 Mixed hyperlipidemia: Secondary | ICD-10-CM | POA: Diagnosis not present

## 2021-01-28 DIAGNOSIS — G894 Chronic pain syndrome: Secondary | ICD-10-CM | POA: Diagnosis not present

## 2021-01-28 DIAGNOSIS — K900 Celiac disease: Secondary | ICD-10-CM | POA: Diagnosis not present

## 2021-02-15 DIAGNOSIS — K581 Irritable bowel syndrome with constipation: Secondary | ICD-10-CM | POA: Diagnosis not present

## 2021-02-15 DIAGNOSIS — E1169 Type 2 diabetes mellitus with other specified complication: Secondary | ICD-10-CM | POA: Diagnosis not present

## 2021-02-15 DIAGNOSIS — G894 Chronic pain syndrome: Secondary | ICD-10-CM | POA: Diagnosis not present

## 2021-02-15 DIAGNOSIS — K900 Celiac disease: Secondary | ICD-10-CM | POA: Diagnosis not present

## 2021-02-15 DIAGNOSIS — I10 Essential (primary) hypertension: Secondary | ICD-10-CM | POA: Diagnosis not present

## 2021-02-15 DIAGNOSIS — E782 Mixed hyperlipidemia: Secondary | ICD-10-CM | POA: Diagnosis not present

## 2021-02-15 DIAGNOSIS — E559 Vitamin D deficiency, unspecified: Secondary | ICD-10-CM | POA: Diagnosis not present

## 2021-02-15 DIAGNOSIS — M791 Myalgia, unspecified site: Secondary | ICD-10-CM | POA: Diagnosis not present

## 2021-02-15 DIAGNOSIS — L4059 Other psoriatic arthropathy: Secondary | ICD-10-CM | POA: Diagnosis not present

## 2021-02-16 DIAGNOSIS — M5136 Other intervertebral disc degeneration, lumbar region: Secondary | ICD-10-CM | POA: Diagnosis not present

## 2021-02-16 DIAGNOSIS — M4316 Spondylolisthesis, lumbar region: Secondary | ICD-10-CM | POA: Diagnosis not present

## 2021-02-16 DIAGNOSIS — Z9889 Other specified postprocedural states: Secondary | ICD-10-CM | POA: Diagnosis not present

## 2021-02-16 DIAGNOSIS — M4030 Flatback syndrome, site unspecified: Secondary | ICD-10-CM | POA: Diagnosis not present

## 2021-02-16 DIAGNOSIS — M545 Low back pain, unspecified: Secondary | ICD-10-CM | POA: Diagnosis not present

## 2021-02-16 DIAGNOSIS — M4310 Spondylolisthesis, site unspecified: Secondary | ICD-10-CM | POA: Diagnosis not present

## 2021-02-16 DIAGNOSIS — G8929 Other chronic pain: Secondary | ICD-10-CM | POA: Diagnosis not present

## 2021-02-16 DIAGNOSIS — M4156 Other secondary scoliosis, lumbar region: Secondary | ICD-10-CM | POA: Diagnosis not present

## 2021-02-16 DIAGNOSIS — M48062 Spinal stenosis, lumbar region with neurogenic claudication: Secondary | ICD-10-CM | POA: Diagnosis not present

## 2021-02-18 DIAGNOSIS — L4059 Other psoriatic arthropathy: Secondary | ICD-10-CM | POA: Diagnosis not present

## 2021-02-26 ENCOUNTER — Ambulatory Visit: Payer: Medicare Other | Admitting: Allergy & Immunology

## 2021-03-17 DIAGNOSIS — E782 Mixed hyperlipidemia: Secondary | ICD-10-CM | POA: Diagnosis not present

## 2021-03-17 DIAGNOSIS — G720 Drug-induced myopathy: Secondary | ICD-10-CM | POA: Diagnosis not present

## 2021-03-17 DIAGNOSIS — E118 Type 2 diabetes mellitus with unspecified complications: Secondary | ICD-10-CM | POA: Diagnosis not present

## 2021-03-25 ENCOUNTER — Ambulatory Visit: Payer: Medicare Other | Admitting: Allergy & Immunology

## 2021-03-30 DIAGNOSIS — K581 Irritable bowel syndrome with constipation: Secondary | ICD-10-CM | POA: Diagnosis not present

## 2021-04-18 DIAGNOSIS — I10 Essential (primary) hypertension: Secondary | ICD-10-CM | POA: Diagnosis not present

## 2021-04-18 DIAGNOSIS — E1165 Type 2 diabetes mellitus with hyperglycemia: Secondary | ICD-10-CM | POA: Diagnosis not present

## 2021-04-20 DIAGNOSIS — M5430 Sciatica, unspecified side: Secondary | ICD-10-CM | POA: Diagnosis not present

## 2021-04-20 DIAGNOSIS — E782 Mixed hyperlipidemia: Secondary | ICD-10-CM | POA: Diagnosis not present

## 2021-04-20 DIAGNOSIS — I10 Essential (primary) hypertension: Secondary | ICD-10-CM | POA: Diagnosis not present

## 2021-04-20 DIAGNOSIS — E1165 Type 2 diabetes mellitus with hyperglycemia: Secondary | ICD-10-CM | POA: Diagnosis not present

## 2021-04-20 DIAGNOSIS — E559 Vitamin D deficiency, unspecified: Secondary | ICD-10-CM | POA: Diagnosis not present

## 2021-04-27 DIAGNOSIS — R809 Proteinuria, unspecified: Secondary | ICD-10-CM | POA: Diagnosis not present

## 2021-04-27 DIAGNOSIS — E782 Mixed hyperlipidemia: Secondary | ICD-10-CM | POA: Diagnosis not present

## 2021-04-27 DIAGNOSIS — R944 Abnormal results of kidney function studies: Secondary | ICD-10-CM | POA: Diagnosis not present

## 2021-04-27 DIAGNOSIS — L4050 Arthropathic psoriasis, unspecified: Secondary | ICD-10-CM | POA: Diagnosis not present

## 2021-04-27 DIAGNOSIS — I10 Essential (primary) hypertension: Secondary | ICD-10-CM | POA: Diagnosis not present

## 2021-04-27 DIAGNOSIS — E559 Vitamin D deficiency, unspecified: Secondary | ICD-10-CM | POA: Diagnosis not present

## 2021-04-27 DIAGNOSIS — K588 Other irritable bowel syndrome: Secondary | ICD-10-CM | POA: Diagnosis not present

## 2021-04-27 DIAGNOSIS — M545 Low back pain, unspecified: Secondary | ICD-10-CM | POA: Diagnosis not present

## 2021-04-27 DIAGNOSIS — M791 Myalgia, unspecified site: Secondary | ICD-10-CM | POA: Diagnosis not present

## 2021-04-27 DIAGNOSIS — E1169 Type 2 diabetes mellitus with other specified complication: Secondary | ICD-10-CM | POA: Diagnosis not present

## 2021-06-01 DIAGNOSIS — M545 Low back pain, unspecified: Secondary | ICD-10-CM | POA: Diagnosis not present

## 2021-06-17 DIAGNOSIS — D225 Melanocytic nevi of trunk: Secondary | ICD-10-CM | POA: Diagnosis not present

## 2021-06-17 DIAGNOSIS — L918 Other hypertrophic disorders of the skin: Secondary | ICD-10-CM | POA: Diagnosis not present

## 2021-06-17 DIAGNOSIS — Z85820 Personal history of malignant melanoma of skin: Secondary | ICD-10-CM | POA: Diagnosis not present

## 2021-06-17 DIAGNOSIS — L821 Other seborrheic keratosis: Secondary | ICD-10-CM | POA: Diagnosis not present

## 2021-06-22 DIAGNOSIS — M4030 Flatback syndrome, site unspecified: Secondary | ICD-10-CM | POA: Diagnosis not present

## 2021-06-22 DIAGNOSIS — M5136 Other intervertebral disc degeneration, lumbar region: Secondary | ICD-10-CM | POA: Diagnosis not present

## 2021-06-22 DIAGNOSIS — M4156 Other secondary scoliosis, lumbar region: Secondary | ICD-10-CM | POA: Diagnosis not present

## 2021-06-22 DIAGNOSIS — M4310 Spondylolisthesis, site unspecified: Secondary | ICD-10-CM | POA: Diagnosis not present

## 2021-06-22 DIAGNOSIS — Z9889 Other specified postprocedural states: Secondary | ICD-10-CM | POA: Diagnosis not present

## 2021-07-15 DIAGNOSIS — Z1231 Encounter for screening mammogram for malignant neoplasm of breast: Secondary | ICD-10-CM | POA: Diagnosis not present

## 2021-07-15 DIAGNOSIS — L4059 Other psoriatic arthropathy: Secondary | ICD-10-CM | POA: Diagnosis not present

## 2021-07-15 DIAGNOSIS — M5136 Other intervertebral disc degeneration, lumbar region: Secondary | ICD-10-CM | POA: Diagnosis not present

## 2021-07-15 DIAGNOSIS — R252 Cramp and spasm: Secondary | ICD-10-CM | POA: Diagnosis not present

## 2021-07-15 DIAGNOSIS — M818 Other osteoporosis without current pathological fracture: Secondary | ICD-10-CM | POA: Diagnosis not present

## 2021-07-15 DIAGNOSIS — M2550 Pain in unspecified joint: Secondary | ICD-10-CM | POA: Diagnosis not present

## 2021-07-18 DIAGNOSIS — E7800 Pure hypercholesterolemia, unspecified: Secondary | ICD-10-CM | POA: Diagnosis not present

## 2021-07-18 DIAGNOSIS — M810 Age-related osteoporosis without current pathological fracture: Secondary | ICD-10-CM | POA: Diagnosis not present

## 2021-07-29 DIAGNOSIS — M818 Other osteoporosis without current pathological fracture: Secondary | ICD-10-CM | POA: Diagnosis not present

## 2021-07-29 DIAGNOSIS — M816 Localized osteoporosis [Lequesne]: Secondary | ICD-10-CM | POA: Diagnosis not present

## 2021-07-29 DIAGNOSIS — M810 Age-related osteoporosis without current pathological fracture: Secondary | ICD-10-CM | POA: Diagnosis not present

## 2021-08-03 DIAGNOSIS — E1165 Type 2 diabetes mellitus with hyperglycemia: Secondary | ICD-10-CM | POA: Diagnosis not present

## 2021-08-03 DIAGNOSIS — I10 Essential (primary) hypertension: Secondary | ICD-10-CM | POA: Diagnosis not present

## 2021-08-10 DIAGNOSIS — Z0001 Encounter for general adult medical examination with abnormal findings: Secondary | ICD-10-CM | POA: Diagnosis not present

## 2021-08-10 DIAGNOSIS — R809 Proteinuria, unspecified: Secondary | ICD-10-CM | POA: Diagnosis not present

## 2021-08-10 DIAGNOSIS — E1169 Type 2 diabetes mellitus with other specified complication: Secondary | ICD-10-CM | POA: Diagnosis not present

## 2021-08-10 DIAGNOSIS — M791 Myalgia, unspecified site: Secondary | ICD-10-CM | POA: Diagnosis not present

## 2021-08-10 DIAGNOSIS — R944 Abnormal results of kidney function studies: Secondary | ICD-10-CM | POA: Diagnosis not present

## 2021-08-10 DIAGNOSIS — L4050 Arthropathic psoriasis, unspecified: Secondary | ICD-10-CM | POA: Diagnosis not present

## 2021-08-10 DIAGNOSIS — K588 Other irritable bowel syndrome: Secondary | ICD-10-CM | POA: Diagnosis not present

## 2021-08-10 DIAGNOSIS — M545 Low back pain, unspecified: Secondary | ICD-10-CM | POA: Diagnosis not present

## 2021-08-10 DIAGNOSIS — E782 Mixed hyperlipidemia: Secondary | ICD-10-CM | POA: Diagnosis not present

## 2021-08-10 DIAGNOSIS — E559 Vitamin D deficiency, unspecified: Secondary | ICD-10-CM | POA: Diagnosis not present

## 2021-08-18 DIAGNOSIS — E7800 Pure hypercholesterolemia, unspecified: Secondary | ICD-10-CM | POA: Diagnosis not present

## 2021-08-18 DIAGNOSIS — M810 Age-related osteoporosis without current pathological fracture: Secondary | ICD-10-CM | POA: Diagnosis not present

## 2021-08-20 DIAGNOSIS — R059 Cough, unspecified: Secondary | ICD-10-CM | POA: Diagnosis not present

## 2021-09-14 DIAGNOSIS — L03031 Cellulitis of right toe: Secondary | ICD-10-CM | POA: Diagnosis not present

## 2021-09-14 DIAGNOSIS — L820 Inflamed seborrheic keratosis: Secondary | ICD-10-CM | POA: Diagnosis not present

## 2021-09-14 DIAGNOSIS — Z85820 Personal history of malignant melanoma of skin: Secondary | ICD-10-CM | POA: Diagnosis not present

## 2021-10-08 ENCOUNTER — Encounter: Payer: Self-pay | Admitting: Podiatry

## 2021-10-08 ENCOUNTER — Ambulatory Visit: Payer: Medicare Other | Admitting: Podiatry

## 2021-10-08 ENCOUNTER — Other Ambulatory Visit: Payer: Self-pay

## 2021-10-08 DIAGNOSIS — L600 Ingrowing nail: Secondary | ICD-10-CM

## 2021-10-08 DIAGNOSIS — M51369 Other intervertebral disc degeneration, lumbar region without mention of lumbar back pain or lower extremity pain: Secondary | ICD-10-CM | POA: Insufficient documentation

## 2021-10-08 DIAGNOSIS — M5136 Other intervertebral disc degeneration, lumbar region: Secondary | ICD-10-CM | POA: Insufficient documentation

## 2021-10-08 DIAGNOSIS — L4059 Other psoriatic arthropathy: Secondary | ICD-10-CM | POA: Insufficient documentation

## 2021-10-08 DIAGNOSIS — E663 Overweight: Secondary | ICD-10-CM | POA: Insufficient documentation

## 2021-10-08 DIAGNOSIS — M2550 Pain in unspecified joint: Secondary | ICD-10-CM | POA: Insufficient documentation

## 2021-10-08 NOTE — Patient Instructions (Signed)

## 2021-10-11 NOTE — Progress Notes (Signed)
Subjective:   Patient ID: [name], female   DOB: 65 y.o.   MRN: [id number]   HPI Patient presents stating that she has had chronic ingrown toenails of her big toes of both feet and they bothered her for a long time.  States they have gotten worse over the last few weeks she is tried soaking she is tried Neosporin and pedicures.  Patient has diabetes under good control does not smoke likes to be active   Review of Systems  All other systems reviewed and are negative.      Objective:  Physical Exam Vitals and nursing note reviewed.  Constitutional:      Appearance: She is well-developed.  Pulmonary:     Effort: Pulmonary effort is normal.  Musculoskeletal:        General: Normal range of motion.  Skin:    General: Skin is warm.  Neurological:     Mental Status: She is alert.    Neurovascular status found to be intact muscle strength was found to be adequate range of motion adequate.  Patient is found to have incurvation of the lateral borders of the hallux bilateral that are painful when pressed with slight redness no drainage noted and obvious structural damage to the nailbed itself.  Patient is found to have good digital perfusion well oriented x3     Assessment:  Chronic ingrown toenail deformity hallux bilateral lateral borders with pain     Plan:  H&P reviewed condition recommended correction.  I explained procedure risk and patient wants surgical correction I allowed her to read and then signed consent form understanding risk.  Today I infiltrated each big toe 60 mg Xylocaine Marcaine mixture sterile prep done and using sterile instrumentation I remove the lateral borders exposed matrix applied phenol 3 applications 30 seconds to each border followed by alcohol lavage sterile dressing and gave instructions on soaks and to leave dressing on 24 hours but take it off earlier if any throbbing were to occur and encouraged her to call with questions concerns or

## 2021-10-12 DIAGNOSIS — L4059 Other psoriatic arthropathy: Secondary | ICD-10-CM | POA: Diagnosis not present

## 2021-10-14 ENCOUNTER — Telehealth: Payer: Self-pay | Admitting: *Deleted

## 2021-10-14 NOTE — Telephone Encounter (Signed)
Patient is calling because her toe is red at the base of the nail since procedure on the right foot. She has been applying neosporin(should she discontinue it?) not seem to be improving.She is a diabetic and has some concerns.

## 2021-10-15 NOTE — Telephone Encounter (Signed)
Ok for neosporin. As long as it does not spread proximal should be ok. Normal to see a little redness due to chemical application. If spreading she needs to come in and have checked

## 2021-10-20 NOTE — Telephone Encounter (Signed)
Returned the  call to patient giving recommendations per physician,verbalized understanding. 

## 2021-11-05 DIAGNOSIS — L738 Other specified follicular disorders: Secondary | ICD-10-CM | POA: Diagnosis not present

## 2021-11-05 DIAGNOSIS — L570 Actinic keratosis: Secondary | ICD-10-CM | POA: Diagnosis not present

## 2021-11-05 DIAGNOSIS — Z85820 Personal history of malignant melanoma of skin: Secondary | ICD-10-CM | POA: Diagnosis not present

## 2021-11-17 DIAGNOSIS — I10 Essential (primary) hypertension: Secondary | ICD-10-CM | POA: Diagnosis not present

## 2021-11-17 DIAGNOSIS — E782 Mixed hyperlipidemia: Secondary | ICD-10-CM | POA: Diagnosis not present

## 2021-12-03 DIAGNOSIS — E782 Mixed hyperlipidemia: Secondary | ICD-10-CM | POA: Diagnosis not present

## 2021-12-03 DIAGNOSIS — E1169 Type 2 diabetes mellitus with other specified complication: Secondary | ICD-10-CM | POA: Diagnosis not present

## 2021-12-07 DIAGNOSIS — M791 Myalgia, unspecified site: Secondary | ICD-10-CM | POA: Diagnosis not present

## 2021-12-07 DIAGNOSIS — E782 Mixed hyperlipidemia: Secondary | ICD-10-CM | POA: Diagnosis not present

## 2021-12-07 DIAGNOSIS — E559 Vitamin D deficiency, unspecified: Secondary | ICD-10-CM | POA: Diagnosis not present

## 2021-12-07 DIAGNOSIS — M545 Low back pain, unspecified: Secondary | ICD-10-CM | POA: Diagnosis not present

## 2021-12-07 DIAGNOSIS — R944 Abnormal results of kidney function studies: Secondary | ICD-10-CM | POA: Diagnosis not present

## 2021-12-07 DIAGNOSIS — R809 Proteinuria, unspecified: Secondary | ICD-10-CM | POA: Diagnosis not present

## 2021-12-07 DIAGNOSIS — L4050 Arthropathic psoriasis, unspecified: Secondary | ICD-10-CM | POA: Diagnosis not present

## 2021-12-07 DIAGNOSIS — I10 Essential (primary) hypertension: Secondary | ICD-10-CM | POA: Diagnosis not present

## 2021-12-07 DIAGNOSIS — E1169 Type 2 diabetes mellitus with other specified complication: Secondary | ICD-10-CM | POA: Diagnosis not present

## 2021-12-07 DIAGNOSIS — K588 Other irritable bowel syndrome: Secondary | ICD-10-CM | POA: Diagnosis not present

## 2021-12-09 DIAGNOSIS — M48062 Spinal stenosis, lumbar region with neurogenic claudication: Secondary | ICD-10-CM | POA: Diagnosis not present

## 2021-12-09 DIAGNOSIS — M5136 Other intervertebral disc degeneration, lumbar region: Secondary | ICD-10-CM | POA: Diagnosis not present

## 2021-12-09 DIAGNOSIS — M4030 Flatback syndrome, site unspecified: Secondary | ICD-10-CM | POA: Diagnosis not present

## 2021-12-09 DIAGNOSIS — M4807 Spinal stenosis, lumbosacral region: Secondary | ICD-10-CM | POA: Diagnosis not present

## 2021-12-09 DIAGNOSIS — M4310 Spondylolisthesis, site unspecified: Secondary | ICD-10-CM | POA: Diagnosis not present

## 2021-12-14 DIAGNOSIS — U071 COVID-19: Secondary | ICD-10-CM | POA: Diagnosis not present

## 2022-01-18 DIAGNOSIS — Z9889 Other specified postprocedural states: Secondary | ICD-10-CM | POA: Diagnosis not present

## 2022-01-18 DIAGNOSIS — M47816 Spondylosis without myelopathy or radiculopathy, lumbar region: Secondary | ICD-10-CM | POA: Diagnosis not present

## 2022-01-18 DIAGNOSIS — M5136 Other intervertebral disc degeneration, lumbar region: Secondary | ICD-10-CM | POA: Diagnosis not present

## 2022-01-18 DIAGNOSIS — M4310 Spondylolisthesis, site unspecified: Secondary | ICD-10-CM | POA: Diagnosis not present

## 2022-01-18 DIAGNOSIS — M4030 Flatback syndrome, site unspecified: Secondary | ICD-10-CM | POA: Diagnosis not present

## 2022-01-18 DIAGNOSIS — M4156 Other secondary scoliosis, lumbar region: Secondary | ICD-10-CM | POA: Diagnosis not present

## 2022-01-18 DIAGNOSIS — M48061 Spinal stenosis, lumbar region without neurogenic claudication: Secondary | ICD-10-CM | POA: Diagnosis not present

## 2022-01-18 DIAGNOSIS — M4807 Spinal stenosis, lumbosacral region: Secondary | ICD-10-CM | POA: Diagnosis not present

## 2022-01-27 DIAGNOSIS — M5136 Other intervertebral disc degeneration, lumbar region: Secondary | ICD-10-CM | POA: Diagnosis not present

## 2022-01-27 DIAGNOSIS — L4059 Other psoriatic arthropathy: Secondary | ICD-10-CM | POA: Diagnosis not present

## 2022-01-27 DIAGNOSIS — N1831 Chronic kidney disease, stage 3a: Secondary | ICD-10-CM | POA: Diagnosis not present

## 2022-01-27 DIAGNOSIS — R252 Cramp and spasm: Secondary | ICD-10-CM | POA: Diagnosis not present

## 2022-02-01 DIAGNOSIS — E1169 Type 2 diabetes mellitus with other specified complication: Secondary | ICD-10-CM | POA: Diagnosis not present

## 2022-02-22 DIAGNOSIS — K581 Irritable bowel syndrome with constipation: Secondary | ICD-10-CM | POA: Diagnosis not present

## 2022-03-03 DIAGNOSIS — E1169 Type 2 diabetes mellitus with other specified complication: Secondary | ICD-10-CM | POA: Diagnosis not present

## 2022-03-03 DIAGNOSIS — E782 Mixed hyperlipidemia: Secondary | ICD-10-CM | POA: Diagnosis not present

## 2022-03-09 DIAGNOSIS — E782 Mixed hyperlipidemia: Secondary | ICD-10-CM | POA: Diagnosis not present

## 2022-03-09 DIAGNOSIS — M791 Myalgia, unspecified site: Secondary | ICD-10-CM | POA: Diagnosis not present

## 2022-03-09 DIAGNOSIS — K588 Other irritable bowel syndrome: Secondary | ICD-10-CM | POA: Diagnosis not present

## 2022-03-09 DIAGNOSIS — R944 Abnormal results of kidney function studies: Secondary | ICD-10-CM | POA: Diagnosis not present

## 2022-03-09 DIAGNOSIS — R809 Proteinuria, unspecified: Secondary | ICD-10-CM | POA: Diagnosis not present

## 2022-03-09 DIAGNOSIS — L4050 Arthropathic psoriasis, unspecified: Secondary | ICD-10-CM | POA: Diagnosis not present

## 2022-03-09 DIAGNOSIS — E1169 Type 2 diabetes mellitus with other specified complication: Secondary | ICD-10-CM | POA: Diagnosis not present

## 2022-03-09 DIAGNOSIS — E559 Vitamin D deficiency, unspecified: Secondary | ICD-10-CM | POA: Diagnosis not present

## 2022-03-09 DIAGNOSIS — M545 Low back pain, unspecified: Secondary | ICD-10-CM | POA: Diagnosis not present

## 2022-03-09 DIAGNOSIS — I10 Essential (primary) hypertension: Secondary | ICD-10-CM | POA: Diagnosis not present

## 2022-03-29 DIAGNOSIS — I10 Essential (primary) hypertension: Secondary | ICD-10-CM | POA: Diagnosis not present

## 2022-03-29 DIAGNOSIS — E1169 Type 2 diabetes mellitus with other specified complication: Secondary | ICD-10-CM | POA: Diagnosis not present

## 2022-04-21 DIAGNOSIS — H04123 Dry eye syndrome of bilateral lacrimal glands: Secondary | ICD-10-CM | POA: Diagnosis not present

## 2022-04-26 DIAGNOSIS — L4059 Other psoriatic arthropathy: Secondary | ICD-10-CM | POA: Diagnosis not present

## 2022-05-12 ENCOUNTER — Other Ambulatory Visit: Payer: Self-pay | Admitting: Family Medicine

## 2022-05-12 ENCOUNTER — Other Ambulatory Visit (HOSPITAL_COMMUNITY): Payer: Self-pay | Admitting: Family Medicine

## 2022-05-12 ENCOUNTER — Ambulatory Visit (HOSPITAL_COMMUNITY)
Admission: RE | Admit: 2022-05-12 | Discharge: 2022-05-12 | Disposition: A | Discharge status: Home / Self Care | Payer: Medicare Other | Source: Ambulatory Visit | Attending: Family Medicine | Admitting: Family Medicine

## 2022-05-12 DIAGNOSIS — R109 Unspecified abdominal pain: Secondary | ICD-10-CM | POA: Diagnosis not present

## 2022-05-12 DIAGNOSIS — K7689 Other specified diseases of liver: Secondary | ICD-10-CM | POA: Diagnosis not present

## 2022-05-12 DIAGNOSIS — K581 Irritable bowel syndrome with constipation: Secondary | ICD-10-CM | POA: Diagnosis not present

## 2022-06-10 DIAGNOSIS — E1169 Type 2 diabetes mellitus with other specified complication: Secondary | ICD-10-CM | POA: Diagnosis not present

## 2022-06-10 DIAGNOSIS — I10 Essential (primary) hypertension: Secondary | ICD-10-CM | POA: Diagnosis not present

## 2022-06-10 DIAGNOSIS — E559 Vitamin D deficiency, unspecified: Secondary | ICD-10-CM | POA: Diagnosis not present

## 2022-06-14 DIAGNOSIS — L281 Prurigo nodularis: Secondary | ICD-10-CM | POA: Diagnosis not present

## 2022-06-14 DIAGNOSIS — L821 Other seborrheic keratosis: Secondary | ICD-10-CM | POA: Diagnosis not present

## 2022-06-14 DIAGNOSIS — L700 Acne vulgaris: Secondary | ICD-10-CM | POA: Diagnosis not present

## 2022-06-14 DIAGNOSIS — D225 Melanocytic nevi of trunk: Secondary | ICD-10-CM | POA: Diagnosis not present

## 2022-06-14 DIAGNOSIS — Z85820 Personal history of malignant melanoma of skin: Secondary | ICD-10-CM | POA: Diagnosis not present

## 2022-06-16 DIAGNOSIS — M810 Age-related osteoporosis without current pathological fracture: Secondary | ICD-10-CM | POA: Diagnosis not present

## 2022-06-16 DIAGNOSIS — K588 Other irritable bowel syndrome: Secondary | ICD-10-CM | POA: Diagnosis not present

## 2022-06-16 DIAGNOSIS — R944 Abnormal results of kidney function studies: Secondary | ICD-10-CM | POA: Diagnosis not present

## 2022-06-16 DIAGNOSIS — E559 Vitamin D deficiency, unspecified: Secondary | ICD-10-CM | POA: Diagnosis not present

## 2022-06-16 DIAGNOSIS — M791 Myalgia, unspecified site: Secondary | ICD-10-CM | POA: Diagnosis not present

## 2022-06-16 DIAGNOSIS — M545 Low back pain, unspecified: Secondary | ICD-10-CM | POA: Diagnosis not present

## 2022-06-16 DIAGNOSIS — E782 Mixed hyperlipidemia: Secondary | ICD-10-CM | POA: Diagnosis not present

## 2022-06-16 DIAGNOSIS — I10 Essential (primary) hypertension: Secondary | ICD-10-CM | POA: Diagnosis not present

## 2022-06-16 DIAGNOSIS — L4050 Arthropathic psoriasis, unspecified: Secondary | ICD-10-CM | POA: Diagnosis not present

## 2022-06-16 DIAGNOSIS — E1169 Type 2 diabetes mellitus with other specified complication: Secondary | ICD-10-CM | POA: Diagnosis not present

## 2022-07-07 ENCOUNTER — Ambulatory Visit: Payer: Medicare Other | Admitting: Podiatry

## 2022-07-07 ENCOUNTER — Encounter: Payer: Self-pay | Admitting: Podiatry

## 2022-07-07 DIAGNOSIS — M79675 Pain in left toe(s): Secondary | ICD-10-CM | POA: Diagnosis not present

## 2022-07-07 DIAGNOSIS — B351 Tinea unguium: Secondary | ICD-10-CM | POA: Diagnosis not present

## 2022-07-07 DIAGNOSIS — M79674 Pain in right toe(s): Secondary | ICD-10-CM

## 2022-07-07 NOTE — Progress Notes (Signed)
Subjective:   Patient ID: [name], female   DOB: 66 y.o.   MRN: [id number]   HPI Patient presents stating she has diabetes cannot cut her toenails or is been some redness and she was just concerned and soreness in the nailbeds   ROS      Objective:  Physical Exam  Neurovascular status unchanged with incurvated nail bed with slight thickness of the hallux and several other nails bilateral and patient is looking also for a long-term good pedicurist with history of diabetes     Assessment:  Mycotic nail infection 1-5 both feet with discomfort along with diabetes     Plan:  H&P reviewed continue diabetic education evaluation debrided nailbeds 1-5 both feet neurogenic bleeding and gave name of somewhat right thing to do good routine care for this patient

## 2022-07-19 DIAGNOSIS — M4036 Flatback syndrome, lumbar region: Secondary | ICD-10-CM | POA: Diagnosis not present

## 2022-07-19 DIAGNOSIS — M519 Unspecified thoracic, thoracolumbar and lumbosacral intervertebral disc disorder: Secondary | ICD-10-CM | POA: Diagnosis not present

## 2022-07-19 DIAGNOSIS — M25551 Pain in right hip: Secondary | ICD-10-CM | POA: Diagnosis not present

## 2022-08-02 DIAGNOSIS — M5136 Other intervertebral disc degeneration, lumbar region: Secondary | ICD-10-CM | POA: Diagnosis not present

## 2022-08-02 DIAGNOSIS — N1831 Chronic kidney disease, stage 3a: Secondary | ICD-10-CM | POA: Diagnosis not present

## 2022-08-02 DIAGNOSIS — R252 Cramp and spasm: Secondary | ICD-10-CM | POA: Diagnosis not present

## 2022-08-02 DIAGNOSIS — Z6823 Body mass index (BMI) 23.0-23.9, adult: Secondary | ICD-10-CM | POA: Diagnosis not present

## 2022-08-02 DIAGNOSIS — L4059 Other psoriatic arthropathy: Secondary | ICD-10-CM | POA: Diagnosis not present

## 2022-10-14 DIAGNOSIS — M25562 Pain in left knee: Secondary | ICD-10-CM | POA: Diagnosis not present

## 2022-10-14 DIAGNOSIS — M545 Low back pain, unspecified: Secondary | ICD-10-CM | POA: Diagnosis not present

## 2022-10-14 DIAGNOSIS — E1169 Type 2 diabetes mellitus with other specified complication: Secondary | ICD-10-CM | POA: Diagnosis not present

## 2022-11-03 DIAGNOSIS — L4059 Other psoriatic arthropathy: Secondary | ICD-10-CM | POA: Diagnosis not present

## 2022-12-02 IMAGING — US US ABDOMEN COMPLETE
1 series · 13 of 25 positions shown · non-contrast
Comparison: CT abdomen/pelvis 09/27/2010. Report from CT
abdomen/pelvis 02/06/2012 (images unavailable).

CLINICAL DATA: Provided history: Abdominal pain, unspecified
abdominal location.

EXAM:
ABDOMEN ULTRASOUND COMPLETE

[Series 1: us abdomen complete · 13 of 114 slices shown]
[im 1/114]
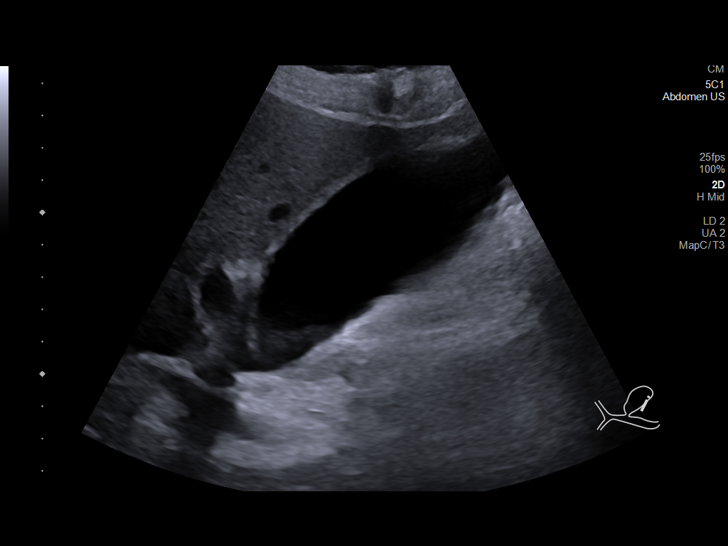
[im 10/114]
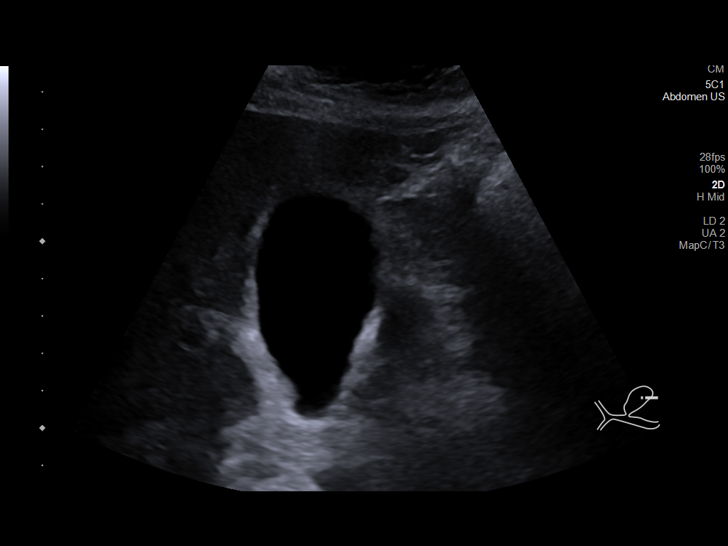
[im 19/114]
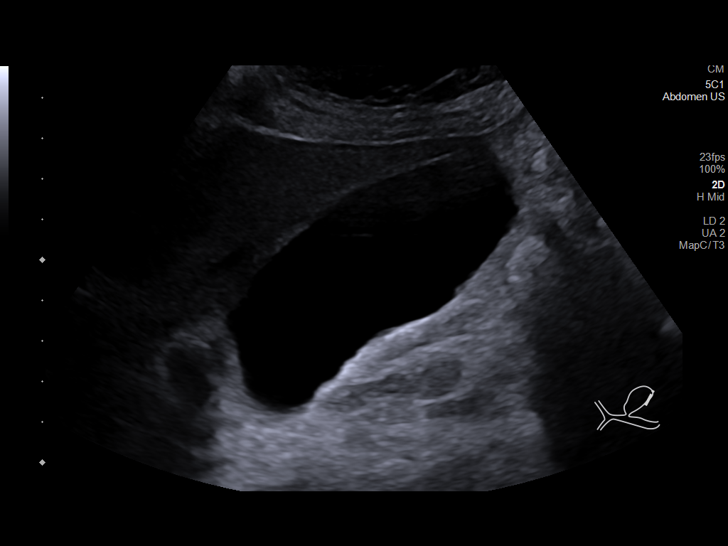
[im 29/114]
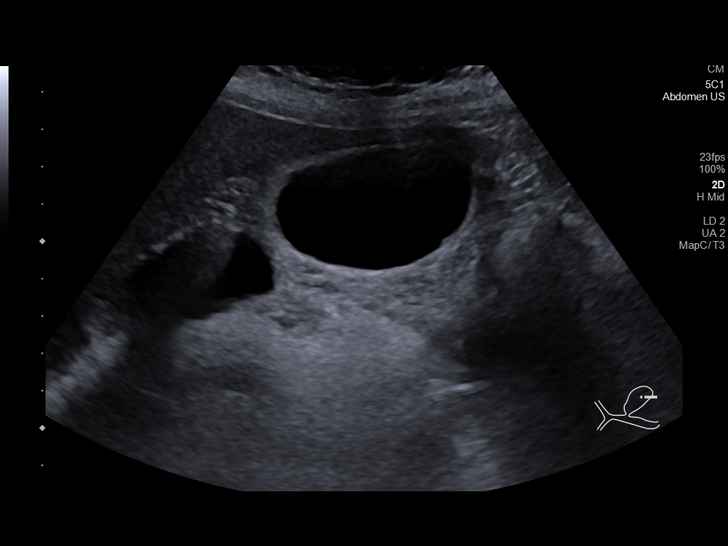
[im 38/114]
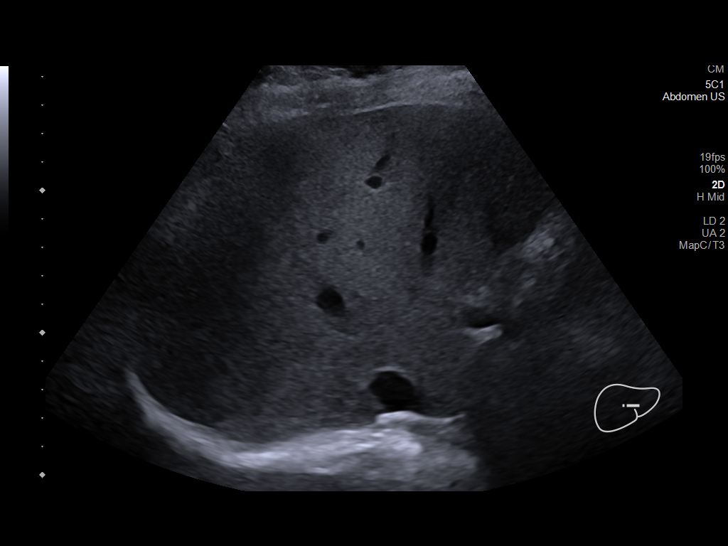
[im 48/114]
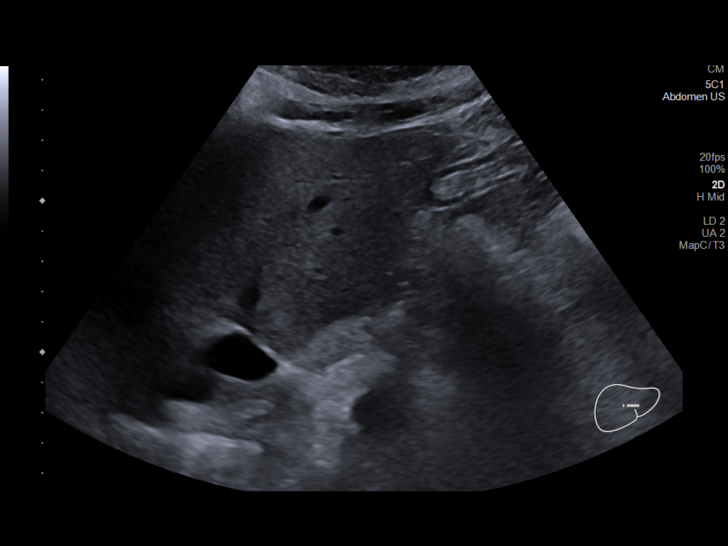
[im 57/114]
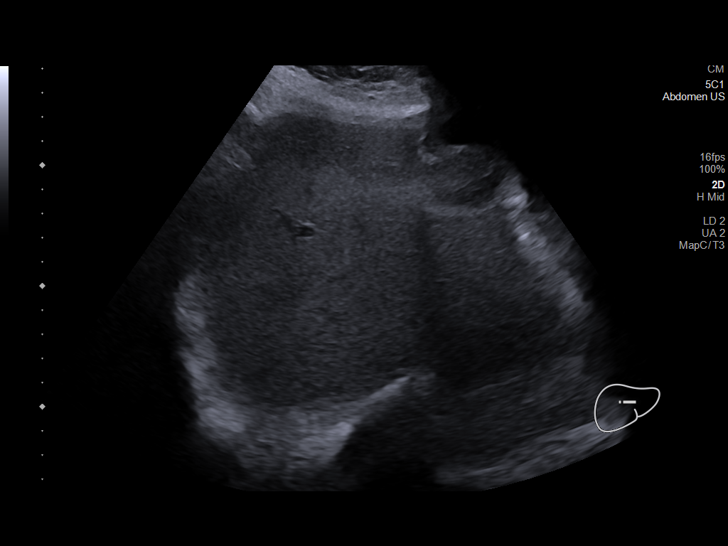
[im 66/114]
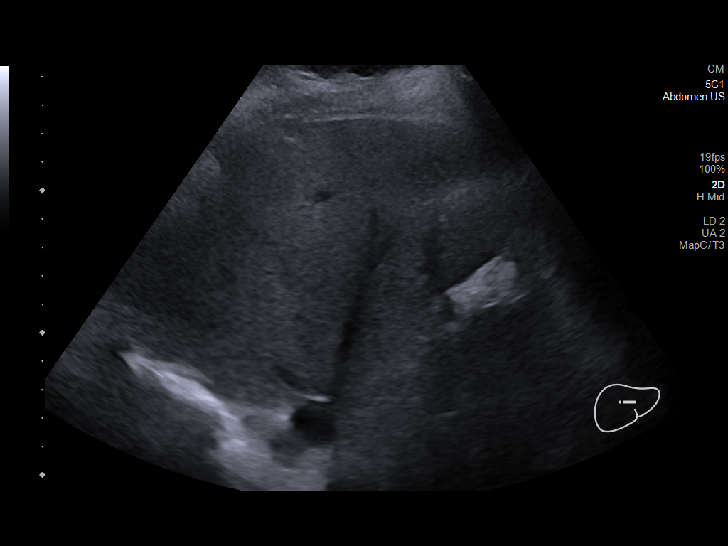
[im 76/114]
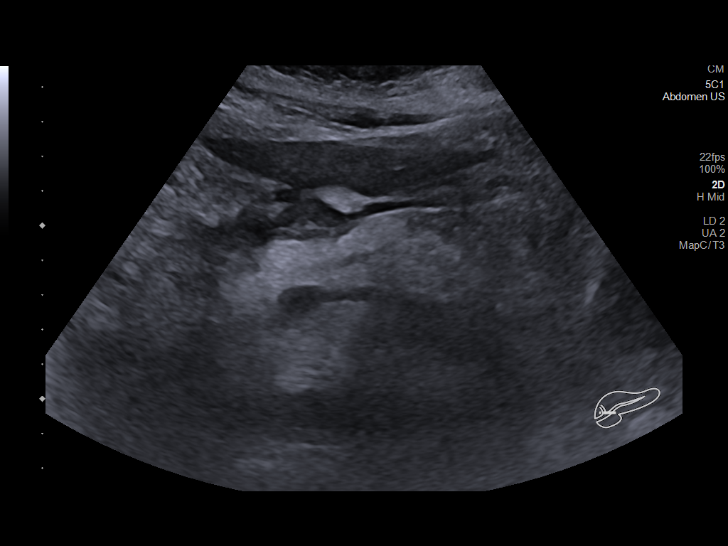
[im 85/114]
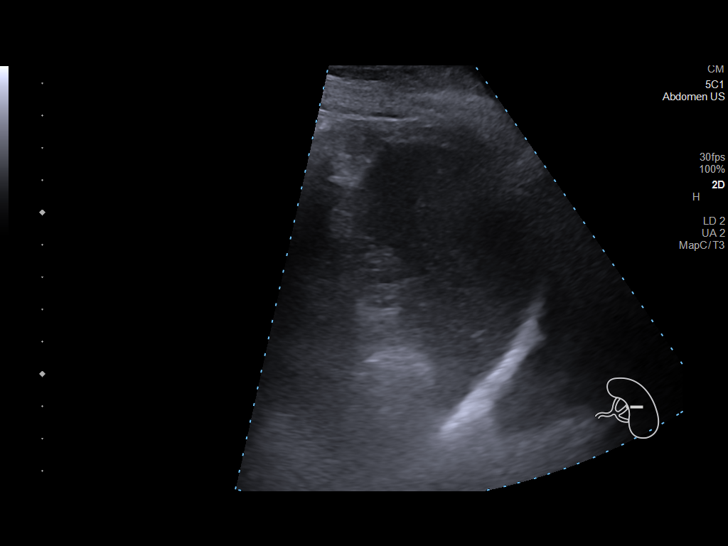
[im 95/114]
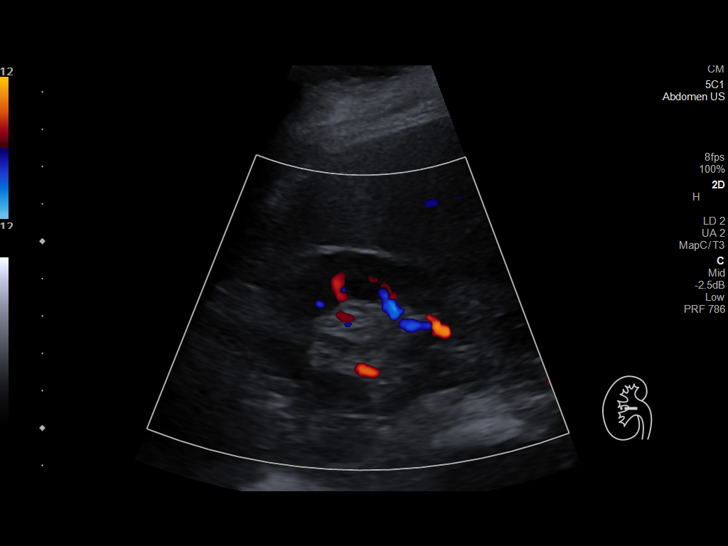
[im 104/114]
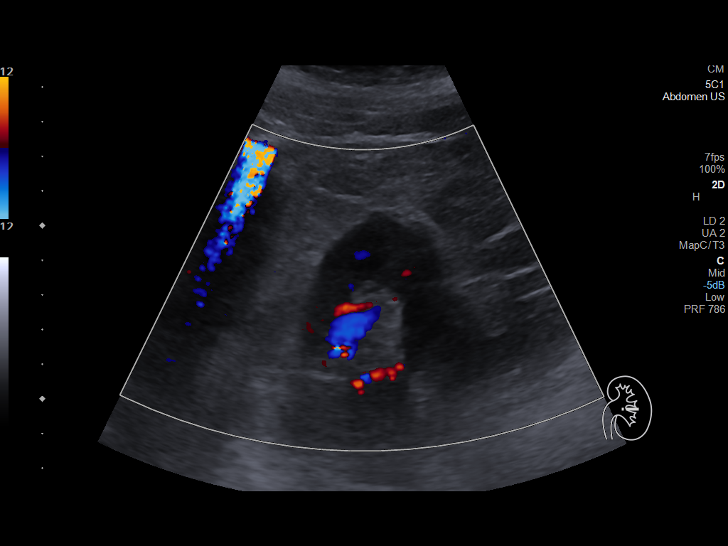
[im 114/114]
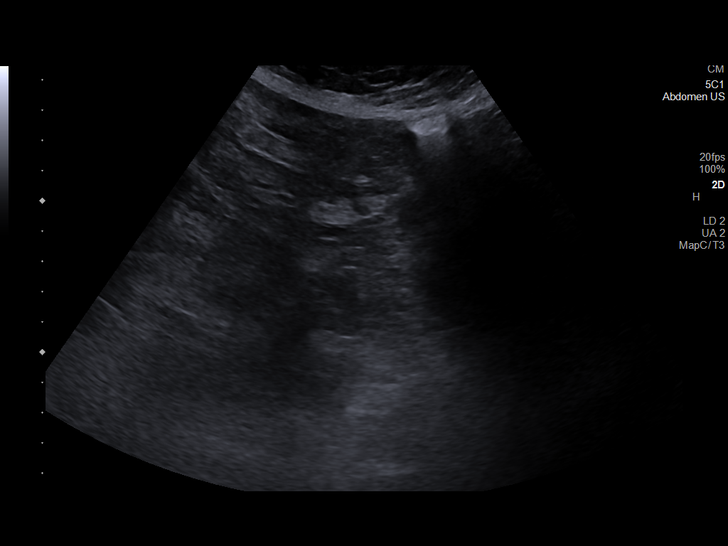

[13 of 25 positions shown; findings below may reference images not displayed]

FINDINGS: Gallbladder: No gallstones or wall thickening visualized. No
sonographic Murphy sign noted by sonographer.

Common bile duct: Diameter: 6 mm, within normal limits.

Liver: No focal lesion identified. Mildly increased hepatic
parenchymal echogenicity. Portal vein is patent on color Doppler
imaging with normal direction of blood flow towards the liver.

IVC: No abnormality visualized.

Pancreas: Visualized portion unremarkable.

Spleen: Size and appearance within normal limits.

Right Kidney: Length: 10.7 cm. Echogenicity within normal limits. No
mass or hydronephrosis visualized.

Left Kidney: Length: 10.6 cm. Echogenicity within normal limits. No
mass or hydronephrosis visualized.

Abdominal aorta: The distal aorta is obscured by overlying bowel
gas. Within this limitation, no aneurysm is visualized.
IMPRESSION: Hyperechogenicity of the hepatic parenchyma. This is a nonspecific
finding, which may be seen in the setting of hepatic steatosis or
other chronic hepatic parenchymal disease.

The distal aorta is obscured by overlying bowel gas.

Otherwise unremarkable abdominal ultrasound, as described.

## 2022-12-06 DIAGNOSIS — I10 Essential (primary) hypertension: Secondary | ICD-10-CM | POA: Diagnosis not present

## 2022-12-06 DIAGNOSIS — E1169 Type 2 diabetes mellitus with other specified complication: Secondary | ICD-10-CM | POA: Diagnosis not present

## 2022-12-06 DIAGNOSIS — E559 Vitamin D deficiency, unspecified: Secondary | ICD-10-CM | POA: Diagnosis not present

## 2022-12-13 DIAGNOSIS — M545 Low back pain, unspecified: Secondary | ICD-10-CM | POA: Diagnosis not present

## 2022-12-13 DIAGNOSIS — E1122 Type 2 diabetes mellitus with diabetic chronic kidney disease: Secondary | ICD-10-CM | POA: Diagnosis not present

## 2022-12-13 DIAGNOSIS — N1831 Chronic kidney disease, stage 3a: Secondary | ICD-10-CM | POA: Diagnosis not present

## 2022-12-13 DIAGNOSIS — E782 Mixed hyperlipidemia: Secondary | ICD-10-CM | POA: Diagnosis not present

## 2022-12-13 DIAGNOSIS — Z0000 Encounter for general adult medical examination without abnormal findings: Secondary | ICD-10-CM | POA: Diagnosis not present

## 2022-12-13 DIAGNOSIS — K588 Other irritable bowel syndrome: Secondary | ICD-10-CM | POA: Diagnosis not present

## 2022-12-13 DIAGNOSIS — Z0001 Encounter for general adult medical examination with abnormal findings: Secondary | ICD-10-CM | POA: Diagnosis not present

## 2022-12-13 DIAGNOSIS — E1169 Type 2 diabetes mellitus with other specified complication: Secondary | ICD-10-CM | POA: Diagnosis not present

## 2022-12-13 DIAGNOSIS — M79601 Pain in right arm: Secondary | ICD-10-CM | POA: Diagnosis not present

## 2022-12-13 DIAGNOSIS — I129 Hypertensive chronic kidney disease with stage 1 through stage 4 chronic kidney disease, or unspecified chronic kidney disease: Secondary | ICD-10-CM | POA: Diagnosis not present

## 2023-02-14 DIAGNOSIS — J302 Other seasonal allergic rhinitis: Secondary | ICD-10-CM | POA: Diagnosis not present

## 2023-02-14 DIAGNOSIS — G8929 Other chronic pain: Secondary | ICD-10-CM | POA: Diagnosis not present

## 2023-02-14 DIAGNOSIS — E1169 Type 2 diabetes mellitus with other specified complication: Secondary | ICD-10-CM | POA: Diagnosis not present

## 2023-02-14 DIAGNOSIS — M545 Low back pain, unspecified: Secondary | ICD-10-CM | POA: Diagnosis not present

## 2023-02-14 DIAGNOSIS — E119 Type 2 diabetes mellitus without complications: Secondary | ICD-10-CM | POA: Diagnosis not present

## 2023-02-21 DIAGNOSIS — R252 Cramp and spasm: Secondary | ICD-10-CM | POA: Diagnosis not present

## 2023-02-21 DIAGNOSIS — L4059 Other psoriatic arthropathy: Secondary | ICD-10-CM | POA: Diagnosis not present

## 2023-02-21 DIAGNOSIS — M5136 Other intervertebral disc degeneration, lumbar region: Secondary | ICD-10-CM | POA: Diagnosis not present

## 2023-02-21 DIAGNOSIS — N1831 Chronic kidney disease, stage 3a: Secondary | ICD-10-CM | POA: Diagnosis not present

## 2023-05-05 DIAGNOSIS — I10 Essential (primary) hypertension: Secondary | ICD-10-CM | POA: Diagnosis not present

## 2023-05-05 DIAGNOSIS — E782 Mixed hyperlipidemia: Secondary | ICD-10-CM | POA: Diagnosis not present

## 2023-05-05 DIAGNOSIS — E559 Vitamin D deficiency, unspecified: Secondary | ICD-10-CM | POA: Diagnosis not present

## 2023-05-05 DIAGNOSIS — E1169 Type 2 diabetes mellitus with other specified complication: Secondary | ICD-10-CM | POA: Diagnosis not present

## 2023-05-09 DIAGNOSIS — J302 Other seasonal allergic rhinitis: Secondary | ICD-10-CM | POA: Diagnosis not present

## 2023-05-09 DIAGNOSIS — N1831 Chronic kidney disease, stage 3a: Secondary | ICD-10-CM | POA: Diagnosis not present

## 2023-05-09 DIAGNOSIS — E1169 Type 2 diabetes mellitus with other specified complication: Secondary | ICD-10-CM | POA: Diagnosis not present

## 2023-05-09 DIAGNOSIS — K588 Other irritable bowel syndrome: Secondary | ICD-10-CM | POA: Diagnosis not present

## 2023-05-09 DIAGNOSIS — E782 Mixed hyperlipidemia: Secondary | ICD-10-CM | POA: Diagnosis not present

## 2023-05-09 DIAGNOSIS — G720 Drug-induced myopathy: Secondary | ICD-10-CM | POA: Diagnosis not present

## 2023-05-09 DIAGNOSIS — I10 Essential (primary) hypertension: Secondary | ICD-10-CM | POA: Diagnosis not present

## 2023-05-09 DIAGNOSIS — M545 Low back pain, unspecified: Secondary | ICD-10-CM | POA: Diagnosis not present

## 2023-05-09 DIAGNOSIS — E1122 Type 2 diabetes mellitus with diabetic chronic kidney disease: Secondary | ICD-10-CM | POA: Diagnosis not present

## 2023-05-09 DIAGNOSIS — I129 Hypertensive chronic kidney disease with stage 1 through stage 4 chronic kidney disease, or unspecified chronic kidney disease: Secondary | ICD-10-CM | POA: Diagnosis not present

## 2023-05-09 DIAGNOSIS — E559 Vitamin D deficiency, unspecified: Secondary | ICD-10-CM | POA: Diagnosis not present

## 2023-05-18 ENCOUNTER — Encounter: Payer: Self-pay | Admitting: *Deleted

## 2023-05-30 DIAGNOSIS — L4059 Other psoriatic arthropathy: Secondary | ICD-10-CM | POA: Diagnosis not present

## 2023-08-29 DIAGNOSIS — M79601 Pain in right arm: Secondary | ICD-10-CM | POA: Diagnosis not present

## 2023-08-29 DIAGNOSIS — B372 Candidiasis of skin and nail: Secondary | ICD-10-CM | POA: Diagnosis not present

## 2023-08-29 DIAGNOSIS — Z7985 Long-term (current) use of injectable non-insulin antidiabetic drugs: Secondary | ICD-10-CM | POA: Diagnosis not present

## 2023-08-29 DIAGNOSIS — E1169 Type 2 diabetes mellitus with other specified complication: Secondary | ICD-10-CM | POA: Diagnosis not present

## 2023-08-29 DIAGNOSIS — E1165 Type 2 diabetes mellitus with hyperglycemia: Secondary | ICD-10-CM | POA: Diagnosis not present

## 2023-09-01 DIAGNOSIS — E1169 Type 2 diabetes mellitus with other specified complication: Secondary | ICD-10-CM | POA: Diagnosis not present

## 2023-09-01 DIAGNOSIS — E559 Vitamin D deficiency, unspecified: Secondary | ICD-10-CM | POA: Diagnosis not present

## 2023-09-01 DIAGNOSIS — E782 Mixed hyperlipidemia: Secondary | ICD-10-CM | POA: Diagnosis not present

## 2023-09-14 ENCOUNTER — Other Ambulatory Visit: Payer: Self-pay

## 2023-09-14 ENCOUNTER — Other Ambulatory Visit (HOSPITAL_COMMUNITY): Payer: Self-pay

## 2023-09-14 DIAGNOSIS — Z0001 Encounter for general adult medical examination with abnormal findings: Secondary | ICD-10-CM | POA: Diagnosis not present

## 2023-09-14 DIAGNOSIS — M545 Low back pain, unspecified: Secondary | ICD-10-CM | POA: Diagnosis not present

## 2023-09-14 DIAGNOSIS — E782 Mixed hyperlipidemia: Secondary | ICD-10-CM | POA: Diagnosis not present

## 2023-09-14 DIAGNOSIS — E1122 Type 2 diabetes mellitus with diabetic chronic kidney disease: Secondary | ICD-10-CM | POA: Diagnosis not present

## 2023-09-14 DIAGNOSIS — I10 Essential (primary) hypertension: Secondary | ICD-10-CM | POA: Diagnosis not present

## 2023-09-14 DIAGNOSIS — N1831 Chronic kidney disease, stage 3a: Secondary | ICD-10-CM | POA: Diagnosis not present

## 2023-09-14 DIAGNOSIS — I129 Hypertensive chronic kidney disease with stage 1 through stage 4 chronic kidney disease, or unspecified chronic kidney disease: Secondary | ICD-10-CM | POA: Diagnosis not present

## 2023-09-14 DIAGNOSIS — Z23 Encounter for immunization: Secondary | ICD-10-CM | POA: Diagnosis not present

## 2023-09-14 DIAGNOSIS — G8929 Other chronic pain: Secondary | ICD-10-CM | POA: Diagnosis not present

## 2023-09-14 DIAGNOSIS — E1165 Type 2 diabetes mellitus with hyperglycemia: Secondary | ICD-10-CM | POA: Diagnosis not present

## 2023-09-14 DIAGNOSIS — E1169 Type 2 diabetes mellitus with other specified complication: Secondary | ICD-10-CM | POA: Diagnosis not present

## 2023-09-14 DIAGNOSIS — K588 Other irritable bowel syndrome: Secondary | ICD-10-CM | POA: Diagnosis not present

## 2023-09-14 MED ORDER — TRULANCE 3 MG PO TABS
3.0000 mg | ORAL_TABLET | Freq: Every day | ORAL | 2 refills | Status: DC
  Filled 2023-09-14: qty 30, 30d supply, fill #0

## 2023-09-14 MED ORDER — MOUNJARO 7.5 MG/0.5ML ~~LOC~~ SOAJ
7.5000 mg | SUBCUTANEOUS | 5 refills | Status: DC
  Filled 2023-09-14: qty 2, 28d supply, fill #0

## 2023-09-15 DIAGNOSIS — L821 Other seborrheic keratosis: Secondary | ICD-10-CM | POA: Diagnosis not present

## 2023-09-15 DIAGNOSIS — D225 Melanocytic nevi of trunk: Secondary | ICD-10-CM | POA: Diagnosis not present

## 2023-09-15 DIAGNOSIS — D1801 Hemangioma of skin and subcutaneous tissue: Secondary | ICD-10-CM | POA: Diagnosis not present

## 2023-09-15 DIAGNOSIS — D2272 Melanocytic nevi of left lower limb, including hip: Secondary | ICD-10-CM | POA: Diagnosis not present

## 2023-09-15 DIAGNOSIS — Z85820 Personal history of malignant melanoma of skin: Secondary | ICD-10-CM | POA: Diagnosis not present

## 2023-09-15 DIAGNOSIS — B070 Plantar wart: Secondary | ICD-10-CM | POA: Diagnosis not present

## 2023-09-18 ENCOUNTER — Encounter (INDEPENDENT_AMBULATORY_CARE_PROVIDER_SITE_OTHER): Payer: Self-pay | Admitting: *Deleted

## 2023-10-19 DIAGNOSIS — L4059 Other psoriatic arthropathy: Secondary | ICD-10-CM | POA: Diagnosis not present

## 2023-10-19 DIAGNOSIS — M5136 Other intervertebral disc degeneration, lumbar region with discogenic back pain only: Secondary | ICD-10-CM | POA: Diagnosis not present

## 2023-10-19 DIAGNOSIS — R252 Cramp and spasm: Secondary | ICD-10-CM | POA: Diagnosis not present

## 2023-10-19 DIAGNOSIS — N1831 Chronic kidney disease, stage 3a: Secondary | ICD-10-CM | POA: Diagnosis not present

## 2023-12-19 DIAGNOSIS — E1169 Type 2 diabetes mellitus with other specified complication: Secondary | ICD-10-CM | POA: Diagnosis not present

## 2023-12-19 DIAGNOSIS — E559 Vitamin D deficiency, unspecified: Secondary | ICD-10-CM | POA: Diagnosis not present

## 2023-12-19 DIAGNOSIS — E782 Mixed hyperlipidemia: Secondary | ICD-10-CM | POA: Diagnosis not present

## 2023-12-22 DIAGNOSIS — W19XXXA Unspecified fall, initial encounter: Secondary | ICD-10-CM | POA: Diagnosis not present

## 2023-12-22 DIAGNOSIS — G8929 Other chronic pain: Secondary | ICD-10-CM | POA: Diagnosis not present

## 2023-12-22 DIAGNOSIS — M545 Low back pain, unspecified: Secondary | ICD-10-CM | POA: Diagnosis not present

## 2023-12-25 ENCOUNTER — Other Ambulatory Visit (HOSPITAL_COMMUNITY): Payer: Self-pay | Admitting: Nurse Practitioner

## 2023-12-25 DIAGNOSIS — W19XXXA Unspecified fall, initial encounter: Secondary | ICD-10-CM

## 2023-12-26 ENCOUNTER — Other Ambulatory Visit (HOSPITAL_COMMUNITY): Payer: Self-pay

## 2023-12-26 DIAGNOSIS — N1831 Chronic kidney disease, stage 3a: Secondary | ICD-10-CM | POA: Diagnosis not present

## 2023-12-26 DIAGNOSIS — I129 Hypertensive chronic kidney disease with stage 1 through stage 4 chronic kidney disease, or unspecified chronic kidney disease: Secondary | ICD-10-CM | POA: Diagnosis not present

## 2023-12-26 DIAGNOSIS — E1122 Type 2 diabetes mellitus with diabetic chronic kidney disease: Secondary | ICD-10-CM | POA: Diagnosis not present

## 2023-12-26 DIAGNOSIS — M545 Low back pain, unspecified: Secondary | ICD-10-CM | POA: Diagnosis not present

## 2023-12-26 DIAGNOSIS — E1159 Type 2 diabetes mellitus with other circulatory complications: Secondary | ICD-10-CM | POA: Diagnosis not present

## 2023-12-26 DIAGNOSIS — W19XXXD Unspecified fall, subsequent encounter: Secondary | ICD-10-CM | POA: Diagnosis not present

## 2023-12-26 DIAGNOSIS — E1165 Type 2 diabetes mellitus with hyperglycemia: Secondary | ICD-10-CM | POA: Diagnosis not present

## 2023-12-26 DIAGNOSIS — G8929 Other chronic pain: Secondary | ICD-10-CM | POA: Diagnosis not present

## 2023-12-26 DIAGNOSIS — I10 Essential (primary) hypertension: Secondary | ICD-10-CM | POA: Diagnosis not present

## 2023-12-26 DIAGNOSIS — E782 Mixed hyperlipidemia: Secondary | ICD-10-CM | POA: Diagnosis not present

## 2023-12-26 DIAGNOSIS — E1169 Type 2 diabetes mellitus with other specified complication: Secondary | ICD-10-CM | POA: Diagnosis not present

## 2023-12-26 DIAGNOSIS — W19XXXA Unspecified fall, initial encounter: Secondary | ICD-10-CM | POA: Diagnosis not present

## 2023-12-26 MED ORDER — TRULANCE 3 MG PO TABS
3.0000 mg | ORAL_TABLET | Freq: Every day | ORAL | 2 refills | Status: DC
  Filled 2023-12-26 – 2024-01-09 (×3): qty 30, 30d supply, fill #0
  Filled 2024-01-23 – 2024-03-07 (×2): qty 30, 30d supply, fill #1
  Filled 2024-04-15: qty 30, 30d supply, fill #2

## 2023-12-26 MED ORDER — MOUNJARO 7.5 MG/0.5ML ~~LOC~~ SOAJ
7.5000 mg | SUBCUTANEOUS | 5 refills | Status: DC
  Filled 2023-12-26 – 2024-01-09 (×3): qty 2, 28d supply, fill #0
  Filled 2024-01-23 – 2024-02-13 (×2): qty 2, 28d supply, fill #1
  Filled 2024-05-22: qty 2, 28d supply, fill #2

## 2024-01-05 ENCOUNTER — Other Ambulatory Visit (HOSPITAL_COMMUNITY): Payer: Self-pay

## 2024-01-09 ENCOUNTER — Other Ambulatory Visit (HOSPITAL_COMMUNITY): Payer: Self-pay

## 2024-01-23 ENCOUNTER — Other Ambulatory Visit (HOSPITAL_COMMUNITY): Payer: Self-pay

## 2024-01-24 ENCOUNTER — Other Ambulatory Visit (HOSPITAL_COMMUNITY): Payer: Self-pay

## 2024-01-25 DIAGNOSIS — L4059 Other psoriatic arthropathy: Secondary | ICD-10-CM | POA: Diagnosis not present

## 2024-02-13 ENCOUNTER — Other Ambulatory Visit (HOSPITAL_COMMUNITY): Payer: Self-pay

## 2024-02-23 DIAGNOSIS — G8929 Other chronic pain: Secondary | ICD-10-CM | POA: Diagnosis not present

## 2024-02-23 DIAGNOSIS — I10 Essential (primary) hypertension: Secondary | ICD-10-CM | POA: Diagnosis not present

## 2024-02-23 DIAGNOSIS — M545 Low back pain, unspecified: Secondary | ICD-10-CM | POA: Diagnosis not present

## 2024-02-23 DIAGNOSIS — E669 Obesity, unspecified: Secondary | ICD-10-CM | POA: Diagnosis not present

## 2024-03-06 ENCOUNTER — Other Ambulatory Visit: Payer: Self-pay

## 2024-03-06 ENCOUNTER — Encounter (HOSPITAL_COMMUNITY): Payer: Self-pay | Admitting: Pharmacy Technician

## 2024-03-06 ENCOUNTER — Emergency Department (HOSPITAL_COMMUNITY): Payer: Worker's Compensation

## 2024-03-06 ENCOUNTER — Emergency Department (HOSPITAL_COMMUNITY)
Admission: EM | Admit: 2024-03-06 | Discharge: 2024-03-06 | Disposition: A | Discharge status: Home / Self Care | Payer: Worker's Compensation | Attending: Emergency Medicine | Admitting: Emergency Medicine

## 2024-03-06 DIAGNOSIS — Z794 Long term (current) use of insulin: Secondary | ICD-10-CM | POA: Insufficient documentation

## 2024-03-06 DIAGNOSIS — M25511 Pain in right shoulder: Secondary | ICD-10-CM | POA: Diagnosis present

## 2024-03-06 DIAGNOSIS — W010XXA Fall on same level from slipping, tripping and stumbling without subsequent striking against object, initial encounter: Secondary | ICD-10-CM | POA: Diagnosis not present

## 2024-03-06 DIAGNOSIS — S42294A Other nondisplaced fracture of upper end of right humerus, initial encounter for closed fracture: Secondary | ICD-10-CM | POA: Diagnosis not present

## 2024-03-06 DIAGNOSIS — W19XXXA Unspecified fall, initial encounter: Secondary | ICD-10-CM

## 2024-03-06 MED ORDER — DIAZEPAM 5 MG PO TABS: 5.0000 mg | ORAL_TABLET | Freq: Two times a day (BID) | ORAL | 0 refills | Status: DC | PRN

## 2024-03-06 MED ORDER — LORAZEPAM 2 MG/ML IJ SOLN
1.0000 mg | Freq: Once | INTRAMUSCULAR | Status: AC
Start: 2024-03-06 — End: 2024-03-06
  Administered 2024-03-06: 1 mg via INTRAVENOUS
  Filled 2024-03-06: qty 1

## 2024-03-06 MED ORDER — IBUPROFEN 600 MG PO TABS: 600.0000 mg | ORAL_TABLET | Freq: Four times a day (QID) | ORAL | 0 refills | Status: DC | PRN

## 2024-03-06 MED ORDER — FENTANYL CITRATE PF 50 MCG/ML IJ SOSY
50.0000 ug | PREFILLED_SYRINGE | Freq: Once | INTRAMUSCULAR | Status: AC
  Administered 2024-03-06: 50 ug via INTRAVENOUS
  Filled 2024-03-06: qty 1

## 2024-03-06 MED ORDER — OXYCODONE-ACETAMINOPHEN 5-325 MG PO TABS: 1.0000 | ORAL_TABLET | Freq: Four times a day (QID) | ORAL | 0 refills | Status: DC | PRN

## 2024-03-06 MED ORDER — FENTANYL CITRATE (PF) 100 MCG/2ML IJ SOLN
100.0000 ug | Freq: Once | INTRAMUSCULAR | Status: AC
  Administered 2024-03-06: 100 ug via INTRAVENOUS
  Filled 2024-03-06: qty 2

## 2024-03-06 MED ORDER — ONDANSETRON HCL 4 MG/2ML IJ SOLN
4.0000 mg | Freq: Once | INTRAMUSCULAR | Status: AC
  Administered 2024-03-06: 4 mg via INTRAVENOUS
  Filled 2024-03-06: qty 2

## 2024-03-06 NOTE — ED Provider Notes (Signed)
 [place] EMERGENCY DEPARTMENT AT [facility] Provider Note   CSN: [id number] Arrival date & time: 03/06/24  1020     History  Chief Complaint  Patient presents with   [name] is a 68 y.o. female.  Pt is a 68 yo female with pmhx significant for celiac disease, arthritis, fibromyalgia, and chronic pain.  Pt tripped over the threshold of a door and tried to use her right arm to stop her fall and landed on her right shoulder.  She can't move or lift up her arm.  She denies any other injuries.         Home Medications Prior to Admission medications   Medication Sig Start Date End Date Taking? Authorizing Provider  diazepam (VALIUM) 5 MG tablet Take 1 tablet (5 mg total) by mouth every 12 (twelve) hours as needed for muscle spasms. 03/06/24  Yes Jacalyn Lefevre, MD  ibuprofen (ADVIL) 600 MG tablet Take 1 tablet (600 mg total) by mouth every 6 (six) hours as needed. 03/06/24  Yes Jacalyn Lefevre, MD  oxyCODONE-acetaminophen (PERCOCET/ROXICET) 5-325 MG tablet Take 1 tablet by mouth every 6 (six) hours as needed for severe pain (pain score 7-10). 03/06/24  Yes Jacalyn Lefevre, MD  ALPRAZolam Prudy Feeler) 0.5 MG tablet Take 0.5 mg by mouth at bedtime as needed for sleep.    [provider]  ALPRAZolam Prudy Feeler) 0.5 MG tablet 1 tablet 08/26/20   [provider]  benzonatate (TESSALON) 200 MG capsule SMARTSIG:1 Capsule(s) By Mouth 08/10/21   [provider]  Buprenorphine 15 MCG/HR PTWK Place onto the skin.    [provider]  buPROPion (WELLBUTRIN XL) 300 MG 24 hr tablet Take 300 mg by mouth daily. 09/08/21   [provider]  Cholecalciferol (VITAMIN D3) 3000 UNITS TABS Take by mouth. Patient states hat she takes every other day    [provider]  diazepam (VALIUM) 5 MG tablet Take 1 tablet (5 mg total) by mouth every 8 (eight) hours as needed for muscle spasms. Patient not taking: Reported on 03/12/2015 11/26/14   Ward,  Layla Maw, DO  diclofenac (VOLTAREN) 75 MG EC tablet 1 tablet as needed 06/22/21   [provider]  DULoxetine (CYMBALTA) 30 MG capsule Take 60 mg by mouth daily.     [provider]  folic acid (FOLVITE) 1 MG tablet TAKE THREE TABLETS BY MOUTH ONCE A DAY. 07/22/20   [provider]  gabapentin (NEURONTIN) 300 MG capsule Take 300 mg by mouth 3 (three) times daily. 06/22/21   [provider]  ibuprofen (ADVIL,MOTRIN) 800 MG tablet Take 1 tablet (800 mg total) by mouth every 8 (eight) hours as needed for mild pain. Patient not taking: Reported on 03/12/2015 11/26/14   Ward, Layla Maw, DO  Insulin Degludec (TRESIBA) 100 UNIT/ML SOLN 16 units    [provider]  levocetirizine (XYZAL) 5 MG tablet Take 5 mg by mouth at bedtime. 03/11/15   [provider]  LINZESS 145 MCG CAPS capsule Take 145 mcg by mouth every 3 (three) days. 02/18/15   [provider]  magnesium 30 MG tablet Take 30 mg by mouth daily.    [provider]  metFORMIN (GLUCOPHAGE) 1000 MG tablet Take 1,000 mg by mouth 2 (two) times daily. 07/27/21   [provider]  metFORMIN (GLUCOPHAGE) 500 MG tablet Take 500 mg by mouth 2 (two) times daily. 02/14/15   [provider]  methotrexate 2.5 MG tablet Take 8  tablets by mouth once a week. 07/16/20   [provider]  ondansetron (ZOFRAN ODT) 4 MG disintegrating tablet Take 1 tablet (4 mg total) by mouth every 8 (eight) hours as needed for nausea or vomiting. Patient not taking: Reported on 03/12/2015 11/26/14   Ward, Layla Maw, DO  oxyCODONE-acetaminophen (PERCOCET/ROXICET) 5-325 MG per tablet Take 1 tablet by mouth every 6 (six) hours as needed. Patient not taking: Reported on 03/12/2015 11/26/14   Ward, Layla Maw, DO  Plecanatide (TRULANCE) 3 MG TABS Take 1 tablet (3 mg total) by mouth daily. 09/14/23     Plecanatide (TRULANCE) 3 MG TABS Take 1 tablet (3 mg total) by mouth daily. 12/26/23     potassium chloride SA  (K-DUR,KLOR-CON) 20 MEQ tablet Take 20 mEq by mouth daily.    [provider]  predniSONE (STERAPRED UNI-PAK 21 TAB) 10 MG (21) TBPK tablet Take by mouth. 08/20/21   [provider]  pyridOXINE (VITAMIN B-6) 100 MG tablet Take 100 mg by mouth daily.    [provider]  QUEtiapine (SEROQUEL) 100 MG tablet Take 100 mg by mouth at bedtime.    [provider]  thiamine (VITAMIN B-1) 100 MG tablet Take 100 mg by mouth daily.    [provider]  tirzepatide Greggory Keen) 7.5 MG/0.5ML Pen Inject 7.5 mg into the skin once a week. 09/14/23     tirzepatide (MOUNJARO) 7.5 MG/0.5ML Pen Inject 7.5 mg into the skin once a week as directed 12/26/23     tiZANidine (ZANAFLEX) 4 MG capsule Take 4 mg by mouth at bedtime.    [provider]  traMADol (ULTRAM) 50 MG tablet Take 50 mg by mouth every 6 (six) hours as needed for moderate pain or severe pain.    [provider]  triamterene-hydrochlorothiazide (MAXZIDE) 75-50 MG per tablet Take 0.5 tablets by mouth daily.     [provider]  vitamin B-12 (CYANOCOBALAMIN) 100 MCG tablet Take 100 mcg by mouth daily.    [provider]      Allergies    Cefazolin, Gluten meal, Cinnamon, Codeine, Meperidine hcl, Milk protein, Morphine, Penicillamine, Penicillins, Sulfonamide derivatives, and Sulfur    Review of Systems   Review of Systems  Musculoskeletal:        Right shoulder pain  All other systems reviewed and are negative.   Physical Exam Updated Vital Signs BP (!) 163/88   Pulse (!) 104   Temp 97.7 F (36.5 C)   SpO2 95%  Physical Exam Vitals and nursing note reviewed.  Constitutional:      Appearance: Normal appearance.  HENT:     Head: Normocephalic and atraumatic.     Right Ear: External ear normal.     Left Ear: External ear normal.     Nose: Nose normal.     Mouth/Throat:     Mouth: Mucous membranes are moist.     Pharynx: Oropharynx is clear.  Eyes:     Extraocular  Movements: Extraocular movements intact.     Conjunctiva/sclera: Conjunctivae normal.     Pupils: Pupils are equal, round, and reactive to light.  Cardiovascular:     Rate and Rhythm: Normal rate and regular rhythm.     Pulses: Normal pulses.     Heart sounds: Normal heart sounds.  Pulmonary:     Effort: Pulmonary effort is normal.     Breath sounds: Normal breath sounds.  Abdominal:     General: Abdomen is flat. Bowel sounds are normal.  Palpations: Abdomen is soft.  Musculoskeletal:       Arms:     Cervical back: Normal range of motion and neck supple.  Skin:    General: Skin is warm.     Capillary Refill: Capillary refill takes less than 2 seconds.  Neurological:     General: No focal deficit present.     Mental Status: She is alert and oriented to person, place, and time.  Psychiatric:        Mood and Affect: Mood normal.        Behavior: Behavior normal.     ED Results / Procedures / Treatments   Labs (all labs ordered are listed, but only abnormal results are displayed) Labs Reviewed - No data to display  EKG None  Radiology DG Shoulder Right Result Date: 03/06/2024 CLINICAL DATA:  Right shoulder pain after fall. EXAM: RIGHT SHOULDER - 2+ VIEW COMPARISON:  None Available. FINDINGS: Acute fracture of the right proximal humerus involving the surgical neck and greater tuberosity with mild impaction and minimal displacement. No dislocation. Degenerative changes of the acromioclavicular joint. Soft tissues are unremarkable. IMPRESSION: 1. Acute fracture of the right proximal humerus. Electronically Signed   By: Obie Dredge M.D.   On: 03/06/2024 13:56    Procedures Procedures    Medications Ordered in ED Medications  ondansetron (ZOFRAN) injection 4 mg (4 mg Intravenous Given 03/06/24 1109)  fentaNYL (SUBLIMAZE) injection 50 mcg (50 mcg Intravenous Given 03/06/24 1108)  LORazepam (ATIVAN) injection 1 mg (1 mg Intravenous Given 03/06/24 1109)  fentaNYL  (SUBLIMAZE) injection 100 mcg (100 mcg Intravenous Given 03/06/24 1311)  fentaNYL (SUBLIMAZE) injection 50 mcg (50 mcg Intravenous Given 03/06/24 1423)    ED Course/ Medical Decision Making/ A&P                                 Medical Decision Making Amount and/or Complexity of Data Reviewed Radiology: ordered.  Risk Prescription drug management.   This patient presents to the ED for concern of right shoulder pain, this involves an extensive number of treatment options, and is a complaint that carries with it a high risk of complications and morbidity.  The differential diagnosis includes fx, d/l, strain, contusion   Co morbidities that complicate the patient evaluation  celiac disease, arthritis, fibromyalgia, and chronic pain   Additional history obtained:  Additional history obtained from epic chart review External records from outside source obtained and reviewed including EMS report/friend  Imaging Studies ordered:  I ordered imaging studies including r shoulder  I independently visualized and interpreted imaging which showed Acute fracture of the right proximal humerus.  I agree with the radiologist interpretation   Medicines ordered and prescription drug management:  I ordered medication including fentanyl  for sx  Reevaluation of the patient after these medicines showed that the patient improved I have reviewed the patients home medicines and have made adjustments as needed   Problem List / ED Course:  Proximal humerus fx:  pt placed in a sling.  She is to f/u with ortho.  She is to return if worse.    Reevaluation:  After the interventions noted above, I reevaluated the patient and found that they have :improved   Social Determinants of Health:  Lives at home   Dispostion:  After consideration of the diagnostic results and the patients response to treatment, I feel that the patent would benefit from discharge with outpatient f/u.  Final Clinical Impression(s) / ED Diagnoses Final diagnoses:  Fall, initial encounter  Other closed nondisplaced fracture of proximal end of right humerus, initial encounter    Rx / DC Orders ED Discharge Orders          Ordered    oxyCODONE-acetaminophen (PERCOCET/ROXICET) 5-325 MG tablet  Every 6 hours PRN        03/06/24 1412    ibuprofen (ADVIL) 600 MG tablet  Every 6 hours PRN        03/06/24 1412    diazepam (VALIUM) 5 MG tablet  Every 12 hours PRN        03/06/24 1412              Jacalyn Lefevre, MD 03/06/24 1429

## 2024-03-06 NOTE — ED Triage Notes (Signed)
 Pt bib ems from work where pt had a mechanical fall. Attempted to use her R hand to catch her fall. Pt now with R shoulder pain.

## 2024-03-07 ENCOUNTER — Other Ambulatory Visit (HOSPITAL_COMMUNITY): Payer: Self-pay

## 2024-03-07 MED ORDER — HYDROMORPHONE HCL 2 MG PO TABS
2.0000 mg | ORAL_TABLET | ORAL | 0 refills | Status: DC
  Filled 2024-03-07: qty 60, 10d supply, fill #0

## 2024-03-14 DIAGNOSIS — M25511 Pain in right shoulder: Secondary | ICD-10-CM | POA: Diagnosis not present

## 2024-03-14 DIAGNOSIS — M25531 Pain in right wrist: Secondary | ICD-10-CM | POA: Diagnosis not present

## 2024-03-14 DIAGNOSIS — S42201A Unspecified fracture of upper end of right humerus, initial encounter for closed fracture: Secondary | ICD-10-CM | POA: Diagnosis not present

## 2024-03-14 DIAGNOSIS — S42251A Displaced fracture of greater tuberosity of right humerus, initial encounter for closed fracture: Secondary | ICD-10-CM | POA: Diagnosis not present

## 2024-03-18 ENCOUNTER — Encounter (INDEPENDENT_AMBULATORY_CARE_PROVIDER_SITE_OTHER): Payer: Self-pay | Admitting: *Deleted

## 2024-03-20 ENCOUNTER — Telehealth: Payer: Self-pay

## 2024-03-20 NOTE — Progress Notes (Signed)
   03/20/2024  Patient ID: [name], female   DOB: May 30, 1956, 68 y.o.   MRN: [id number]   HTA CSNP 90DS Conversion  Spoke with Jan today about her Metformin and Mounjaro. She broke her arm two weeks ago and hasn't been taking meds as consistently. Due for both but does not want to refill the [facility] yet since she still has 3 pens left and says she isn't eating much at the moment. Did agree to refill Metformin which she gets at [facility]. Will send in 90DS to pharmacy and review fill history in 1 week to confirm fill.    Fayette Pho, PharmD

## 2024-03-27 ENCOUNTER — Telehealth: Payer: Self-pay

## 2024-03-27 NOTE — Progress Notes (Signed)
   03/27/2024  Patient ID: [name], female   DOB: 11-10-1956, 68 y.o.   MRN: [id number]   HTA CSNP 90DS Conversion  Confirmed fill of Metformin 1000 mg for 90DS at [facility] on 03/20/24, next fill due 06/18/24. She is aware of refills for Mounjaro 7.5 mg with [facility] but still has some pens remaining and is not willing to go pick up additional refills at this time. Should have about 3 weeks remaining. Will follow up at the end of the month to confirm next fill.   Fayette Pho, PharmD

## 2024-04-15 ENCOUNTER — Other Ambulatory Visit (HOSPITAL_COMMUNITY): Payer: Self-pay

## 2024-04-17 ENCOUNTER — Telehealth: Payer: Self-pay

## 2024-04-17 NOTE — Progress Notes (Signed)
   04/17/2024  Patient ID: [name], female   DOB: 1956/03/20, 68 y.o.   MRN: [id number]   Adherence Monitoring  -Confirmed fill of Metformin 1000 mg for 90DS at [facility] on 03/20/24, next fill due 06/18/24.   -Has not been taking Mounjaro  7.5 mg, fills this with [facility]. She still has a full box of Mounjaro  at home. Does not think she needs to take it because she hasn't been eating much and doesn't think she needs additional weight loss. Counseled her on benefits of blood sugar control and encouraged her to restart Mounjaro  to benefit her blood sugar. She says her blood sugar is doing fine and does not want to restart Mounjaro  at this time.  Plan:  -Strongly encouraged her to restart Mounjaro , follow up in June will determine glucose control without Mounjaro  if she does not restart this. I will continue to follow metformin adherence.  Flint Hummer, PharmD

## 2024-05-17 DIAGNOSIS — E1169 Type 2 diabetes mellitus with other specified complication: Secondary | ICD-10-CM | POA: Diagnosis not present

## 2024-05-17 DIAGNOSIS — E782 Mixed hyperlipidemia: Secondary | ICD-10-CM | POA: Diagnosis not present

## 2024-05-17 DIAGNOSIS — E559 Vitamin D deficiency, unspecified: Secondary | ICD-10-CM | POA: Diagnosis not present

## 2024-05-22 ENCOUNTER — Other Ambulatory Visit (HOSPITAL_COMMUNITY): Payer: Self-pay

## 2024-05-24 ENCOUNTER — Other Ambulatory Visit (HOSPITAL_COMMUNITY): Payer: Self-pay

## 2024-05-24 DIAGNOSIS — E782 Mixed hyperlipidemia: Secondary | ICD-10-CM | POA: Diagnosis not present

## 2024-05-24 DIAGNOSIS — S42201D Unspecified fracture of upper end of right humerus, subsequent encounter for fracture with routine healing: Secondary | ICD-10-CM | POA: Diagnosis not present

## 2024-05-24 DIAGNOSIS — E559 Vitamin D deficiency, unspecified: Secondary | ICD-10-CM | POA: Diagnosis not present

## 2024-05-24 DIAGNOSIS — I10 Essential (primary) hypertension: Secondary | ICD-10-CM | POA: Diagnosis not present

## 2024-05-24 DIAGNOSIS — L4050 Arthropathic psoriasis, unspecified: Secondary | ICD-10-CM | POA: Diagnosis not present

## 2024-05-24 DIAGNOSIS — E1122 Type 2 diabetes mellitus with diabetic chronic kidney disease: Secondary | ICD-10-CM | POA: Diagnosis not present

## 2024-05-24 DIAGNOSIS — W19XXXD Unspecified fall, subsequent encounter: Secondary | ICD-10-CM | POA: Diagnosis not present

## 2024-05-24 DIAGNOSIS — K581 Irritable bowel syndrome with constipation: Secondary | ICD-10-CM | POA: Diagnosis not present

## 2024-05-24 DIAGNOSIS — E1169 Type 2 diabetes mellitus with other specified complication: Secondary | ICD-10-CM | POA: Diagnosis not present

## 2024-05-24 DIAGNOSIS — G720 Drug-induced myopathy: Secondary | ICD-10-CM | POA: Diagnosis not present

## 2024-05-24 DIAGNOSIS — E1159 Type 2 diabetes mellitus with other circulatory complications: Secondary | ICD-10-CM | POA: Diagnosis not present

## 2024-05-24 DIAGNOSIS — N1831 Chronic kidney disease, stage 3a: Secondary | ICD-10-CM | POA: Diagnosis not present

## 2024-05-24 MED ORDER — TRULANCE 3 MG PO TABS
3.0000 mg | ORAL_TABLET | Freq: Every day | ORAL | 2 refills | Status: DC
  Filled 2024-05-24 – 2024-08-08 (×2): qty 30, 30d supply, fill #0
  Filled 2024-09-06: qty 30, 30d supply, fill #1
  Filled 2024-10-09: qty 30, 30d supply, fill #2

## 2024-06-18 DIAGNOSIS — Z6822 Body mass index (BMI) 22.0-22.9, adult: Secondary | ICD-10-CM | POA: Diagnosis not present

## 2024-06-18 DIAGNOSIS — R252 Cramp and spasm: Secondary | ICD-10-CM | POA: Diagnosis not present

## 2024-06-18 DIAGNOSIS — L4059 Other psoriatic arthropathy: Secondary | ICD-10-CM | POA: Diagnosis not present

## 2024-06-18 DIAGNOSIS — S42291D Other displaced fracture of upper end of right humerus, subsequent encounter for fracture with routine healing: Secondary | ICD-10-CM | POA: Diagnosis not present

## 2024-06-18 DIAGNOSIS — N1831 Chronic kidney disease, stage 3a: Secondary | ICD-10-CM | POA: Diagnosis not present

## 2024-06-18 DIAGNOSIS — M5136 Other intervertebral disc degeneration, lumbar region with discogenic back pain only: Secondary | ICD-10-CM | POA: Diagnosis not present

## 2024-06-24 ENCOUNTER — Telehealth: Payer: Self-pay

## 2024-06-24 NOTE — Telephone Encounter (Signed)
 Overdue on metformin and Mounjaro , unsuccessful outreach, will try again later this week

## 2024-06-27 DIAGNOSIS — E119 Type 2 diabetes mellitus without complications: Secondary | ICD-10-CM | POA: Diagnosis not present

## 2024-07-02 ENCOUNTER — Other Ambulatory Visit (HOSPITAL_COMMUNITY): Payer: Self-pay

## 2024-07-02 ENCOUNTER — Other Ambulatory Visit: Payer: Self-pay

## 2024-07-02 MED ORDER — MOUNJARO 10 MG/0.5ML ~~LOC~~ SOAJ
10.0000 mg | SUBCUTANEOUS | 5 refills | Status: DC
  Filled 2024-07-02: qty 2, 28d supply, fill #0

## 2024-07-03 ENCOUNTER — Telehealth: Payer: Self-pay

## 2024-07-03 NOTE — Telephone Encounter (Signed)
 Outreach successful, provided reminder for refills on metformin and Mounjaro . Increased Mounjaro  dose sent in by PCP. Will confirm fills next week

## 2024-07-18 ENCOUNTER — Other Ambulatory Visit (HOSPITAL_COMMUNITY): Payer: Self-pay

## 2024-07-18 MED ORDER — MOUNJARO 10 MG/0.5ML ~~LOC~~ SOAJ
10.0000 mg | SUBCUTANEOUS | 5 refills | Status: AC
  Filled 2024-07-30: qty 2, 28d supply, fill #0
  Filled 2024-09-06: qty 2, 28d supply, fill #1
  Filled 2024-10-09: qty 2, 28d supply, fill #2
  Filled 2024-11-08: qty 2, 28d supply, fill #3
  Filled 2024-12-09: qty 2, 28d supply, fill #4
  Filled 2024-12-31: qty 2, 28d supply, fill #5

## 2024-07-24 ENCOUNTER — Other Ambulatory Visit (HOSPITAL_COMMUNITY): Payer: Self-pay

## 2024-07-24 ENCOUNTER — Other Ambulatory Visit (HOSPITAL_COMMUNITY): Payer: Self-pay | Admitting: Internal Medicine

## 2024-07-24 DIAGNOSIS — M545 Low back pain, unspecified: Secondary | ICD-10-CM

## 2024-07-24 DIAGNOSIS — Z6825 Body mass index (BMI) 25.0-25.9, adult: Secondary | ICD-10-CM | POA: Diagnosis not present

## 2024-07-24 DIAGNOSIS — G8929 Other chronic pain: Secondary | ICD-10-CM | POA: Diagnosis not present

## 2024-07-24 DIAGNOSIS — E663 Overweight: Secondary | ICD-10-CM | POA: Diagnosis not present

## 2024-07-24 DIAGNOSIS — Z713 Dietary counseling and surveillance: Secondary | ICD-10-CM | POA: Diagnosis not present

## 2024-07-24 DIAGNOSIS — Z7182 Exercise counseling: Secondary | ICD-10-CM | POA: Diagnosis not present

## 2024-07-24 DIAGNOSIS — S42201A Unspecified fracture of upper end of right humerus, initial encounter for closed fracture: Secondary | ICD-10-CM | POA: Diagnosis not present

## 2024-07-24 DIAGNOSIS — L4050 Arthropathic psoriasis, unspecified: Secondary | ICD-10-CM | POA: Diagnosis not present

## 2024-07-24 DIAGNOSIS — I10 Essential (primary) hypertension: Secondary | ICD-10-CM | POA: Diagnosis not present

## 2024-07-30 ENCOUNTER — Other Ambulatory Visit (HOSPITAL_COMMUNITY): Payer: Self-pay

## 2024-07-31 DIAGNOSIS — K1370 Unspecified lesions of oral mucosa: Secondary | ICD-10-CM | POA: Diagnosis not present

## 2024-08-02 ENCOUNTER — Encounter (HOSPITAL_COMMUNITY): Payer: Self-pay

## 2024-08-02 ENCOUNTER — Ambulatory Visit (HOSPITAL_COMMUNITY)

## 2024-08-03 ENCOUNTER — Emergency Department (HOSPITAL_COMMUNITY)

## 2024-08-03 ENCOUNTER — Inpatient Hospital Stay (HOSPITAL_COMMUNITY)
Admission: EM | Admit: 2024-08-03 | Discharge: 2024-08-10 | DRG: 808 | Disposition: A | Discharge status: Home / Self Care | Attending: Internal Medicine | Admitting: Internal Medicine

## 2024-08-03 ENCOUNTER — Other Ambulatory Visit: Payer: Self-pay

## 2024-08-03 ENCOUNTER — Encounter (HOSPITAL_COMMUNITY): Payer: Self-pay | Admitting: Emergency Medicine

## 2024-08-03 DIAGNOSIS — E785 Hyperlipidemia, unspecified: Secondary | ICD-10-CM | POA: Diagnosis not present

## 2024-08-03 DIAGNOSIS — A419 Sepsis, unspecified organism: Secondary | ICD-10-CM | POA: Diagnosis present

## 2024-08-03 DIAGNOSIS — K1230 Oral mucositis (ulcerative), unspecified: Principal | ICD-10-CM | POA: Diagnosis present

## 2024-08-03 DIAGNOSIS — Z7985 Long-term (current) use of injectable non-insulin antidiabetic drugs: Secondary | ICD-10-CM

## 2024-08-03 DIAGNOSIS — Z1152 Encounter for screening for COVID-19: Secondary | ICD-10-CM

## 2024-08-03 DIAGNOSIS — J90 Pleural effusion, not elsewhere classified: Secondary | ICD-10-CM | POA: Diagnosis not present

## 2024-08-03 DIAGNOSIS — N179 Acute kidney failure, unspecified: Secondary | ICD-10-CM | POA: Diagnosis not present

## 2024-08-03 DIAGNOSIS — I509 Heart failure, unspecified: Secondary | ICD-10-CM | POA: Diagnosis not present

## 2024-08-03 DIAGNOSIS — F419 Anxiety disorder, unspecified: Secondary | ICD-10-CM | POA: Diagnosis present

## 2024-08-03 DIAGNOSIS — K121 Other forms of stomatitis: Secondary | ICD-10-CM | POA: Diagnosis present

## 2024-08-03 DIAGNOSIS — F32A Depression, unspecified: Secondary | ICD-10-CM | POA: Diagnosis present

## 2024-08-03 DIAGNOSIS — R531 Weakness: Secondary | ICD-10-CM | POA: Diagnosis not present

## 2024-08-03 DIAGNOSIS — R509 Fever, unspecified: Secondary | ICD-10-CM

## 2024-08-03 DIAGNOSIS — B2700 Gammaherpesviral mononucleosis without complication: Secondary | ICD-10-CM | POA: Diagnosis present

## 2024-08-03 DIAGNOSIS — Z79899 Other long term (current) drug therapy: Secondary | ICD-10-CM

## 2024-08-03 DIAGNOSIS — E860 Dehydration: Secondary | ICD-10-CM | POA: Diagnosis not present

## 2024-08-03 DIAGNOSIS — W19XXXA Unspecified fall, initial encounter: Secondary | ICD-10-CM | POA: Diagnosis not present

## 2024-08-03 DIAGNOSIS — M797 Fibromyalgia: Secondary | ICD-10-CM | POA: Diagnosis present

## 2024-08-03 DIAGNOSIS — Z7984 Long term (current) use of oral hypoglycemic drugs: Secondary | ICD-10-CM

## 2024-08-03 DIAGNOSIS — R0602 Shortness of breath: Secondary | ICD-10-CM | POA: Diagnosis not present

## 2024-08-03 DIAGNOSIS — I2510 Atherosclerotic heart disease of native coronary artery without angina pectoris: Secondary | ICD-10-CM | POA: Diagnosis not present

## 2024-08-03 DIAGNOSIS — Z8249 Family history of ischemic heart disease and other diseases of the circulatory system: Secondary | ICD-10-CM

## 2024-08-03 DIAGNOSIS — Y92008 Other place in unspecified non-institutional (private) residence as the place of occurrence of the external cause: Secondary | ICD-10-CM

## 2024-08-03 DIAGNOSIS — I5031 Acute diastolic (congestive) heart failure: Secondary | ICD-10-CM | POA: Diagnosis not present

## 2024-08-03 DIAGNOSIS — E871 Hypo-osmolality and hyponatremia: Secondary | ICD-10-CM | POA: Diagnosis not present

## 2024-08-03 DIAGNOSIS — L4059 Other psoriatic arthropathy: Secondary | ICD-10-CM | POA: Diagnosis not present

## 2024-08-03 DIAGNOSIS — J9811 Atelectasis: Secondary | ICD-10-CM | POA: Diagnosis not present

## 2024-08-03 DIAGNOSIS — W07XXXA Fall from chair, initial encounter: Secondary | ICD-10-CM | POA: Diagnosis present

## 2024-08-03 DIAGNOSIS — E1142 Type 2 diabetes mellitus with diabetic polyneuropathy: Secondary | ICD-10-CM | POA: Diagnosis present

## 2024-08-03 DIAGNOSIS — R000 Tachycardia, unspecified: Secondary | ICD-10-CM | POA: Diagnosis not present

## 2024-08-03 DIAGNOSIS — E876 Hypokalemia: Secondary | ICD-10-CM | POA: Diagnosis not present

## 2024-08-03 DIAGNOSIS — E861 Hypovolemia: Secondary | ICD-10-CM | POA: Diagnosis present

## 2024-08-03 DIAGNOSIS — Z043 Encounter for examination and observation following other accident: Secondary | ICD-10-CM | POA: Diagnosis not present

## 2024-08-03 DIAGNOSIS — Z87891 Personal history of nicotine dependence: Secondary | ICD-10-CM

## 2024-08-03 DIAGNOSIS — D61818 Other pancytopenia: Principal | ICD-10-CM | POA: Diagnosis present

## 2024-08-03 DIAGNOSIS — R7401 Elevation of levels of liver transaminase levels: Secondary | ICD-10-CM | POA: Diagnosis present

## 2024-08-03 DIAGNOSIS — I700 Atherosclerosis of aorta: Secondary | ICD-10-CM | POA: Diagnosis not present

## 2024-08-03 DIAGNOSIS — L4050 Arthropathic psoriasis, unspecified: Secondary | ICD-10-CM | POA: Diagnosis present

## 2024-08-03 DIAGNOSIS — M4802 Spinal stenosis, cervical region: Secondary | ICD-10-CM | POA: Diagnosis not present

## 2024-08-03 DIAGNOSIS — K120 Recurrent oral aphthae: Secondary | ICD-10-CM | POA: Diagnosis present

## 2024-08-03 LAB — COMPREHENSIVE METABOLIC PANEL WITH GFR
ALT: 27 U/L (ref 0–44)
AST: 65 U/L — ABNORMAL HIGH (ref 15–41)
Albumin: 3.3 g/dL — ABNORMAL LOW (ref 3.5–5.0)
Alkaline Phosphatase: 46 U/L (ref 38–126)
Anion gap: 12 (ref 5–15)
BUN: 34 mg/dL — ABNORMAL HIGH (ref 8–23)
CO2: 25 mmol/L (ref 22–32)
Calcium: 8.7 mg/dL — ABNORMAL LOW (ref 8.9–10.3)
Chloride: 95 mmol/L — ABNORMAL LOW (ref 98–111)
Creatinine, Ser: 1.19 mg/dL — ABNORMAL HIGH (ref 0.44–1.00)
GFR, Estimated: 49 mL/min — ABNORMAL LOW (ref >60)
Glucose, Bld: 150 mg/dL — ABNORMAL HIGH (ref 70–99)
Potassium: 2.8 mmol/L — ABNORMAL LOW (ref 3.5–5.1)
Sodium: 132 mmol/L — ABNORMAL LOW (ref 135–145)
Total Bilirubin: 0.6 mg/dL (ref 0.0–1.2)
Total Protein: 7 g/dL (ref 6.5–8.1)

## 2024-08-03 LAB — CBC WITH DIFFERENTIAL/PLATELET
Abs Immature Granulocytes: 0.03 K/uL (ref 0.00–0.07)
Basophils Absolute: 0 K/uL (ref 0.0–0.1)
Basophils Relative: 0 %
Eosinophils Absolute: 0 K/uL (ref 0.0–0.5)
Eosinophils Relative: 0 %
HCT: 30.2 % — ABNORMAL LOW (ref 36.0–46.0)
Hemoglobin: 10.3 g/dL — ABNORMAL LOW (ref 12.0–15.0)
Immature Granulocytes: 1 %
Lymphocytes Relative: 12 %
Lymphs Abs: 0.3 K/uL — ABNORMAL LOW (ref 0.7–4.0)
MCH: 32.6 pg (ref 26.0–34.0)
MCHC: 34.1 g/dL (ref 30.0–36.0)
MCV: 95.6 fL (ref 80.0–100.0)
Monocytes Absolute: 0.1 K/uL (ref 0.1–1.0)
Monocytes Relative: 3 %
Neutro Abs: 1.9 K/uL (ref 1.7–7.7)
Neutrophils Relative %: 84 %
Platelets: 53 K/uL — ABNORMAL LOW (ref 150–400)
RBC: 3.16 MIL/uL — ABNORMAL LOW (ref 3.87–5.11)
RDW: 13.3 % (ref 11.5–15.5)
WBC: 2.3 K/uL — ABNORMAL LOW (ref 4.0–10.5)
nRBC: 0 % (ref 0.0–0.2)

## 2024-08-03 LAB — RESP PANEL BY RT-PCR (RSV, FLU A&B, COVID)  RVPGX2
Influenza A by PCR: NEGATIVE
Influenza B by PCR: NEGATIVE
Resp Syncytial Virus by PCR: NEGATIVE
SARS Coronavirus 2 by RT PCR: NEGATIVE

## 2024-08-03 LAB — LACTIC ACID, PLASMA: Lactic Acid, Venous: 1.5 mmol/L (ref 0.5–1.9)

## 2024-08-03 LAB — GROUP A STREP BY PCR: Group A Strep by PCR: NOT DETECTED

## 2024-08-03 MED ORDER — CLINDAMYCIN PHOSPHATE 600 MG/50ML IV SOLN: 600.0000 mg | Freq: Once | INTRAVENOUS | Status: DC

## 2024-08-03 MED ORDER — SODIUM CHLORIDE 0.9 % IV SOLN
500.0000 mg | Freq: Once | INTRAVENOUS | Status: AC
  Administered 2024-08-03: 500 mg via INTRAVENOUS
  Filled 2024-08-03: qty 5

## 2024-08-03 MED ORDER — ACETAMINOPHEN 325 MG PO TABS
650.0000 mg | ORAL_TABLET | Freq: Once | ORAL | Status: AC
  Administered 2024-08-03: 650 mg via ORAL
  Filled 2024-08-03: qty 2

## 2024-08-03 MED ORDER — SODIUM CHLORIDE 0.9 % IV BOLUS
500.0000 mL | Freq: Once | INTRAVENOUS | Status: AC
  Administered 2024-08-03: 500 mL via INTRAVENOUS

## 2024-08-03 MED ORDER — POTASSIUM CHLORIDE 10 MEQ/100ML IV SOLN: 10.0000 meq | Freq: Once | INTRAVENOUS | Status: DC

## 2024-08-03 MED ORDER — IOHEXOL 300 MG/ML  SOLN
75.0000 mL | Freq: Once | INTRAMUSCULAR | Status: AC | PRN
  Administered 2024-08-03: 75 mL via INTRAVENOUS

## 2024-08-03 MED ORDER — SODIUM CHLORIDE 0.9 % IV BOLUS
1000.0000 mL | Freq: Once | INTRAVENOUS | Status: AC
  Administered 2024-08-03: 1000 mL via INTRAVENOUS

## 2024-08-03 NOTE — ED Triage Notes (Signed)
 Pt to the ED [facility] after a fall where she slid off the fron of the sofa. Pt has a previous rt shoulder injury.  The patient cx of pain in her Rt hip aftfer this fall.  Pt has a current mouth infection CBG was 186 and EMS reports negative stroke screen.

## 2024-08-03 NOTE — ED Provider Notes (Signed)
 [place] EMERGENCY DEPARTMENT AT [facility] Provider Note   CSN: [id number] Arrival date & time: 08/03/24  2056     Patient presents with: Fall   [name] is a 68 y.o. female.   Patient comes in complaining of a severe sore throat that she has been treated with antibiotics but has not improved.  She has a history of psoriatic arthritis and takes methotrexate  also she has diabetes and is on metformin.  Patient states she is getting weaker and weaker and she slipped off the couch.  Patient has no pain from the fall.  She has broken her shoulder before and has been scheduled discomfort from that  The history is provided by the patient.  Weakness Severity:  Moderate Onset quality:  Gradual Timing:  Constant Progression:  Worsening Chronicity:  Recurrent Context: not alcohol use   Relieved by:  Nothing Worsened by:  Nothing Ineffective treatments:  None tried Associated symptoms: no abdominal pain, no chest pain, no cough, no diarrhea, no frequency, no headaches and no seizures        Prior to Admission medications   Medication Sig Start Date End Date Taking? Authorizing Provider  ALPRAZolam  (XANAX ) 0.5 MG tablet Take 0.5 mg by mouth at bedtime as needed for sleep.    [provider]  ALPRAZolam  (XANAX ) 0.5 MG tablet 1 tablet 08/26/20   [provider]  benzonatate  (TESSALON ) 200 MG capsule SMARTSIG:1 Capsule(s) By Mouth 08/10/21   [provider]  Buprenorphine 15 MCG/HR PTWK Place onto the skin.    [provider]  buPROPion  (WELLBUTRIN  XL) 300 MG 24 hr tablet Take 300 mg by mouth daily. 09/08/21   [provider]  Cholecalciferol (VITAMIN D3) 3000 UNITS TABS Take by mouth. Patient states hat she takes every other day    [provider]  diazepam  (VALIUM ) 5 MG tablet Take 1 tablet (5 mg total) by mouth every 8 (eight) hours as needed for muscle spasms. Patient not taking: Reported on 03/12/2015 11/26/14   Ward,  Josette SAILOR, DO  diazepam  (VALIUM ) 5 MG tablet Take 1 tablet (5 mg total) by mouth every 12 (twelve) hours as needed for muscle spasms. 03/06/24   Haviland, Julie, MD  diclofenac (VOLTAREN) 75 MG EC tablet 1 tablet as needed 06/22/21   [provider]  DULoxetine  (CYMBALTA ) 30 MG capsule Take 60 mg by mouth daily.     [provider]  folic acid  (FOLVITE ) 1 MG tablet TAKE THREE TABLETS BY MOUTH ONCE A DAY. 07/22/20   [provider]  gabapentin  (NEURONTIN ) 300 MG capsule Take 300 mg by mouth 3 (three) times daily. 06/22/21   [provider]  HYDROmorphone  (DILAUDID ) 2 MG tablet Take 1 tablet (2 mg total) by mouth every 4 (four) hours. 03/07/24     ibuprofen  (ADVIL ) 600 MG tablet Take 1 tablet (600 mg total) by mouth every 6 (six) hours as needed. 03/06/24   Dean Clarity, MD  ibuprofen  (ADVIL ,MOTRIN ) 800 MG tablet Take 1 tablet (800 mg total) by mouth every 8 (eight) hours as needed for mild pain. Patient not taking: Reported on 03/12/2015 11/26/14   Ward, Josette SAILOR, DO  Insulin  Degludec (TRESIBA ) 100 UNIT/ML SOLN 16 units    [provider]  levocetirizine (XYZAL ) 5 MG tablet Take 5 mg by mouth at bedtime. 03/11/15   [provider]  LINZESS  145 MCG CAPS capsule Take 145 mcg by mouth every 3 (three) days. 02/18/15   [provider]  magnesium  30 MG tablet Take 30 mg by mouth daily.    [provider]  metFORMIN (GLUCOPHAGE) 1000 MG tablet Take 1,000 mg by mouth 2 (two) times daily. 07/27/21   [provider]  metFORMIN (GLUCOPHAGE) 500 MG tablet Take 500 mg by mouth 2 (two) times daily. 02/14/15   [provider]  methotrexate  2.5 MG tablet Take 8 tablets by mouth once a week. 07/16/20   [provider]  ondansetron  (ZOFRAN  ODT) 4 MG disintegrating tablet Take 1 tablet (4 mg total) by mouth every 8 (eight) hours as needed for nausea or vomiting. Patient not taking: Reported on 03/12/2015 11/26/14   Ward, Josette SAILOR, DO   oxyCODONE -acetaminophen  (PERCOCET/ROXICET) 5-325 MG per tablet Take 1 tablet by mouth every 6 (six) hours as needed. Patient not taking: Reported on 03/12/2015 11/26/14   Ward, Josette SAILOR, DO  oxyCODONE -acetaminophen  (PERCOCET/ROXICET) 5-325 MG tablet Take 1 tablet by mouth every 6 (six) hours as needed for severe pain (pain score 7-10). 03/06/24   Dean Clarity, MD  Plecanatide  (TRULANCE ) 3 MG TABS Take 1 tablet (3 mg total) by mouth daily. 09/14/23     Plecanatide  (TRULANCE ) 3 MG TABS Take 1 tablet (3 mg total) by mouth daily. 12/26/23     Plecanatide  (TRULANCE ) 3 MG TABS Take 1 tablet (3 mg total) by mouth daily. 05/24/24     potassium chloride  SA (K-DUR,KLOR-CON ) 20 MEQ tablet Take 20 mEq by mouth daily.    [provider]  predniSONE (STERAPRED UNI-PAK 21 TAB) 10 MG (21) TBPK tablet Take by mouth. 08/20/21   [provider]  pyridOXINE  (VITAMIN B-6) 100 MG tablet Take 100 mg by mouth daily.    [provider]  QUEtiapine  (SEROQUEL ) 100 MG tablet Take 100 mg by mouth at bedtime.    [provider]  thiamine  (VITAMIN B-1) 100 MG tablet Take 100 mg by mouth daily.    [provider]  tirzepatide  (MOUNJARO ) 10 MG/0.5ML Pen Inject 10 mg into the skin once a week. 07/01/24     tirzepatide  (MOUNJARO ) 10 MG/0.5ML Pen Inject 10 mg into the skin once a week. 07/18/24     tirzepatide  (MOUNJARO ) 7.5 MG/0.5ML Pen Inject 7.5 mg into the skin once a week. 09/14/23     tiZANidine  (ZANAFLEX ) 4 MG capsule Take 4 mg by mouth at bedtime.    [provider]  traMADol  (ULTRAM ) 50 MG tablet Take 50 mg by mouth every 6 (six) hours as needed for moderate pain or severe pain.    [provider]  triamterene-hydrochlorothiazide (MAXZIDE) 75-50 MG per tablet Take 0.5 tablets by mouth daily.     [provider]  vitamin B-12 (CYANOCOBALAMIN ) 100 MCG tablet Take 100 mcg by mouth daily.    [provider]    Allergies: Cefazolin, Gluten meal, Cinnamon,  Codeine, Meperidine hcl, Milk protein, Morphine, Penicillamine, Penicillins, Sulfonamide derivatives, and Sulfur    Review of Systems  Constitutional:  Negative for appetite change and fatigue.  HENT:  Negative for congestion, ear discharge and sinus pressure.        Sore throat  Eyes:  Negative for discharge.  Respiratory:  Negative for cough.   Cardiovascular:  Negative for chest pain.  Gastrointestinal:  Negative for abdominal pain and diarrhea.  Genitourinary:  Negative for frequency and hematuria.  Musculoskeletal:  Negative for back pain.  Skin:  Negative for rash.  Neurological:  Positive for weakness. Negative for seizures and headaches.  Psychiatric/Behavioral:  Negative for hallucinations.  Updated Vital Signs BP 123/62   Pulse (!) 109   Temp 100 F (37.8 C) (Oral)   Resp 18   Ht 5' (1.524 m)   Wt 56.2 kg   SpO2 96%   BMI 24.22 kg/m   Physical Exam Vitals and nursing note reviewed.  Constitutional:      Appearance: She is well-developed.  HENT:     Head: Normocephalic.     Nose: Nose normal.     Mouth/Throat:     Mouth: Mucous membranes are dry.     Comments: Pharynx difficult to completely examine but the parents had seen is mildly inflamed.  She also has minimal blisters to the lower lip Eyes:     General: No scleral icterus.    Conjunctiva/sclera: Conjunctivae normal.  Neck:     Thyroid : No thyromegaly.  Cardiovascular:     Rate and Rhythm: Normal rate and regular rhythm.     Heart sounds: No murmur heard.    No friction rub. No gallop.  Pulmonary:     Breath sounds: No stridor. No wheezing or rales.  Chest:     Chest wall: No tenderness.  Abdominal:     General: There is no distension.     Tenderness: There is no abdominal tenderness. There is no rebound.  Musculoskeletal:        General: Normal range of motion.     Cervical back: Neck supple.  Lymphadenopathy:     Cervical: No cervical adenopathy.  Skin:    Findings: No erythema or rash.   Neurological:     Mental Status: She is alert and oriented to person, place, and time.     Motor: No abnormal muscle tone.     Coordination: Coordination normal.  Psychiatric:        Behavior: Behavior normal.     (all labs ordered are listed, but only abnormal results are displayed) Labs Reviewed  CBC WITH DIFFERENTIAL/PLATELET - Abnormal; Notable for the following components:      Result Value   WBC 2.3 (*)    RBC 3.16 (*)    Hemoglobin 10.3 (*)    HCT 30.2 (*)    Platelets 53 (*)    Lymphs Abs 0.3 (*)    All other components within normal limits  COMPREHENSIVE METABOLIC PANEL WITH GFR - Abnormal; Notable for the following components:   Sodium 132 (*)    Potassium 2.8 (*)    Chloride 95 (*)    Glucose, Bld 150 (*)    BUN 34 (*)    Creatinine, Ser 1.19 (*)    Calcium 8.7 (*)    Albumin 3.3 (*)    AST 65 (*)    GFR, Estimated 49 (*)    All other components within normal limits  GROUP A STREP BY PCR  RESP PANEL BY RT-PCR (RSV, FLU A&B, COVID)  [id number]  LACTIC ACID, PLASMA  MONONUCLEOSIS SCREEN    EKG: None  Radiology: DG Chest Port 1 View Result Date: 08/03/2024 CLINICAL DATA:  Shortness of breath EXAM: PORTABLE CHEST 1 VIEW COMPARISON:  12/26/2006 FINDINGS: Heart and mediastinal contours are within normal limits. No focal opacities or effusions. No acute bony abnormality. Aortic atherosclerosis. IMPRESSION: No active cardiopulmonary disease. Electronically Signed   By: Franky Crease M.D.   On: 08/03/2024 22:03     Procedures   Medications Ordered in the ED  sodium chloride  0.9 % bolus 1,000 mL (1,000 mLs Intravenous New Bag/Given 08/03/24 2141)  acetaminophen  (TYLENOL ) tablet 650  mg (650 mg Oral Given 08/03/24 2142)   Patient has pancytopenia with a platelet of 53,000 on her CBC.  She also has hypokalemia and mild AKI, patient has had blood cultures done and antibiotics started in fluids given.  CT scan of the neck pending.  Disposition will be done by Dr. Melvenia                                  Medical Decision Making Amount and/or Complexity of Data Reviewed Labs: ordered. Radiology: ordered.  Risk OTC drugs. Prescription drug management. Decision regarding hospitalization.        Final diagnoses:  None    ED Discharge Orders     None          Suzette Pac, MD 08/04/24 1050

## 2024-08-04 DIAGNOSIS — D61818 Other pancytopenia: Secondary | ICD-10-CM | POA: Diagnosis not present

## 2024-08-04 DIAGNOSIS — I509 Heart failure, unspecified: Secondary | ICD-10-CM | POA: Diagnosis not present

## 2024-08-04 DIAGNOSIS — E860 Dehydration: Secondary | ICD-10-CM | POA: Diagnosis not present

## 2024-08-04 DIAGNOSIS — R162 Hepatomegaly with splenomegaly, not elsewhere classified: Secondary | ICD-10-CM | POA: Diagnosis not present

## 2024-08-04 DIAGNOSIS — F32A Depression, unspecified: Secondary | ICD-10-CM | POA: Diagnosis not present

## 2024-08-04 DIAGNOSIS — W07XXXA Fall from chair, initial encounter: Secondary | ICD-10-CM | POA: Diagnosis present

## 2024-08-04 DIAGNOSIS — I5031 Acute diastolic (congestive) heart failure: Secondary | ICD-10-CM | POA: Diagnosis not present

## 2024-08-04 DIAGNOSIS — R0602 Shortness of breath: Secondary | ICD-10-CM | POA: Diagnosis not present

## 2024-08-04 DIAGNOSIS — E861 Hypovolemia: Secondary | ICD-10-CM | POA: Diagnosis not present

## 2024-08-04 DIAGNOSIS — K1230 Oral mucositis (ulcerative), unspecified: Secondary | ICD-10-CM | POA: Diagnosis not present

## 2024-08-04 DIAGNOSIS — E871 Hypo-osmolality and hyponatremia: Secondary | ICD-10-CM | POA: Diagnosis present

## 2024-08-04 DIAGNOSIS — A419 Sepsis, unspecified organism: Secondary | ICD-10-CM | POA: Diagnosis not present

## 2024-08-04 DIAGNOSIS — E1142 Type 2 diabetes mellitus with diabetic polyneuropathy: Secondary | ICD-10-CM | POA: Diagnosis not present

## 2024-08-04 DIAGNOSIS — E876 Hypokalemia: Secondary | ICD-10-CM | POA: Diagnosis not present

## 2024-08-04 DIAGNOSIS — L4059 Other psoriatic arthropathy: Secondary | ICD-10-CM | POA: Diagnosis not present

## 2024-08-04 DIAGNOSIS — F419 Anxiety disorder, unspecified: Secondary | ICD-10-CM | POA: Diagnosis not present

## 2024-08-04 DIAGNOSIS — E785 Hyperlipidemia, unspecified: Secondary | ICD-10-CM | POA: Diagnosis not present

## 2024-08-04 DIAGNOSIS — N179 Acute kidney failure, unspecified: Secondary | ICD-10-CM | POA: Diagnosis not present

## 2024-08-04 DIAGNOSIS — Z8249 Family history of ischemic heart disease and other diseases of the circulatory system: Secondary | ICD-10-CM | POA: Diagnosis not present

## 2024-08-04 DIAGNOSIS — K120 Recurrent oral aphthae: Secondary | ICD-10-CM | POA: Diagnosis not present

## 2024-08-04 DIAGNOSIS — R509 Fever, unspecified: Secondary | ICD-10-CM | POA: Diagnosis not present

## 2024-08-04 DIAGNOSIS — Z87891 Personal history of nicotine dependence: Secondary | ICD-10-CM | POA: Diagnosis not present

## 2024-08-04 DIAGNOSIS — Z79899 Other long term (current) drug therapy: Secondary | ICD-10-CM | POA: Diagnosis not present

## 2024-08-04 DIAGNOSIS — Z1152 Encounter for screening for COVID-19: Secondary | ICD-10-CM | POA: Diagnosis not present

## 2024-08-04 DIAGNOSIS — Y92008 Other place in unspecified non-institutional (private) residence as the place of occurrence of the external cause: Secondary | ICD-10-CM | POA: Diagnosis not present

## 2024-08-04 DIAGNOSIS — Z7984 Long term (current) use of oral hypoglycemic drugs: Secondary | ICD-10-CM | POA: Diagnosis not present

## 2024-08-04 DIAGNOSIS — K121 Other forms of stomatitis: Secondary | ICD-10-CM | POA: Diagnosis not present

## 2024-08-04 DIAGNOSIS — R7401 Elevation of levels of liver transaminase levels: Secondary | ICD-10-CM | POA: Diagnosis not present

## 2024-08-04 DIAGNOSIS — M797 Fibromyalgia: Secondary | ICD-10-CM | POA: Diagnosis not present

## 2024-08-04 DIAGNOSIS — L4050 Arthropathic psoriasis, unspecified: Secondary | ICD-10-CM | POA: Diagnosis not present

## 2024-08-04 DIAGNOSIS — I700 Atherosclerosis of aorta: Secondary | ICD-10-CM | POA: Diagnosis not present

## 2024-08-04 DIAGNOSIS — Z7985 Long-term (current) use of injectable non-insulin antidiabetic drugs: Secondary | ICD-10-CM | POA: Diagnosis not present

## 2024-08-04 LAB — CBC
HCT: 25.6 % — ABNORMAL LOW (ref 36.0–46.0)
Hemoglobin: 8.7 g/dL — ABNORMAL LOW (ref 12.0–15.0)
MCH: 33.3 pg (ref 26.0–34.0)
MCHC: 34 g/dL (ref 30.0–36.0)
MCV: 98.1 fL (ref 80.0–100.0)
Platelets: 40 K/uL — ABNORMAL LOW (ref 150–400)
RBC: 2.61 MIL/uL — ABNORMAL LOW (ref 3.87–5.11)
RDW: 13.7 % (ref 11.5–15.5)
WBC: 2 K/uL — ABNORMAL LOW (ref 4.0–10.5)
nRBC: 0 % (ref 0.0–0.2)

## 2024-08-04 LAB — PROCALCITONIN: Procalcitonin: 0.19 ng/mL

## 2024-08-04 LAB — HEMOGLOBIN A1C
Hgb A1c MFr Bld: 5.6 % (ref 4.8–5.6)
Mean Plasma Glucose: 114.02 mg/dL

## 2024-08-04 LAB — PROTIME-INR
INR: 1.2 (ref 0.8–1.2)
Prothrombin Time: 15.4 s — ABNORMAL HIGH (ref 11.4–15.2)

## 2024-08-04 LAB — CORTISOL-AM, BLOOD: Cortisol - AM: 11.1 ug/dL (ref 6.7–22.6)

## 2024-08-04 LAB — CBG MONITORING, ED
Glucose-Capillary: 87 mg/dL (ref 70–99)
Glucose-Capillary: 109 mg/dL — ABNORMAL HIGH (ref 70–99)

## 2024-08-04 LAB — BASIC METABOLIC PANEL WITH GFR
Anion gap: 7 (ref 5–15)
BUN: 29 mg/dL — ABNORMAL HIGH (ref 8–23)
CO2: 22 mmol/L (ref 22–32)
Calcium: 7.9 mg/dL — ABNORMAL LOW (ref 8.9–10.3)
Chloride: 104 mmol/L (ref 98–111)
Creatinine, Ser: 1.01 mg/dL — ABNORMAL HIGH (ref 0.44–1.00)
GFR, Estimated: >60 mL/min (ref >60)
Glucose, Bld: 103 mg/dL — ABNORMAL HIGH (ref 70–99)
Potassium: 3.2 mmol/L — ABNORMAL LOW (ref 3.5–5.1)
Sodium: 133 mmol/L — ABNORMAL LOW (ref 135–145)

## 2024-08-04 LAB — GLUCOSE, CAPILLARY
Glucose-Capillary: 94 mg/dL (ref 70–99)
Glucose-Capillary: 107 mg/dL — ABNORMAL HIGH (ref 70–99)

## 2024-08-04 LAB — MONONUCLEOSIS SCREEN: Mono Screen: NEGATIVE

## 2024-08-04 LAB — HIV ANTIBODY (ROUTINE TESTING W REFLEX): HIV Screen 4th Generation wRfx: NONREACTIVE

## 2024-08-04 MED ORDER — FOLIC ACID 1 MG PO TABS
3.0000 mg | ORAL_TABLET | Freq: Every day | ORAL | Status: DC
  Administered 2024-08-05 – 2024-08-10 (×6): 3 mg via ORAL
  Filled 2024-08-04 (×6): qty 3

## 2024-08-04 MED ORDER — FOLIC ACID 1 MG PO TABS: 1.0000 mg | ORAL_TABLET | Freq: Every day | ORAL | Status: DC

## 2024-08-04 MED ORDER — LORATADINE 10 MG PO TABS
10.0000 mg | ORAL_TABLET | Freq: Every day | ORAL | Status: DC
  Administered 2024-08-04 – 2024-08-09 (×6): 10 mg via ORAL
  Filled 2024-08-04 (×6): qty 1

## 2024-08-04 MED ORDER — LEVOCETIRIZINE DIHYDROCHLORIDE 5 MG PO TABS: 5.0000 mg | ORAL_TABLET | Freq: Every day | ORAL | Status: DC

## 2024-08-04 MED ORDER — SODIUM CHLORIDE 0.9 % IV SOLN: INTRAVENOUS | Status: AC

## 2024-08-04 MED ORDER — TIZANIDINE HCL 4 MG PO TABS: 4.0000 mg | ORAL_TABLET | Freq: Three times a day (TID) | ORAL | Status: DC | PRN

## 2024-08-04 MED ORDER — BENZONATATE 100 MG PO CAPS: 100.0000 mg | ORAL_CAPSULE | ORAL | Status: DC | PRN

## 2024-08-04 MED ORDER — THIAMINE MONONITRATE 100 MG PO TABS
100.0000 mg | ORAL_TABLET | Freq: Every day | ORAL | Status: DC
  Administered 2024-08-04 – 2024-08-10 (×7): 100 mg via ORAL
  Filled 2024-08-04 (×7): qty 1

## 2024-08-04 MED ORDER — ONDANSETRON HCL 4 MG PO TABS
4.0000 mg | ORAL_TABLET | Freq: Four times a day (QID) | ORAL | Status: DC | PRN
Start: 2024-08-04 — End: 2024-08-10

## 2024-08-04 MED ORDER — VANCOMYCIN HCL IN DEXTROSE 1-5 GM/200ML-% IV SOLN: 1000.0000 mg | Freq: Once | INTRAVENOUS | Status: DC

## 2024-08-04 MED ORDER — SODIUM CHLORIDE 0.9 % IV SOLN: INTRAVENOUS | Status: DC

## 2024-08-04 MED ORDER — ACETAMINOPHEN 650 MG RE SUPP: 650.0000 mg | Freq: Four times a day (QID) | RECTAL | Status: DC | PRN

## 2024-08-04 MED ORDER — INSULIN ASPART 100 UNIT/ML IJ SOLN
0.0000 [IU] | Freq: Three times a day (TID) | INTRAMUSCULAR | Status: DC
  Administered 2024-08-06 – 2024-08-09 (×5): 1 [IU] via SUBCUTANEOUS

## 2024-08-04 MED ORDER — BUPROPION HCL ER (XL) 150 MG PO TB24: 300.0000 mg | ORAL_TABLET | Freq: Every day | ORAL | Status: DC

## 2024-08-04 MED ORDER — DULOXETINE HCL 30 MG PO CPEP: 60.0000 mg | ORAL_CAPSULE | Freq: Every day | ORAL | Status: DC

## 2024-08-04 MED ORDER — VANCOMYCIN HCL 750 MG/150ML IV SOLN
750.0000 mg | INTRAVENOUS | Status: DC
  Administered 2024-08-05: 750 mg via INTRAVENOUS
  Filled 2024-08-04 (×2): qty 150

## 2024-08-04 MED ORDER — METRONIDAZOLE 500 MG/100ML IV SOLN
500.0000 mg | Freq: Two times a day (BID) | INTRAVENOUS | Status: DC
  Administered 2024-08-04 – 2024-08-05 (×3): 500 mg via INTRAVENOUS
  Filled 2024-08-04 (×3): qty 100

## 2024-08-04 MED ORDER — BENZONATATE 100 MG PO CAPS: 200.0000 mg | ORAL_CAPSULE | ORAL | Status: DC | PRN

## 2024-08-04 MED ORDER — ONDANSETRON HCL 4 MG/2ML IJ SOLN: 4.0000 mg | Freq: Four times a day (QID) | INTRAMUSCULAR | Status: DC | PRN

## 2024-08-04 MED ORDER — GABAPENTIN 300 MG PO CAPS
300.0000 mg | ORAL_CAPSULE | Freq: Three times a day (TID) | ORAL | Status: DC
  Administered 2024-08-04 – 2024-08-10 (×18): 300 mg via ORAL
  Filled 2024-08-04 (×18): qty 1

## 2024-08-04 MED ORDER — LINACLOTIDE 145 MCG PO CAPS: 145.0000 ug | ORAL_CAPSULE | ORAL | Status: DC

## 2024-08-04 MED ORDER — MAGNESIUM HYDROXIDE 400 MG/5ML PO SUSP
30.0000 mL | Freq: Every day | ORAL | Status: DC | PRN
  Administered 2024-08-09: 30 mL via ORAL
  Filled 2024-08-04: qty 30

## 2024-08-04 MED ORDER — VITAMIN B-12 100 MCG PO TABS
100.0000 ug | ORAL_TABLET | Freq: Every day | ORAL | Status: DC
  Administered 2024-08-04 – 2024-08-10 (×7): 100 ug via ORAL
  Filled 2024-08-04 (×7): qty 1

## 2024-08-04 MED ORDER — INSULIN GLARGINE-YFGN 100 UNIT/ML ~~LOC~~ SOLN
12.0000 [IU] | Freq: Every day | SUBCUTANEOUS | Status: DC
  Filled 2024-08-04 (×3): qty 0.12

## 2024-08-04 MED ORDER — HYDROMORPHONE HCL 2 MG PO TABS: 2.0000 mg | ORAL_TABLET | ORAL | Status: DC | PRN

## 2024-08-04 MED ORDER — MAGNESIUM GLUCONATE 500 (27 MG) MG PO TABS
500.0000 mg | ORAL_TABLET | Freq: Every day | ORAL | Status: DC
  Administered 2024-08-05: 500 mg via ORAL
  Filled 2024-08-04 (×3): qty 1

## 2024-08-04 MED ORDER — VANCOMYCIN HCL 1250 MG/250ML IV SOLN: 1250.0000 mg | INTRAVENOUS | Status: DC

## 2024-08-04 MED ORDER — POTASSIUM CHLORIDE CRYS ER 20 MEQ PO TBCR: 20.0000 meq | EXTENDED_RELEASE_TABLET | Freq: Every day | ORAL | Status: DC

## 2024-08-04 MED ORDER — SODIUM CHLORIDE 0.9 % IV SOLN
2.0000 g | Freq: Three times a day (TID) | INTRAVENOUS | Status: DC
  Administered 2024-08-04 – 2024-08-05 (×4): 2 g via INTRAVENOUS
  Filled 2024-08-04 (×7): qty 10
  Filled 2024-08-04: qty 2

## 2024-08-04 MED ORDER — QUETIAPINE FUMARATE 100 MG PO TABS
100.0000 mg | ORAL_TABLET | Freq: Every day | ORAL | Status: DC
  Administered 2024-08-04 – 2024-08-09 (×7): 100 mg via ORAL
  Filled 2024-08-04 (×4): qty 1
  Filled 2024-08-04: qty 4
  Filled 2024-08-04 (×2): qty 1

## 2024-08-04 MED ORDER — INSULIN ASPART 100 UNIT/ML IJ SOLN: 3.0000 [IU] | Freq: Three times a day (TID) | INTRAMUSCULAR | Status: DC

## 2024-08-04 MED ORDER — DIAZEPAM 5 MG PO TABS: 5.0000 mg | ORAL_TABLET | Freq: Two times a day (BID) | ORAL | Status: DC | PRN

## 2024-08-04 MED ORDER — VANCOMYCIN HCL 1500 MG/300ML IV SOLN
1500.0000 mg | Freq: Once | INTRAVENOUS | Status: AC
  Administered 2024-08-04: 1500 mg via INTRAVENOUS
  Filled 2024-08-04: qty 300

## 2024-08-04 MED ORDER — PYRIDOXINE HCL 25 MG PO TABS
100.0000 mg | ORAL_TABLET | Freq: Every day | ORAL | Status: DC
  Administered 2024-08-05 – 2024-08-10 (×6): 100 mg via ORAL
  Filled 2024-08-04 (×8): qty 4

## 2024-08-04 MED ORDER — HYDROMORPHONE HCL 2 MG PO TABS
2.0000 mg | ORAL_TABLET | ORAL | Status: DC | PRN
  Administered 2024-08-04 – 2024-08-05 (×4): 2 mg via ORAL
  Filled 2024-08-04 (×4): qty 1

## 2024-08-04 MED ORDER — HYDROMORPHONE HCL 2 MG PO TABS
2.0000 mg | ORAL_TABLET | ORAL | Status: DC
  Administered 2024-08-04 (×2): 2 mg via ORAL
  Filled 2024-08-04 (×2): qty 1

## 2024-08-04 MED ORDER — TRAZODONE HCL 50 MG PO TABS
25.0000 mg | ORAL_TABLET | Freq: Every evening | ORAL | Status: DC | PRN
  Administered 2024-08-09: 25 mg via ORAL
  Filled 2024-08-04: qty 1

## 2024-08-04 MED ORDER — POTASSIUM CHLORIDE 10 MEQ/100ML IV SOLN
10.0000 meq | INTRAVENOUS | Status: AC
  Administered 2024-08-04 (×4): 10 meq via INTRAVENOUS
  Filled 2024-08-04 (×4): qty 100

## 2024-08-04 MED ORDER — ACETAMINOPHEN 325 MG PO TABS
650.0000 mg | ORAL_TABLET | Freq: Four times a day (QID) | ORAL | Status: DC | PRN
Start: 2024-08-04 — End: 2024-08-10
  Administered 2024-08-04 – 2024-08-08 (×3): 650 mg via ORAL
  Filled 2024-08-04 (×3): qty 2

## 2024-08-04 MED ORDER — LACTATED RINGERS IV SOLN: 150.0000 mL/h | INTRAVENOUS | Status: DC

## 2024-08-04 MED ORDER — INSULIN DEGLUDEC 100 UNIT/ML ~~LOC~~ SOLN: 16.0000 [IU] | Freq: Every day | SUBCUTANEOUS | Status: DC

## 2024-08-04 MED ORDER — ALPRAZOLAM 0.5 MG PO TABS
0.5000 mg | ORAL_TABLET | Freq: Every evening | ORAL | Status: DC | PRN
  Administered 2024-08-08 – 2024-08-09 (×2): 0.5 mg via ORAL
  Filled 2024-08-04 (×2): qty 1

## 2024-08-04 MED ORDER — METHOTREXATE SODIUM 2.5 MG PO TABS: 20.0000 mg | ORAL_TABLET | ORAL | Status: DC

## 2024-08-04 MED ORDER — TIZANIDINE HCL 4 MG PO CAPS: 4.0000 mg | ORAL_CAPSULE | Freq: Every day | ORAL | Status: DC

## 2024-08-04 MED ORDER — PLECANATIDE 3 MG PO TABS: 3.0000 mg | ORAL_TABLET | Freq: Every day | ORAL | Status: DC

## 2024-08-04 MED ORDER — TRAMADOL HCL 50 MG PO TABS: 50.0000 mg | ORAL_TABLET | Freq: Four times a day (QID) | ORAL | Status: DC | PRN

## 2024-08-04 NOTE — Assessment & Plan Note (Signed)
-   Will continue anti- hyperlipidemic therapy.

## 2024-08-04 NOTE — Assessment & Plan Note (Addendum)
-   The patient will be admitted to a medical telemetry bed. - Will continue to be casted.  Hydration with IV normal saline. - Will continue therapy with broad-spectrum antibiotics traumatized with IV vancomycin  and aztreonam  patient given her pancytopenia. - Will follow blood cultures. - Hematology consult can be called later this a.m.

## 2024-08-04 NOTE — Assessment & Plan Note (Signed)
-   Potassium will be replaced and magnesium level rechecked.

## 2024-08-04 NOTE — Progress Notes (Addendum)
 Pharmacy Antibiotic Note  [name] is a 68 y.o. female admitted on 08/03/2024 with generalized weakness secondary to ongoing mouth/throat pain.  Pharmacy has been consulted for vancomycin  dosing for sepsis of unknown source.  -CXR: negative for active disease -CT: No acute findings or discrete mass in the neck -Given azithro in ED -WBC 2.3, sCr 1.19, low grade temp of 100 -Blood cultures collected, Resp panel neg, GAS swab neg -Reported allergy to cefazolin   Plan: -Aztreonam  2g IV every 8 hours -Flagyl  500mg  IV every 12 hours -Vancomycin  1500mg  IV x1 -Vancomycin  1250mg  IV every 48 hours (AUC 498, Vd 0.72, IBW, sCr 1.19) -Monitor renal function -Follow up signs of clinical improvement, LOT, de-escalation of antibiotics   Height: 5' (152.4 cm) Weight: 56.2 kg (124 lb) IBW/kg (Calculated) : 45.5  Temp (24hrs), Avg:100 F (37.8 C), Min:100 F (37.8 C), Max:100 F (37.8 C)  Recent Labs  Lab 08/03/24 2202  WBC 2.3*  CREATININE 1.19*  LATICACIDVEN 1.5    Estimated Creatinine Clearance: 35.1 mL/min (A) (by C-G formula based on SCr of 1.19 mg/dL (H)).    Allergies  Allergen Reactions   Cefazolin Anaphylaxis    ANCEF   Gluten Meal Other (See Comments)    Stomach pain, bloating   Cinnamon Other (See Comments)    Nasal congestion   Codeine Nausea Only   Meperidine Hcl Nausea Only   Milk Protein Other (See Comments)    Lactose intolerant  bloating   Morphine Nausea Only   Penicillamine    Penicillins Hives   Sulfonamide Derivatives Other (See Comments)    REACTION IS UNKNOWN   Sulfur Other (See Comments)    Antimicrobials this admission: Aztreonam  8/17 >>  Vancomycin  8/17 >>   Microbiology results: 8/16 BCx:   Thank you for allowing pharmacy to be a part of this patient's care.  Lynwood Poplar, PharmD, BCPS Clinical Pharmacist 08/04/2024 1:57 AM

## 2024-08-04 NOTE — Assessment & Plan Note (Signed)
-   This is likely prerenal due to mild hypovolemia and dehydration. - The patient will be hydrated with IV normal saline with IV potassium chloride  and will follow BMP. - Will avoid nephrotoxins.

## 2024-08-04 NOTE — Plan of Care (Signed)
  Problem: Fluid Volume: Goal: Hemodynamic stability will improve Outcome: Progressing   Problem: Clinical Measurements: Goal: Signs and symptoms of infection will decrease Outcome: Progressing   Problem: Respiratory: Goal: Ability to maintain adequate ventilation will improve Outcome: Progressing   Problem: Education: Goal: Ability to describe self-care measures that may prevent or decrease complications (Diabetes Survival Skills Education) will improve Outcome: Progressing

## 2024-08-04 NOTE — H&P (Addendum)
 I do not think anything is wrong really happy with my life stating you never felt anything like this provide hi home     [place]   PATIENT NAME: [name]    MR#:  [id number]  DATE OF BIRTH:  01-12-1956  DATE OF ADMISSION:  08/03/2024  PRIMARY CARE PHYSICIAN: Shona Norleen PEDLAR, MD   Patient is coming from: Home  REQUESTING/REFERRING PHYSICIAN: Suzette Pac, MD  CHIEF COMPLAINT:   Chief Complaint  Patient presents with   Fall    HISTORY OF PRESENT ILLNESS:  [name] is a 68 y.o. Caucasian female with medical history significant for fibromyalgia, psoriatic arthritis on methotrexate , celiac disease, osteoarthritis and diverticulitis, who presented to the emergency room with acute onset of accidental mechanical fall.  She has been getting weaker lately and slipped off of her couch.  No head injuries.  She did not have any pain.  She has been having severe stool throat which has been treated with antibiotics without improvement.  No cough or wheezing or dyspnea.  No chest pain or palpitations.  No fever or chills.  No nausea or vomiting or abdominal pain.  No bleeding diathesis.  ED Course: When she came to the ER heart rate was 115 with temperature 100 and pulse ox O2 was 99% on 2 L of O2 by nasal cannula.  Labs revealed hyponatremia and hypochloremia with hypokalemia BUN of 34 with creatinine 1.19 previously normal with a calcium of 8.7 and a AST of 65, albumin 3.3 with total protein of 7.  Lactic acid was 1.5 and CBC showed leukopenia of 2.3 and anemia with hemoglobin 10.3 and hematocrit 30.2 previously normal however 9 years here.  Also showed thrombocytopenia 53.  Respiratory panel came back negative.  Group a strep PCR came back negative. EKG as reviewed by me : EKG showed sinus tachycardia with a rate of 115 with Q waves in V1 and mild ST segment depression in leads lateral leads. Imaging: CT soft tissue neck with contrast revealed no acute findings or discrete mass in the  neck.  Portable chest x-ray showed no acute cardiopulmonary disease.  The patient was given IV vancomycin  and 1-1/2 L of IV normal saline bolus as well as 500 mg IV Zithromax  and 650 mg p.o. Tylenol .  She will be admitted to a medical telemetry bed for further evaluation and management. PAST MEDICAL HISTORY:   Past Medical History:  Diagnosis Date   Arthritis    Celiac disease    Diverticulitis    Fibromyalgia    Osteoarthritis (arthritis due to wear and tear of joints)    Renal disorder     PAST SURGICAL HISTORY:   Past Surgical History:  Procedure Laterality Date   APPENDECTOMY     BACK SURGERY     Patient states that she has had 5 surgeries   COLONOSCOPY     2011   NECK SURGERY     Cerivcal Fusion   NECK SURGERY     REDUCTION MAMMAPLASTY Bilateral    TONSILLECTOMY AND ADENOIDECTOMY     UPPER GASTROINTESTINAL ENDOSCOPY  2011    SOCIAL HISTORY:   Social History   Tobacco Use   Smoking status: Former    Types: Cigarettes    Start date: 04/24/2012   Smokeless tobacco: Never  Substance Use Topics   Alcohol use: No    FAMILY HISTORY:   Family History  Problem Relation Age of Onset   Heart disease Mother     DRUG  ALLERGIES:   Allergies  Allergen Reactions   Cefazolin Anaphylaxis    ANCEF   Gluten Meal Other (See Comments)    Stomach pain, bloating   Cinnamon Other (See Comments)    Nasal congestion   Codeine Nausea Only   Meperidine Hcl Nausea Only   Milk Protein Other (See Comments)    Lactose intolerant  bloating   Morphine Nausea Only   Penicillamine    Penicillins Hives   Sulfonamide Derivatives Other (See Comments)    REACTION IS UNKNOWN   Sulfur Other (See Comments)    REVIEW OF SYSTEMS:   ROS As per history of present illness. All pertinent systems were reviewed above. Constitutional, HEENT, cardiovascular, respiratory, GI, GU, musculoskeletal, neuro, psychiatric, endocrine, integumentary and hematologic systems were reviewed and are  otherwise negative/unremarkable except for positive findings mentioned above in the HPI.   MEDICATIONS AT HOME:   Prior to Admission medications   Medication Sig Start Date End Date Taking? Authorizing Provider  ALPRAZolam  (XANAX ) 0.5 MG tablet Take 0.5 mg by mouth at bedtime as needed for sleep.    [provider]  ALPRAZolam  (XANAX ) 0.5 MG tablet 1 tablet 08/26/20   [provider]  benzonatate  (TESSALON ) 200 MG capsule SMARTSIG:1 Capsule(s) By Mouth 08/10/21   [provider]  Buprenorphine 15 MCG/HR PTWK Place onto the skin.    [provider]  buPROPion  (WELLBUTRIN  XL) 300 MG 24 hr tablet Take 300 mg by mouth daily. 09/08/21   [provider]  Cholecalciferol (VITAMIN D3) 3000 UNITS TABS Take by mouth. Patient states hat she takes every other day    [provider]  diazepam  (VALIUM ) 5 MG tablet Take 1 tablet (5 mg total) by mouth every 8 (eight) hours as needed for muscle spasms. Patient not taking: Reported on 03/12/2015 11/26/14   Ward, Josette SAILOR, DO  diazepam  (VALIUM ) 5 MG tablet Take 1 tablet (5 mg total) by mouth every 12 (twelve) hours as needed for muscle spasms. 03/06/24   Haviland, Julie, MD  diclofenac (VOLTAREN) 75 MG EC tablet 1 tablet as needed 06/22/21   [provider]  DULoxetine  (CYMBALTA ) 30 MG capsule Take 60 mg by mouth daily.     [provider]  folic acid  (FOLVITE ) 1 MG tablet TAKE THREE TABLETS BY MOUTH ONCE A DAY. 07/22/20   [provider]  gabapentin  (NEURONTIN ) 300 MG capsule Take 300 mg by mouth 3 (three) times daily. 06/22/21   [provider]  HYDROmorphone  (DILAUDID ) 2 MG tablet Take 1 tablet (2 mg total) by mouth every 4 (four) hours. 03/07/24     ibuprofen  (ADVIL ) 600 MG tablet Take 1 tablet (600 mg total) by mouth every 6 (six) hours as needed. 03/06/24   Dean Clarity, MD  ibuprofen  (ADVIL ,MOTRIN ) 800 MG tablet Take 1 tablet (800 mg total) by mouth every 8 (eight) hours as  needed for mild pain. Patient not taking: Reported on 03/12/2015 11/26/14   Ward, Josette SAILOR, DO  Insulin  Degludec (TRESIBA ) 100 UNIT/ML SOLN 16 units    [provider]  levocetirizine (XYZAL ) 5 MG tablet Take 5 mg by mouth at bedtime. 03/11/15   [provider]  LINZESS  145 MCG CAPS capsule Take 145 mcg by mouth every 3 (three) days. 02/18/15   [provider]  magnesium  30 MG tablet Take 30 mg by mouth daily.    [provider]  metFORMIN (GLUCOPHAGE) 1000 MG tablet Take 1,000 mg by mouth 2 (two) times daily. 07/27/21  [provider]  metFORMIN (GLUCOPHAGE) 500 MG tablet Take 500 mg by mouth 2 (two) times daily. 02/14/15   [provider]  methotrexate  2.5 MG tablet Take 8 tablets by mouth once a week. 07/16/20   [provider]  ondansetron  (ZOFRAN  ODT) 4 MG disintegrating tablet Take 1 tablet (4 mg total) by mouth every 8 (eight) hours as needed for nausea or vomiting. Patient not taking: Reported on 03/12/2015 11/26/14   Ward, Josette SAILOR, DO  oxyCODONE -acetaminophen  (PERCOCET/ROXICET) 5-325 MG per tablet Take 1 tablet by mouth every 6 (six) hours as needed. Patient not taking: Reported on 03/12/2015 11/26/14   Ward, Josette SAILOR, DO  oxyCODONE -acetaminophen  (PERCOCET/ROXICET) 5-325 MG tablet Take 1 tablet by mouth every 6 (six) hours as needed for severe pain (pain score 7-10). 03/06/24   Dean Clarity, MD  Plecanatide  (TRULANCE ) 3 MG TABS Take 1 tablet (3 mg total) by mouth daily. 09/14/23     Plecanatide  (TRULANCE ) 3 MG TABS Take 1 tablet (3 mg total) by mouth daily. 12/26/23     Plecanatide  (TRULANCE ) 3 MG TABS Take 1 tablet (3 mg total) by mouth daily. 05/24/24     potassium chloride  SA (K-DUR,KLOR-CON ) 20 MEQ tablet Take 20 mEq by mouth daily.    [provider]  predniSONE (STERAPRED UNI-PAK 21 TAB) 10 MG (21) TBPK tablet Take by mouth. 08/20/21   [provider]  pyridOXINE  (VITAMIN B-6) 100 MG tablet Take 100 mg by mouth  daily.    [provider]  QUEtiapine  (SEROQUEL ) 100 MG tablet Take 100 mg by mouth at bedtime.    [provider]  thiamine  (VITAMIN B-1) 100 MG tablet Take 100 mg by mouth daily.    [provider]  tirzepatide  (MOUNJARO ) 10 MG/0.5ML Pen Inject 10 mg into the skin once a week. 07/01/24     tirzepatide  (MOUNJARO ) 10 MG/0.5ML Pen Inject 10 mg into the skin once a week. 07/18/24     tirzepatide  (MOUNJARO ) 7.5 MG/0.5ML Pen Inject 7.5 mg into the skin once a week. 09/14/23     tiZANidine  (ZANAFLEX ) 4 MG capsule Take 4 mg by mouth at bedtime.    [provider]  traMADol  (ULTRAM ) 50 MG tablet Take 50 mg by mouth every 6 (six) hours as needed for moderate pain or severe pain.    [provider]  triamterene-hydrochlorothiazide (MAXZIDE) 75-50 MG per tablet Take 0.5 tablets by mouth daily.     [provider]  vitamin B-12 (CYANOCOBALAMIN ) 100 MCG tablet Take 100 mcg by mouth daily.    [provider]      VITAL SIGNS:  Blood pressure (!) 100/59, pulse 93, temperature 100 F (37.8 C), temperature source Oral, resp. rate 14, height 5' (1.524 m), weight 56.2 kg, SpO2 (!) 89%.  PHYSICAL EXAMINATION:  Physical Exam  GENERAL:  68 y.o.-year-old Caucasian female patient lying in the bed with no acute distress.  EYES: Pupils equal, round, reactive to light and accommodation. No scleral icterus. Extraocular muscles intact.  HEENT: Head atraumatic, normocephalic. Oropharynx with slightly erythematous pharynx and minimal blisters to her lower lip.  Nasopharynx clear.  NECK:  Supple, no jugular venous distention. No thyroid  enlargement, no tenderness.  LUNGS: Normal breath sounds bilaterally, no wheezing, rales,rhonchi or crepitation. No use of accessory muscles of respiration.  CARDIOVASCULAR: Regular rate and rhythm, S1, S2 normal. No murmurs, rubs, or gallops.  ABDOMEN: Soft, nondistended, nontender. Bowel sounds present. No organomegaly or mass.   EXTREMITIES: No pedal edema, cyanosis, or clubbing.  NEUROLOGIC: Cranial nerves II through XII are intact. Muscle strength 5/5 in all extremities. Sensation intact. Gait not checked.  PSYCHIATRIC: The patient is alert and oriented x 3.  Normal affect and good eye contact. SKIN: No obvious rash, lesion, or ulcer.   LABORATORY PANEL:   CBC Recent Labs  Lab 08/03/24 2202  WBC 2.3*  HGB 10.3*  HCT 30.2*  PLT 53*   ------------------------------------------------------------------------------------------------------------------  Chemistries  Recent Labs  Lab 08/03/24 2202  NA 132*  K 2.8*  CL 95*  CO2 25  GLUCOSE 150*  BUN 34*  CREATININE 1.19*  CALCIUM 8.7*  AST 65*  ALT 27  ALKPHOS 46  BILITOT 0.6   ------------------------------------------------------------------------------------------------------------------  Cardiac Enzymes No results for input(s): TROPONINI in the last 168 hours. ------------------------------------------------------------------------------------------------------------------  RADIOLOGY:  CT Soft Tissue Neck W Contrast Result Date: 08/03/2024 EXAM: CT NECK WITH CONTRAST 08/03/2024 11:06:11 PM TECHNIQUE: CT of the neck was performed with the administration of intravenous contrast. Multiplanar reformatted images are provided for review. Automated exposure control, iterative reconstruction, and/or weight based adjustment of the mA/kV was utilized to reduce the radiation dose to as low as reasonably achievable. CONTRAST: 75mL iohexol  (OMNIPAQUE ) 300 MG/ML solution COMPARISON: None available. CLINICAL HISTORY: Soft tissue infection suspected, neck, xray done. Pt. Clemens today and also complains of a current mouth infection. FINDINGS: AERODIGESTIVE TRACT: No discrete mass. No edema. SALIVARY GLANDS: The parotid and submandibular glands are unremarkable. THYROID : Unremarkable. LYMPH NODES: No suspicious cervical lymphadenopathy. SOFT TISSUES: No mass or fluid  collection. BRAIN, ORBITS, SINUSES AND MASTOIDS: No acute abnormality. LUNGS AND MEDIASTINUM: No acute abnormality. BONES: No focal bone abnormality. VASCULATURE: Calcific atherosclerosis of the aortic arch and carotid bifurcations. SPINE: Moderate spinal canal stenosis at the C5-6 level. IMPRESSION: 1. No acute findings or discrete mass in the neck. Electronically signed by: Franky Stanford MD 08/03/2024 11:34 PM EDT RP Workstation: [id number]   DG Chest Port 1 View Result Date: 08/03/2024 CLINICAL DATA:  Shortness of breath EXAM: PORTABLE CHEST 1 VIEW COMPARISON:  12/26/2006 FINDINGS: Heart and mediastinal contours are within normal limits. No focal opacities or effusions. No acute bony abnormality. Aortic atherosclerosis. IMPRESSION: No active cardiopulmonary disease. Electronically Signed   By: Franky Crease M.D.   On: 08/03/2024 22:03      IMPRESSION AND PLAN:  Assessment and Plan: * Sepsis due to undetermined organism [facility]) - The patient will be admitted to a medical telemetry bed. - Will continue to be casted.  Hydration with IV normal saline. - Will continue therapy with broad-spectrum antibiotics traumatized with IV vancomycin  and aztreonam  patient given her pancytopenia. - Will follow blood cultures. - Hematology consult can be called later this a.m.  Hypokalemia - Potassium will be replaced and magnesium  level rechecked.  AKI (acute kidney injury) (HCC) - This is likely prerenal due to mild hypovolemia and dehydration. - The patient will be hydrated with IV normal saline with IV potassium chloride  and will follow BMP. - Will avoid nephrotoxins.  Hyponatremia - This is likely hypovolemic due to volume depletion and dehydration. - Management as above.  Polyarticular psoriatic arthritis (HCC) We will continue methotrexate .  Type 2 diabetes mellitus with peripheral neuropathy (HCC) - The patient will be placed on supplemental coverage with NovoLog . - Will continue basal  coverage. - Will continue Neurontin . - Will hold off metformin.  Dyslipidemia - Will continue anti- hyperlipidemic therapy.  Anxiety and depression - Will continue Wellbutrin  XL and Xanax .   DVT prophylaxis: Lovenox.  Advanced Care Planning:  Code  Status: full code.  Family Communication:  The plan of care was discussed in details with the patient (and family). I answered all questions. The patient agreed to proceed with the above mentioned plan. Further management will depend upon hospital course. Disposition Plan: Back to previous home environment Consults called: none.  All the records are reviewed and case discussed with ED provider.  Status is: Inpatient  At the time of the admission, it appears that the appropriate admission status for this patient is inpatient.  This is judged to be reasonable and necessary in order to provide the required intensity of service to ensure the patient's safety given the presenting symptoms, physical exam findings and initial radiographic and laboratory data in the context of comorbid conditions.  The patient requires inpatient status due to high intensity of service, high risk of further deterioration and high frequency of surveillance required.  I certify that at the time of admission, it is my clinical judgment that the patient will require inpatient hospital care extending more than 2 midnights.                            Dispo: The patient is from: Home              Anticipated d/c is to: Home              Patient currently is not medically stable to d/c.              Difficult to place patient: No  [place] DELENA Peaches M.D on 08/04/2024 at 6:00 AM  [facility]   From 7 PM-7 AM, contact night-coverage [email or web address]  CC: Primary care physician; Shona Norleen PEDLAR, MD

## 2024-08-04 NOTE — Assessment & Plan Note (Signed)
-   We will continue methotrexate. 

## 2024-08-04 NOTE — Progress Notes (Signed)
 ASSUMPTION OF CARE NOTE   08/04/2024 4:25 PM  [name] was seen and examined.  The H&P by the admitting provider, orders, imaging was reviewed.  Please see new orders.  Will continue to follow.   Vitals:   08/04/24 1230 08/04/24 1347  BP: 131/63 138/76  Pulse: (!) 111 (!) 112  Resp: 18 18  Temp:  99.4 F (37.4 C)  SpO2: 96% 97%    Results for orders placed or performed during the hospital encounter of 08/03/24  Group A Strep by PCR   Collection Time: 08/03/24  9:38 PM   Specimen: Anterior Nasal Swab; Sterile Swab  Result Value Ref Range   Group A Strep by PCR NOT DETECTED NOT DETECTED  Resp panel by RT-PCR (RSV, Flu A&B, Covid) Anterior Nasal Swab   Collection Time: 08/03/24  9:38 PM   Specimen: Anterior Nasal Swab  Result Value Ref Range   SARS Coronavirus 2 by RT PCR NEGATIVE NEGATIVE   Influenza A by PCR NEGATIVE NEGATIVE   Influenza B by PCR NEGATIVE NEGATIVE   Resp Syncytial Virus by PCR NEGATIVE NEGATIVE  CBC with Differential   Collection Time: 08/03/24 10:02 PM  Result Value Ref Range   WBC 2.3 (L) 4.0 - 10.5 K/uL   RBC 3.16 (L) 3.87 - 5.11 MIL/uL   Hemoglobin 10.3 (L) 12.0 - 15.0 g/dL   HCT 69.7 (L) 63.9 - 53.9 %   MCV 95.6 80.0 - 100.0 fL   MCH 32.6 26.0 - 34.0 pg   MCHC 34.1 30.0 - 36.0 g/dL   RDW 86.6 88.4 - 84.4 %   Platelets 53 (L) 150 - 400 K/uL   nRBC 0.0 0.0 - 0.2 %   Neutrophils Relative % 84 %   Neutro Abs 1.9 1.7 - 7.7 K/uL   Lymphocytes Relative 12 %   Lymphs Abs 0.3 (L) 0.7 - 4.0 K/uL   Monocytes Relative 3 %   Monocytes Absolute 0.1 0.1 - 1.0 K/uL   Eosinophils Relative 0 %   Eosinophils Absolute 0.0 0.0 - 0.5 K/uL   Basophils Relative 0 %   Basophils Absolute 0.0 0.0 - 0.1 K/uL   Immature Granulocytes 1 %   Abs Immature Granulocytes 0.03 0.00 - 0.07 K/uL  Comprehensive metabolic panel   Collection Time: 08/03/24 10:02 PM  Result Value Ref Range   Sodium 132 (L) 135 - 145 mmol/L   Potassium 2.8 (L) 3.5 - 5.1 mmol/L   Chloride  95 (L) 98 - 111 mmol/L   CO2 25 22 - 32 mmol/L   Glucose, Bld 150 (H) 70 - 99 mg/dL   BUN 34 (H) 8 - 23 mg/dL   Creatinine, Ser 8.80 (H) 0.44 - 1.00 mg/dL   Calcium 8.7 (L) 8.9 - 10.3 mg/dL   Total Protein 7.0 6.5 - 8.1 g/dL   Albumin 3.3 (L) 3.5 - 5.0 g/dL   AST 65 (H) 15 - 41 U/L   ALT 27 0 - 44 U/L   Alkaline Phosphatase 46 38 - 126 U/L   Total Bilirubin 0.6 0.0 - 1.2 mg/dL   GFR, Estimated 49 (L) >60 mL/min   Anion gap 12 5 - 15  Lactic acid, plasma   Collection Time: 08/03/24 10:02 PM  Result Value Ref Range   Lactic Acid, Venous 1.5 0.5 - 1.9 mmol/L  Mononucleosis screen   Collection Time: 08/03/24 10:02 PM  Result Value Ref Range   Mono Screen NEGATIVE NEGATIVE  Blood culture (routine x 2)   Collection  Time: 08/03/24 11:57 PM   Specimen: Left Antecubital; Blood  Result Value Ref Range   Specimen Description LEFT ANTECUBITAL    Special Requests      BOTTLES DRAWN AEROBIC AND ANAEROBIC Blood Culture adequate volume   Culture      NO GROWTH < 12 HOURS Performed at [facility], [address], [place], [place] [address]    Report Status PENDING   Blood culture (routine x 2)   Collection Time: 08/04/24 12:01 AM   Specimen: BLOOD LEFT FOREARM  Result Value Ref Range   Specimen Description BLOOD LEFT FOREARM    Special Requests      BOTTLES DRAWN AEROBIC AND ANAEROBIC Blood Culture adequate volume   Culture      NO GROWTH < 12 HOURS Performed at [facility] [address] [place], [place] [address]    Report Status PENDING   Protime-INR   Collection Time: 08/04/24  6:21 AM  Result Value Ref Range   Prothrombin Time 15.4 (H) 11.4 - 15.2 seconds   INR 1.2 0.8 - 1.2  Cortisol-am, blood   Collection Time: 08/04/24  6:21 AM  Result Value Ref Range   Cortisol - AM 11.1 6.7 - 22.6 ug/dL  Basic metabolic panel   Collection Time: 08/04/24  6:21 AM  Result Value Ref Range   Sodium 133 (L) 135 - 145 mmol/L   Potassium 3.2 (L) 3.5 - 5.1 mmol/L   Chloride 104 98 - 111  mmol/L   CO2 22 22 - 32 mmol/L   Glucose, Bld 103 (H) 70 - 99 mg/dL   BUN 29 (H) 8 - 23 mg/dL   Creatinine, Ser 8.98 (H) 0.44 - 1.00 mg/dL   Calcium 7.9 (L) 8.9 - 10.3 mg/dL   GFR, Estimated >39 >39 mL/min   Anion gap 7 5 - 15  CBC   Collection Time: 08/04/24  6:21 AM  Result Value Ref Range   WBC 2.0 (L) 4.0 - 10.5 K/uL   RBC 2.61 (L) 3.87 - 5.11 MIL/uL   Hemoglobin 8.7 (L) 12.0 - 15.0 g/dL   HCT 74.3 (L) 63.9 - 53.9 %   MCV 98.1 80.0 - 100.0 fL   MCH 33.3 26.0 - 34.0 pg   MCHC 34.0 30.0 - 36.0 g/dL   RDW 86.2 88.4 - 84.4 %   Platelets 40 (L) 150 - 400 K/uL   nRBC 0.0 0.0 - 0.2 %  Procalcitonin   Collection Time: 08/04/24  6:21 AM  Result Value Ref Range   Procalcitonin 0.19 ng/mL  Hemoglobin A1c   Collection Time: 08/04/24  6:21 AM  Result Value Ref Range   Hgb A1c MFr Bld 5.6 4.8 - 5.6 %   Mean Plasma Glucose 114.02 mg/dL  CBG monitoring, ED   Collection Time: 08/04/24  7:48 AM  Result Value Ref Range   Glucose-Capillary 87 70 - 99 mg/dL  CBG monitoring, ED   Collection Time: 08/04/24 12:35 PM  Result Value Ref Range   Glucose-Capillary 109 (H) 70 - 99 mg/dL     KYM Louder, MD [facility]   08/03/2024  9:03 PM How to contact the [facility] Attending or Consulting provider 7A - 7P or covering provider during after hours 7P -7A, for this patient?  Check the care team in [facility] and look for a) attending/consulting [facility] provider listed and b) the [facility] team listed Log into [email or web address] and use [facility] universal password to access. If you do not have the password, please contact the hospital  operator. Locate the [facility] provider you are looking for under [facility] and page to a number that you can be directly reached. If you still have difficulty reaching the provider, please page the [facility] (Director on Call) for the Hospitalists listed on amion for assistance.

## 2024-08-04 NOTE — Assessment & Plan Note (Addendum)
-   The patient will be placed on supplemental coverage with NovoLog . - Will continue basal coverage. - Will continue Neurontin . - Will hold off metformin.

## 2024-08-04 NOTE — ED Provider Notes (Signed)
 Care of patient assumed from Dr. Zammit.  This patient presents with generalized weakness secondary to ongoing pain in mouth and throat.  She had low-grade temperature and tachycardia on arrival concerning for sepsis.  She has pancytopenia on her lab work.  She will require admission.  CT of neck is pending at this time. Physical Exam  BP 123/62   Pulse 100   Temp 100 F (37.8 C) (Oral)   Resp 16   Ht 5' (1.524 m)   Wt 56.2 kg   SpO2 93%   BMI 24.22 kg/m   Physical Exam Constitutional:      General: She is not in acute distress.    Appearance: She is normal weight. She is ill-appearing. She is not toxic-appearing.  HENT:     Head: Normocephalic and atraumatic.     Right Ear: External ear normal.     Left Ear: External ear normal.     Nose: Nose normal.     Mouth/Throat:     Comments: Sores on oral mucosa and tongue.  Oropharynx not able to be assessed. Eyes:     Extraocular Movements: Extraocular movements intact.  Cardiovascular:     Rate and Rhythm: Tachycardia present.  Pulmonary:     Effort: Pulmonary effort is normal.     Breath sounds: Normal breath sounds.  Abdominal:     General: Abdomen is flat.  Musculoskeletal:     Cervical back: Normal range of motion.  Skin:    General: Skin is warm and dry.     Capillary Refill: Capillary refill takes less than 2 seconds.     Coloration: Skin is not pale.  Neurological:     General: No focal deficit present.     Mental Status: She is alert and oriented to person, place, and time.  Psychiatric:        Mood and Affect: Mood normal.        Behavior: Behavior normal.     Procedures  Procedures  ED Course / MDM    Medical Decision Making Amount and/or Complexity of Data Reviewed Labs: ordered. Radiology: ordered.  Risk OTC drugs. Prescription drug management. Decision regarding hospitalization.   On assessment, patient resting on ED stretcher.  She describes ongoing pain in mouth and throat for the past 3  weeks.  She was reportedly prescribed acyclovir and Magic mouthwash with no relief.  Although oropharynx is not able to be directly inspected, she does have sores on oral mucosa.  This may be mucositis secondary to methotrexate .  Because of the mouth symptoms, she has had very little p.o. intake.  This would explain her hypokalemia.  Additional potassium and IV fluids were ordered.  CT of neck did not show any acute findings.  Patient was admitted for further management.       Melvenia Motto, MD 08/04/24 [phone]

## 2024-08-04 NOTE — Assessment & Plan Note (Signed)
-   Will continue Wellbutrin  XL and Xanax .

## 2024-08-04 NOTE — Assessment & Plan Note (Signed)
-   This is likely hypovolemic due to volume depletion and dehydration. - Management as above.

## 2024-08-05 DIAGNOSIS — L4059 Other psoriatic arthropathy: Secondary | ICD-10-CM

## 2024-08-05 DIAGNOSIS — A419 Sepsis, unspecified organism: Secondary | ICD-10-CM | POA: Diagnosis not present

## 2024-08-05 LAB — RESPIRATORY PANEL BY PCR

## 2024-08-05 LAB — BASIC METABOLIC PANEL WITH GFR
Anion gap: 5 (ref 5–15)
BUN: 16 mg/dL (ref 8–23)
CO2: 25 mmol/L (ref 22–32)
Calcium: 8.1 mg/dL — ABNORMAL LOW (ref 8.9–10.3)
Chloride: 106 mmol/L (ref 98–111)
Creatinine, Ser: 0.92 mg/dL (ref 0.44–1.00)
GFR, Estimated: >60 mL/min (ref >60)
Glucose, Bld: 89 mg/dL (ref 70–99)
Potassium: 2.9 mmol/L — ABNORMAL LOW (ref 3.5–5.1)
Sodium: 136 mmol/L (ref 135–145)

## 2024-08-05 LAB — DIFFERENTIAL
Abs Granulocyte: 1.2 K/uL — ABNORMAL LOW (ref 1.5–6.5)
Abs Immature Granulocytes: 0.01 K/uL (ref 0.00–0.07)
Basophils Absolute: 0 K/uL (ref 0.0–0.1)
Basophils Relative: 0 %
Eosinophils Absolute: 0.1 K/uL (ref 0.0–0.5)
Eosinophils Relative: 6 %
Immature Granulocytes: 0 %
Lymphocytes Relative: 32 %
Lymphs Abs: 0.7 K/uL (ref 0.7–4.0)
Monocytes Absolute: 0.1 K/uL (ref 0.1–1.0)
Monocytes Relative: 5 %
Neutro Abs: 1.2 K/uL — ABNORMAL LOW (ref 1.7–7.7)
Neutrophils Relative %: 57 %

## 2024-08-05 LAB — MONONUCLEOSIS SCREEN: Mono Screen: NEGATIVE

## 2024-08-05 LAB — FERRITIN: Ferritin: 234 ng/mL (ref 11–307)

## 2024-08-05 LAB — CBC
HCT: 24.1 % — ABNORMAL LOW (ref 36.0–46.0)
Hemoglobin: 8.3 g/dL — ABNORMAL LOW (ref 12.0–15.0)
MCH: 33.9 pg (ref 26.0–34.0)
MCHC: 34.4 g/dL (ref 30.0–36.0)
MCV: 98.4 fL (ref 80.0–100.0)
Platelets: 26 K/uL — CL (ref 150–400)
RBC: 2.45 MIL/uL — ABNORMAL LOW (ref 3.87–5.11)
RDW: 14.1 % (ref 11.5–15.5)
WBC: 2.3 K/uL — ABNORMAL LOW (ref 4.0–10.5)
nRBC: 0 % (ref 0.0–0.2)

## 2024-08-05 LAB — VITAMIN B12: Vitamin B-12: 515 pg/mL (ref 180–914)

## 2024-08-05 LAB — GLUCOSE, CAPILLARY
Glucose-Capillary: 79 mg/dL (ref 70–99)
Glucose-Capillary: 105 mg/dL — ABNORMAL HIGH (ref 70–99)
Glucose-Capillary: 98 mg/dL (ref 70–99)
Glucose-Capillary: 100 mg/dL — ABNORMAL HIGH (ref 70–99)

## 2024-08-05 LAB — RETICULOCYTES
Immature Retic Fract: 20.9 % — ABNORMAL HIGH (ref 2.3–15.9)
RBC.: 2.45 MIL/uL — ABNORMAL LOW (ref 3.87–5.11)
Retic Count, Absolute: 28.2 K/uL (ref 19.0–186.0)
Retic Ct Pct: 1.2 % (ref 0.4–3.1)

## 2024-08-05 LAB — IRON AND TIBC
Iron: 19 ug/dL — ABNORMAL LOW (ref 28–170)
Saturation Ratios: 12 % (ref 10.4–31.8)
TIBC: 158 ug/dL — ABNORMAL LOW (ref 250–450)
UIBC: 139 ug/dL

## 2024-08-05 LAB — MRSA NEXT GEN BY PCR, NASAL: MRSA by PCR Next Gen: NOT DETECTED

## 2024-08-05 LAB — PROCALCITONIN: Procalcitonin: 0.27 ng/mL

## 2024-08-05 LAB — LACTATE DEHYDROGENASE: LDH: 166 U/L (ref 98–192)

## 2024-08-05 LAB — FOLATE: Folate: 8.3 ng/mL (ref >5.9)

## 2024-08-05 MED ORDER — SODIUM CHLORIDE 0.9 % IV SOLN: INTRAVENOUS | Status: DC

## 2024-08-05 MED ORDER — FERROUS SULFATE 325 (65 FE) MG PO TABS
325.0000 mg | ORAL_TABLET | ORAL | Status: DC
  Administered 2024-08-06 – 2024-08-08 (×2): 325 mg via ORAL
  Filled 2024-08-05 (×3): qty 1

## 2024-08-05 MED ORDER — SODIUM CHLORIDE 0.9 % IV SOLN: INTRAVENOUS | Status: AC

## 2024-08-05 MED ORDER — HYDROMORPHONE HCL 1 MG/ML IJ SOLN
0.5000 mg | INTRAMUSCULAR | Status: DC | PRN
  Administered 2024-08-05 – 2024-08-09 (×11): 0.5 mg via INTRAVENOUS
  Filled 2024-08-05 (×11): qty 0.5

## 2024-08-05 MED ORDER — POTASSIUM CHLORIDE 10 MEQ/100ML IV SOLN
10.0000 meq | INTRAVENOUS | Status: AC
  Administered 2024-08-05 (×4): 10 meq via INTRAVENOUS
  Filled 2024-08-05: qty 100

## 2024-08-05 MED ORDER — CHLORHEXIDINE GLUCONATE 0.12 % MT SOLN
15.0000 mL | Freq: Four times a day (QID) | OROMUCOSAL | Status: DC
  Administered 2024-08-05 – 2024-08-10 (×19): 15 mL via OROMUCOSAL
  Filled 2024-08-05 (×18): qty 15

## 2024-08-05 NOTE — Progress Notes (Signed)
 PROGRESS NOTE   [name]  FMW:[id number] DOB: June 20, 1956 DOA: 08/03/2024 PCP: Shona Norleen PEDLAR, MD   Chief Complaint  Patient presents with   Fall   Level of care: Telemetry  Brief Admission History:  68 y.o. female with medical history significant for fibromyalgia, psoriatic arthritis on methotrexate , celiac disease, osteoarthritis and diverticulitis, who presented to the emergency room with acute onset of accidental mechanical fall.  She has been getting weaker lately and slipped off of her couch.  No head injuries.  She did not have any pain.  She has been having severe stool throat which has been treated with antibiotics without improvement.  No cough or wheezing or dyspnea.  No chest pain or palpitations.  No fever or chills.  No nausea or vomiting or abdominal pain.  No bleeding diathesis.  She has been dealing with sore throat and oral stomatitis for a few weeks and had seen a dentist Dr. Gerard and he took swabs of mouth for cultures and she saw her PCP Dr. Zack Hall's office and she was started on nystatin oral suspension but no significant improvement.   Pt says was bitten by a brown spider about 3 weeks ago and developed sores in mouth about 3-4 days prior to going to the dentist.     ED Course: When she came to the ER heart rate was 115 with temperature 100 and pulse ox O2 was 99% on 2 L of O2 by nasal cannula.  Labs revealed hyponatremia and hypochloremia with hypokalemia BUN of 34 with creatinine 1.19 previously normal with a calcium of 8.7 and a AST of 65, albumin 3.3 with total protein of 7.  Lactic acid was 1.5 and CBC showed leukopenia of 2.3 and anemia with hemoglobin 10.3 and hematocrit 30.2 previously normal however 9 years here.  Also showed thrombocytopenia 53.  Respiratory panel came back negative.  Group a strep PCR came back negative. EKG showed sinus tachycardia with a rate of 115 with Q waves in V1 and mild ST segment depression in leads lateral leads. Imaging: CT  soft tissue neck with contrast revealed no acute findings or discrete mass in the neck.  Portable chest x-ray showed no acute cardiopulmonary disease.   The patient was given IV vancomycin  and 1-1/2 L of IV normal saline bolus as well as 500 mg IV Zithromax  and 650 mg p.o. Tylenol .  She was admitted to a medical telemetry bed for further evaluation and management.   Assessment and Plan:  SIRS  - continue medical telemetry monitoring - continue hydration with IV normal saline. - she was initially started on broad-spectrum antibiotics due to pancytopenia but no infection found, stop antibiotics and follow clinically.  Procalcitonin and lactic acid levels are reassuring.   - Will follow blood cultures: no growth to date  - Hematology consulted  Pancytopenia -- cause unknown at this time  -- STOP methotrexate   -- appreciate hematology consult and recommendations -- could this be from brown spider bite? There is evidence that brown recluse spider bites can cause pancytopenia -- continue supportive measures as ordered -- anemia panel pending and I included vitamin B6 in case we consider ordering folinic acid treatment  Oral Stomatitis and mucositis -- I spoke with ENT on call and reviewed case, the recommendation was for supportive measures -- considered oral dexamethasone  mouth wash but we don't have available in our system, I checked with pharm D.   Hyponatremia - resolved - This was hypovolemic due to volume depletion and  dehydration. - Management as above.  AKI (acute kidney injury) - RESOLVED  - This was prerenal due to mild hypovolemia and dehydration. - The patient will be hydrated with IV normal saline with IV potassium chloride  and will follow BMP. - Will avoid nephrotoxins.  Hypokalemia - Potassium will be replaced and magnesium  level rechecked.  Type 2 diabetes mellitus with peripheral neuropathy  - The patient will be placed on supplemental coverage with NovoLog . - Will  continue basal coverage. - Will continue Neurontin . - Will hold off metformin. CBG (last 3)  Recent Labs    08/04/24 2000 08/05/24 0729 08/05/24 1116  GLUCAP 107* 79 105*    Dyslipidemia - Will continue anti- hyperlipidemic therapy.  Anxiety and depression - Will continue Wellbutrin  XL and Xanax .  Polyarticular psoriatic arthritis  -- methotrexate  was discontinued   DVT prophylaxis: SCDs Code Status: Full  Family Communication:  Disposition:    Consultants:  Hematology ENT Pharm D  Procedures:   Antimicrobials:    Subjective: Pt having severe sore throat symptoms and barely able to eat   Objective: Vitals:   08/05/24 0005 08/05/24 0332 08/05/24 0730 08/05/24 1313  BP: 114/64 (!) 129/58 119/64 (!) 152/65  Pulse: 94 (!) 110 (!) 105 (!) 120  Resp: 18 18    Temp: 98.5 F (36.9 C) 98.6 F (37 C) 98.3 F (36.8 C) 100.1 F (37.8 C)  TempSrc: Axillary Axillary Oral Oral  SpO2: 96% 95% 92% 97%  Weight:      Height:        Intake/Output Summary (Last 24 hours) at 08/05/2024 1454 Last data filed at 08/05/2024 0041 Gross per 24 hour  Intake 1189.5 ml  Output --  Net 1189.5 ml   Filed Weights   08/03/24 2108 08/04/24 1400  Weight: 56.2 kg 63.7 kg   Examination:  General exam: Appears calm and Uncomfortable  ENT: diffuse stomatitis/mucositis in mouth and throat Respiratory system: Clear to auscultation. Respiratory effort normal. Cardiovascular system: normal S1 & S2 heard. No JVD, murmurs, rubs, gallops or clicks. No pedal edema. Gastrointestinal system: Abdomen is nondistended, soft and nontender. No organomegaly or masses felt. Normal bowel sounds heard. Central nervous system: Alert and oriented. No focal neurological deficits. Extremities: Symmetric 5 x 5 power. Skin: No rashes, lesions or ulcers. Psychiatry: Judgement and insight appear normal. Mood & affect appropriate.   Data Reviewed: I have personally reviewed following labs and imaging  studies  CBC: Recent Labs  Lab 08/03/24 2202 08/04/24 0621 08/05/24 0537  WBC 2.3* 2.0* 2.3*  NEUTROABS 1.9  --  1.2*  HGB 10.3* 8.7* 8.3*  HCT 30.2* 25.6* 24.1*  MCV 95.6 98.1 98.4  PLT 53* 40* 26*    Basic Metabolic Panel: Recent Labs  Lab 08/03/24 2202 08/04/24 0621 08/05/24 0537  NA 132* 133* 136  K 2.8* 3.2* 2.9*  CL 95* 104 106  CO2 25 22 25   GLUCOSE 150* 103* 89  BUN 34* 29* 16  CREATININE 1.19* 1.01* 0.92  CALCIUM 8.7* 7.9* 8.1*    CBG: Recent Labs  Lab 08/04/24 1235 08/04/24 1628 08/04/24 2000 08/05/24 0729 08/05/24 1116  GLUCAP 109* 94 107* 79 105*    Recent Results (from the past 240 hours)  Group A Strep by PCR     Status: None   Collection Time: 08/03/24  9:38 PM   Specimen: Anterior Nasal Swab; Sterile Swab  Result Value Ref Range Status   Group A Strep by PCR NOT DETECTED NOT DETECTED Final  Comment: Performed at [facility], [address], [place], [place] [address]  Resp panel by RT-PCR (RSV, Flu A&B, Covid) Anterior Nasal Swab     Status: None   Collection Time: 08/03/24  9:38 PM   Specimen: Anterior Nasal Swab  Result Value Ref Range Status   SARS Coronavirus 2 by RT PCR NEGATIVE NEGATIVE Final    Comment: (NOTE) SARS-CoV-2 target nucleic acids are NOT DETECTED.  The SARS-CoV-2 RNA is generally detectable in upper respiratory specimens during the acute phase of infection. The lowest concentration of SARS-CoV-2 viral copies this assay can detect is 138 copies/mL. A negative result does not preclude SARS-Cov-2 infection and should not be used as the sole basis for treatment or other patient management decisions. A negative result may occur with  improper specimen collection/handling, submission of specimen other than nasopharyngeal swab, presence of viral mutation(s) within the areas targeted by this assay, and inadequate number of viral copies(<138 copies/mL). A negative result must be combined with clinical observations,  patient history, and epidemiological information. The expected result is Negative.  Fact Sheet for Patients:  BloggerCourse.com  Fact Sheet for Healthcare Providers:  SeriousBroker.it  This test is no t yet approved or cleared by the [place]  FDA and  has been authorized for detection and/or diagnosis of SARS-CoV-2 by FDA under an Emergency Use Authorization (EUA). This EUA will remain  in effect (meaning this test can be used) for the duration of the COVID-19 declaration under Section 564(b)(1) of the Act, 21 U.S.C.section 360bbb-3(b)(1), unless the authorization is terminated  or revoked sooner.       Influenza A by PCR NEGATIVE NEGATIVE Final   Influenza B by PCR NEGATIVE NEGATIVE Final    Comment: (NOTE) The Xpert Xpress SARS-CoV-2/FLU/RSV plus assay is intended as an aid in the diagnosis of influenza from Nasopharyngeal swab specimens and should not be used as a sole basis for treatment. Nasal washings and aspirates are unacceptable for Xpert Xpress SARS-CoV-2/FLU/RSV testing.  Fact Sheet for Patients: BloggerCourse.com  Fact Sheet for Healthcare Providers: SeriousBroker.it  This test is not yet approved or cleared by the [place]  FDA and has been authorized for detection and/or diagnosis of SARS-CoV-2 by FDA under an Emergency Use Authorization (EUA). This EUA will remain in effect (meaning this test can be used) for the duration of the COVID-19 declaration under Section 564(b)(1) of the Act, 21 U.S.C. section 360bbb-3(b)(1), unless the authorization is terminated or revoked.     Resp Syncytial Virus by PCR NEGATIVE NEGATIVE Final    Comment: (NOTE) Fact Sheet for Patients: BloggerCourse.com  Fact Sheet for Healthcare Providers: SeriousBroker.it  This test is not yet approved or cleared by the [place] FDA and has been authorized for detection and/or diagnosis of SARS-CoV-2 by FDA under an Emergency Use Authorization (EUA). This EUA will remain in effect (meaning this test can be used) for the duration of the COVID-19 declaration under Section 564(b)(1) of the Act, 21 U.S.C. section 360bbb-3(b)(1), unless the authorization is terminated or revoked.  Performed at [facility], [address] [place], [place] [address]   Blood culture (routine x 2)     Status: None (Preliminary result)   Collection Time: 08/03/24 11:57 PM   Specimen: Left Antecubital; Blood  Result Value Ref Range Status   Specimen Description LEFT ANTECUBITAL  Final   Special Requests   Final    BOTTLES DRAWN AEROBIC AND ANAEROBIC Blood Culture adequate volume   Culture   Final  NO GROWTH 1 DAY Performed at [facility], [address], [place], [place] [address]    Report Status PENDING  Incomplete  Blood culture (routine x 2)     Status: None (Preliminary result)   Collection Time: 08/04/24 12:01 AM   Specimen: BLOOD LEFT FOREARM  Result Value Ref Range Status   Specimen Description BLOOD LEFT FOREARM  Final   Special Requests   Final    BOTTLES DRAWN AEROBIC AND ANAEROBIC Blood Culture adequate volume   Culture   Final    NO GROWTH 1 DAY Performed at [facility], [address] [place], [place] [address]    Report Status PENDING  Incomplete  MRSA Next Gen by PCR, Nasal     Status: None   Collection Time: 08/05/24 11:32 AM   Specimen: Nasal Mucosa; Nasal Swab  Result Value Ref Range Status   MRSA by PCR Next Gen NOT DETECTED NOT DETECTED Final    Comment: (NOTE) The GeneXpert MRSA Assay (FDA approved for NASAL specimens only), is one component of a comprehensive MRSA colonization surveillance program. It is not intended to diagnose MRSA infection nor to guide or monitor treatment for MRSA infections. Test performance is not FDA approved in patients less than 32 years old. Performed at [facility], [address], [place], [place] [address]      Radiology Studies: CT Soft Tissue Neck W Contrast Result Date: 08/03/2024 EXAM: CT NECK WITH CONTRAST 08/03/2024 11:06:11 PM TECHNIQUE: CT of the neck was performed with the administration of intravenous contrast. Multiplanar reformatted images are provided for review. Automated exposure control, iterative reconstruction, and/or weight based adjustment of the mA/kV was utilized to reduce the radiation dose to as low as reasonably achievable. CONTRAST: 75mL iohexol  (OMNIPAQUE ) 300 MG/ML solution COMPARISON: None available. CLINICAL HISTORY: Soft tissue infection suspected, neck, xray done. Pt. Clemens today and also complains of a current mouth infection. FINDINGS: AERODIGESTIVE TRACT: No discrete mass. No edema. SALIVARY GLANDS: The parotid and submandibular glands are unremarkable. THYROID : Unremarkable. LYMPH NODES: No suspicious cervical lymphadenopathy. SOFT TISSUES: No mass or fluid collection. BRAIN, ORBITS, SINUSES AND MASTOIDS: No acute abnormality. LUNGS AND MEDIASTINUM: No acute abnormality. BONES: No focal bone abnormality. VASCULATURE: Calcific atherosclerosis of the aortic arch and carotid bifurcations. SPINE: Moderate spinal canal stenosis at the C5-6 level. IMPRESSION: 1. No acute findings or discrete mass in the neck. Electronically signed by: Franky Stanford MD 08/03/2024 11:34 PM EDT RP Workstation: [id number]   DG Chest Port 1 View Result Date: 08/03/2024 CLINICAL DATA:  Shortness of breath EXAM: PORTABLE CHEST 1 VIEW COMPARISON:  12/26/2006 FINDINGS: Heart and mediastinal contours are within normal limits. No focal opacities or effusions. No acute bony abnormality. Aortic atherosclerosis. IMPRESSION: No active cardiopulmonary disease. Electronically Signed   By: Franky Crease M.D.   On: 08/03/2024 22:03    Scheduled Meds:  chlorhexidine   15 mL Mouth/Throat QID   folic acid   3 mg Oral Daily   gabapentin   300 mg Oral TID   insulin   aspart  0-9 Units Subcutaneous TID WC   loratadine   10 mg Oral QHS   magnesium  gluconate  500 mg Oral Daily   Plecanatide   3 mg Oral Daily   pyridOXINE   100 mg Oral Daily   QUEtiapine   100 mg Oral QHS   thiamine   100 mg Oral Daily   vitamin B-12  100 mcg Oral Daily   Continuous Infusions:  sodium chloride  70 mL/hr at 08/05/24 1043     LOS: 1  day   Time spent: 66 mins  [place] Louder, MD How to contact the [facility] Attending or Consulting provider 7A - 7P or covering provider during after hours 7P -7A, for this patient?  Check the care team in [facility] and look for a) attending/consulting [facility] provider listed and b) the [facility] team listed Log into [email or web address] to find provider on call.  Locate the [facility] provider you are looking for under [facility] and page to a number that you can be directly reached. If you still have difficulty reaching the provider, please page the [facility] (Director on Call) for the Hospitalists listed on amion for assistance.  08/05/2024, 2:54 PM

## 2024-08-05 NOTE — Plan of Care (Signed)

## 2024-08-05 NOTE — Progress Notes (Signed)
 Mobility Specialist Progress Note:    08/05/24 1313  Therapy Vitals  Temp 100.1 F (37.8 C)  Temp Source Oral  Pulse Rate (!) 120  BP (!) 152/65  Patient Position (if appropriate) Lying  Oxygen Therapy  SpO2 97 %  O2 Device Room Air  Mobility  Activity Ambulated with assistance  Level of Assistance Modified independent, requires aide device or extra time  Assistive Device Front wheel walker  Distance Ambulated (ft) 25 ft  Range of Motion/Exercises Active;All extremities  Activity Response Tolerated well  Mobility Referral Yes  Mobility visit 1 Mobility  Mobility Specialist Start Time (ACUTE ONLY) 1300  Mobility Specialist Stop Time (ACUTE ONLY) 1313  Mobility Specialist Time Calculation (min) (ACUTE ONLY) 13 min   Pt received requesting assistance. ModI to stand and ambulate with RW. Tolerated well, asx throughout. Left supine, alarm on. All needs met.  Sherrilee Ditty Mobility Specialist Please contact via Special educational needs teacher or  Rehab office at [phone]

## 2024-08-05 NOTE — Progress Notes (Signed)
 Critical Platelet of 26 reported to Dr Lawence

## 2024-08-05 NOTE — Plan of Care (Signed)
  Problem: Fluid Volume: Goal: Hemodynamic stability will improve Outcome: Progressing   Problem: Clinical Measurements: Goal: Diagnostic test results will improve Outcome: Progressing   Problem: Fluid Volume: Goal: Ability to maintain a balanced intake and output will improve Outcome: Progressing   Problem: Health Behavior/Discharge Planning: Goal: Ability to identify and utilize available resources and services will improve Outcome: Progressing   Problem: Metabolic: Goal: Ability to maintain appropriate glucose levels will improve Outcome: Progressing   Problem: Nutritional: Goal: Maintenance of adequate nutrition will improve Outcome: Progressing

## 2024-08-05 NOTE — Hospital Course (Signed)
 68 y.o. female with medical history significant for fibromyalgia, psoriatic arthritis on methotrexate , celiac disease, osteoarthritis and diverticulitis, who presented to the emergency room with acute onset of accidental mechanical fall.  She has been getting weaker lately and slipped off of her couch.  No head injuries.  She did not have any pain.  She states that she was bitten by a spider on 07/06/2024.  Patient states that she initially saw her dentist on 07/09/2024, and the patient was placed on acyclovir for sores in her mouth.  No cough or wheezing or dyspnea.  No chest pain or palpitations.  No fever or chills.  No nausea or vomiting or abdominal pain.  No bleeding diathesis.  She has been dealing with sore throat and oral stomatitis for a few weeks and had seen a dentist Dr. Gerard and he took swabs of mouth for cultures and she saw her PCP Dr. Zack Hall's office and she was started on nystatin oral suspension but no significant improvement.  She denies any recent travels.  She denies any exotic pets.  She has not been camping, hiking, fishing.  She denies any new medications.  She has not had any recent sick contacts.  She has not traveled outside [place]  for the last several months. Review of the medical record shows that on 06/19/2024, WBC 5.4, hemoglobin 12.3, platelets 221.  LFTs were normal at that time.  In addition, her CBC on 01/26/2024 showed WBC 5.4, hemoglobin 14.1, platelet 223.  She has been on methotrexate  since 01/2020. Since admission, she states that her sore throat has somewhat improved but she still has sores in her mouth.   ED Course: When she came to the ER heart rate was 115 with temperature 100 and pulse ox O2 was 99% on 2 L of O2 by nasal cannula.  Labs revealed hyponatremia and hypochloremia with hypokalemia BUN of 34 with creatinine 1.19 previously normal with a calcium of 8.7 and a AST of 65, albumin 3.3 with total protein of 7.  Lactic acid was 1.5 and CBC showed leukopenia  of 2.3 and anemia with hemoglobin 10.3 and hematocrit 30.2 previously normal however 9 years here.  Also showed thrombocytopenia 53.  Respiratory panel came back negative.  Group a strep PCR came back negative. EKG showed sinus tachycardia with a rate of 115 with Q waves in V1 and mild ST segment depression in leads lateral leads. Imaging: CT soft tissue neck with contrast revealed no acute findings or discrete mass in the neck.  Portable chest x-ray showed no acute cardiopulmonary disease.   The patient was given IV vancomycin  and 1-1/2 L of IV normal saline bolus as well as 500 mg IV Zithromax  and 650 mg p.o. Tylenol .  She was admitted to a medical telemetry bed for further evaluation and management.

## 2024-08-05 NOTE — Progress Notes (Signed)
 [facility] Consultation Oncology  Name: [name]      MRN: [id number]    Location: J663/[id number]  Date: 08/05/2024 Time:4:21 PM   REFERRING PHYSICIAN:  Dr.Johnson  REASON FOR CONSULT: Pancytopenia  HISTORY OF PRESENT ILLNESS:   Patient is a 68 year old female with past medical history of fibromyalgia, Psoriatic arthritis on methotrexate , celiac disease, osteoarthritis and diverticulitis presenting to the emergency room with mechanical fall.    Patient reported that she has been feeling tired and slipped off the couch.  Patient was found to have pancytopenia on labs and already was consulted for the same.  Patient reported that she had a spider bite almost 1 month ago and soon after that she had multiple oral blisters that developed.  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  Patient was given nystatin which she only used for a few days now.  Patient had difficulty eating and swallowing and appetite was not able to eat a lot.  The blisters seem to be improving from when it initially began.  She denies fever, chills, nausea, vomiting, abdominal pain, night sweats.  Patient has no other complaints at this time.   PAST MEDICAL HISTORY:   Past Medical History:  Diagnosis Date   Arthritis    Celiac disease    Diverticulitis    Fibromyalgia    Osteoarthritis (arthritis due to wear and tear of joints)    Renal disorder     ALLERGIES: Allergies  Allergen Reactions   Cefazolin Anaphylaxis    ANCEF   Gluten Meal Other (See Comments)    Stomach pain, bloating   Cinnamon Other (See Comments)    Nasal congestion   Codeine Nausea Only   Meperidine Hcl Nausea Only   Milk Protein Other (See Comments)    Lactose intolerant  bloating   Morphine Nausea Only   Penicillamine    Penicillins Hives   Sulfonamide Derivatives Other (See Comments)    REACTION IS UNKNOWN   Sulfur Other (See Comments)      MEDICATIONS: I have  reviewed the patient's current medications.     PAST SURGICAL HISTORY Past Surgical History:  Procedure Laterality Date   APPENDECTOMY     BACK SURGERY     Patient states that she has had 5 surgeries   COLONOSCOPY     2011   NECK SURGERY     Cerivcal Fusion   NECK SURGERY     REDUCTION MAMMAPLASTY Bilateral    TONSILLECTOMY AND ADENOIDECTOMY     UPPER GASTROINTESTINAL ENDOSCOPY  2011    FAMILY HISTORY: Family History  Problem Relation Age of Onset   Heart disease Mother     SOCIAL HISTORY:  reports that she has quit smoking. Her smoking use included cigarettes. She started smoking about 12 years ago. She has never used smokeless tobacco. She reports that she does not drink alcohol and does not use drugs.  PERFORMANCE STATUS: The patient's performance status is 0 - Asymptomatic  PHYSICAL EXAM: Most Recent Vital Signs: Blood pressure 128/69, pulse (!) 107, temperature 98.9 F (37.2 C), temperature source Oral, resp. rate 18, height 5' (1.524 m), weight 140 lb 6.9 oz (63.7 kg), SpO2 94%.  GENERAL:alert, no distress and comfortable SKIN: Healed bite mark from a spider bite on her right arm EYES: normal, conjunctiva are pink and non-injected, sclera clear OROPHARYNX: Oral mucositis more related tongue inside lower lip.  Some signs of prior bleeding.  No current bleeding.  No signs of thrush NECK: supple, thyroid  normal size, non-tender, without nodularity LYMPH:  no palpable lymphadenopathy in the cervical, axillary or inguinal LUNGS: clear to auscultation and percussion with normal breathing effort HEART: regular rate & rhythm and no murmurs and no lower extremity edema ABDOMEN:abdomen soft, non-tender and normal bowel sounds Musculoskeletal:no cyanosis of digits and no clubbing  PSYCH: alert & oriented x 3 with fluent speech NEURO: no focal motor/sensory deficits  LABORATORY DATA:  Results for orders placed or performed during the hospital encounter of 08/03/24 (from the  past 48 hours)  Group A Strep by PCR     Status: None   Collection Time: 08/03/24  9:38 PM   Specimen: Anterior Nasal Swab; Sterile Swab  Result Value Ref Range   Group A Strep by PCR NOT DETECTED NOT DETECTED    Comment: Performed at [facility], [address], [place], [place] [address]  Resp panel by RT-PCR (RSV, Flu A&B, Covid) Anterior Nasal Swab     Status: None   Collection Time: 08/03/24  9:38 PM   Specimen: Anterior Nasal Swab  Result Value Ref Range   SARS Coronavirus 2 by RT PCR NEGATIVE NEGATIVE    Comment: (NOTE) SARS-CoV-2 target nucleic acids are NOT DETECTED.  The SARS-CoV-2 RNA is generally detectable in upper respiratory specimens during the acute phase of infection. The lowest concentration of SARS-CoV-2 viral copies this assay can detect is 138 copies/mL. A negative result does not preclude SARS-Cov-2 infection and should not be used as the sole basis for treatment or other patient management decisions. A negative result may occur with  improper specimen collection/handling, submission of specimen other than nasopharyngeal swab, presence of viral mutation(s) within the areas targeted by this assay, and inadequate number of viral copies(<138 copies/mL). A negative result must be combined with clinical observations, patient history, and epidemiological information. The expected result is Negative.  Fact Sheet for Patients:  BloggerCourse.com  Fact Sheet for Healthcare Providers:  SeriousBroker.it  This test is no t yet approved or cleared by the [place]  FDA and  has been authorized for detection and/or diagnosis of SARS-CoV-2 by FDA under an Emergency Use Authorization (EUA). This EUA will remain  in effect (meaning this test can be used) for the duration of the COVID-19 declaration under Section 564(b)(1) of the Act, 21 U.S.C.section 360bbb-3(b)(1), unless the authorization is terminated  or revoked  sooner.       Influenza A by PCR NEGATIVE NEGATIVE   Influenza B by PCR NEGATIVE NEGATIVE    Comment: (NOTE) The Xpert Xpress SARS-CoV-2/FLU/RSV plus assay is intended as an aid in the diagnosis of influenza from Nasopharyngeal swab specimens and should not be used as a sole basis for treatment. Nasal washings and aspirates are unacceptable for Xpert Xpress SARS-CoV-2/FLU/RSV testing.  Fact Sheet for Patients: BloggerCourse.com  Fact Sheet for Healthcare Providers: SeriousBroker.it  This test is not yet approved or cleared by the [place]  FDA and has been authorized for detection and/or diagnosis of SARS-CoV-2 by FDA under an Emergency Use Authorization (EUA). This EUA will remain in effect (meaning this test can be used) for the duration of the COVID-19 declaration under Section 564(b)(1) of the Act, 21 U.S.C. section 360bbb-3(b)(1), unless the authorization is terminated or revoked.     Resp Syncytial Virus by PCR NEGATIVE NEGATIVE    Comment: (NOTE) Fact Sheet for Patients: BloggerCourse.com  Fact Sheet for Healthcare Providers: SeriousBroker.it  This test is not yet approved or  cleared by the [place]  FDA and has been authorized for detection and/or diagnosis of SARS-CoV-2 by FDA under an Emergency Use Authorization (EUA). This EUA will remain in effect (meaning this test can be used) for the duration of the COVID-19 declaration under Section 564(b)(1) of the Act, 21 U.S.C. section 360bbb-3(b)(1), unless the authorization is terminated or revoked.  Performed at [facility], [address] [place], [place] [address]   CBC with Differential     Status: Abnormal   Collection Time: 08/03/24 10:02 PM  Result Value Ref Range   WBC 2.3 (L) 4.0 - 10.5 K/uL   RBC 3.16 (L) 3.87 - 5.11 MIL/uL   Hemoglobin 10.3 (L) 12.0 - 15.0 g/dL   HCT 69.7 (L) 63.9 - 53.9 %    MCV 95.6 80.0 - 100.0 fL   MCH 32.6 26.0 - 34.0 pg   MCHC 34.1 30.0 - 36.0 g/dL   RDW 86.6 88.4 - 84.4 %   Platelets 53 (L) 150 - 400 K/uL    Comment: SPECIMEN CHECKED FOR CLOTS REPEATED TO VERIFY Immature Platelet Fraction may be clinically indicated, consider ordering this additional test [id number]    nRBC 0.0 0.0 - 0.2 %   Neutrophils Relative % 84 %   Neutro Abs 1.9 1.7 - 7.7 K/uL   Lymphocytes Relative 12 %   Lymphs Abs 0.3 (L) 0.7 - 4.0 K/uL   Monocytes Relative 3 %   Monocytes Absolute 0.1 0.1 - 1.0 K/uL   Eosinophils Relative 0 %   Eosinophils Absolute 0.0 0.0 - 0.5 K/uL   Basophils Relative 0 %   Basophils Absolute 0.0 0.0 - 0.1 K/uL   Immature Granulocytes 1 %   Abs Immature Granulocytes 0.03 0.00 - 0.07 K/uL    Comment: Performed at [facility], [address] [place], [place] [address]  Comprehensive metabolic panel     Status: Abnormal   Collection Time: 08/03/24 10:02 PM  Result Value Ref Range   Sodium 132 (L) 135 - 145 mmol/L   Potassium 2.8 (L) 3.5 - 5.1 mmol/L   Chloride 95 (L) 98 - 111 mmol/L   CO2 25 22 - 32 mmol/L   Glucose, Bld 150 (H) 70 - 99 mg/dL    Comment: Glucose reference range applies only to samples taken after fasting for at least 8 hours.   BUN 34 (H) 8 - 23 mg/dL   Creatinine, Ser 8.80 (H) 0.44 - 1.00 mg/dL   Calcium 8.7 (L) 8.9 - 10.3 mg/dL   Total Protein 7.0 6.5 - 8.1 g/dL   Albumin 3.3 (L) 3.5 - 5.0 g/dL   AST 65 (H) 15 - 41 U/L   ALT 27 0 - 44 U/L   Alkaline Phosphatase 46 38 - 126 U/L   Total Bilirubin 0.6 0.0 - 1.2 mg/dL   GFR, Estimated 49 (L) >60 mL/min    Comment: (NOTE) Calculated using the CKD-EPI Creatinine Equation (2021)    Anion gap 12 5 - 15    Comment: Performed at [facility], [address], [place], [place] [address]  Lactic acid, plasma     Status: None   Collection Time: 08/03/24 10:02 PM  Result Value Ref Range   Lactic Acid, Venous 1.5 0.5 - 1.9 mmol/L    Comment: Performed at [facility], [address], [place], [place] [address]  Mononucleosis screen     Status: None   Collection Time: 08/03/24 10:02 PM  Result Value Ref Range   Mono Screen NEGATIVE NEGATIVE  Comment: Performed at [facility], [address] [place], [place] [address]  Blood culture (routine x 2)     Status: None (Preliminary result)   Collection Time: 08/03/24 11:57 PM   Specimen: Left Antecubital; Blood  Result Value Ref Range   Specimen Description LEFT ANTECUBITAL    Special Requests      BOTTLES DRAWN AEROBIC AND ANAEROBIC Blood Culture adequate volume   Culture      NO GROWTH 1 DAY Performed at [facility], [address] [place], [place] [address]    Report Status PENDING   Blood culture (routine x 2)     Status: None (Preliminary result)   Collection Time: 08/04/24 12:01 AM   Specimen: BLOOD LEFT FOREARM  Result Value Ref Range   Specimen Description BLOOD LEFT FOREARM    Special Requests      BOTTLES DRAWN AEROBIC AND ANAEROBIC Blood Culture adequate volume   Culture      NO GROWTH 1 DAY Performed at [facility] [address], [place], [place] [address]    Report Status PENDING   Protime-INR     Status: Abnormal   Collection Time: 08/04/24  6:21 AM  Result Value Ref Range   Prothrombin Time 15.4 (H) 11.4 - 15.2 seconds   INR 1.2 0.8 - 1.2    Comment: (NOTE) INR goal varies based on device and disease states. Performed at [facility], [address] [place], [place] [address]   Cortisol-am, blood     Status: None   Collection Time: 08/04/24  6:21 AM  Result Value Ref Range   Cortisol - AM 11.1 6.7 - 22.6 ug/dL    Comment: Performed at [facility], [address] [place], [place] [address]  Basic metabolic panel     Status: Abnormal   Collection Time: 08/04/24  6:21 AM  Result Value Ref Range   Sodium 133 (L) 135 - 145 mmol/L   Potassium 3.2 (L) 3.5 - 5.1 mmol/L   Chloride 104 98 - 111 mmol/L   CO2 22 22 - 32 mmol/L   Glucose, Bld 103 (H) 70 - 99 mg/dL    Comment:  Glucose reference range applies only to samples taken after fasting for at least 8 hours.   BUN 29 (H) 8 - 23 mg/dL   Creatinine, Ser 8.98 (H) 0.44 - 1.00 mg/dL   Calcium 7.9 (L) 8.9 - 10.3 mg/dL   GFR, Estimated >39 >39 mL/min    Comment: (NOTE) Calculated using the CKD-EPI Creatinine Equation (2021)    Anion gap 7 5 - 15    Comment: Performed at [facility], [address], [place], [place] [address]  CBC     Status: Abnormal   Collection Time: 08/04/24  6:21 AM  Result Value Ref Range   WBC 2.0 (L) 4.0 - 10.5 K/uL   RBC 2.61 (L) 3.87 - 5.11 MIL/uL   Hemoglobin 8.7 (L) 12.0 - 15.0 g/dL   HCT 74.3 (L) 63.9 - 53.9 %   MCV 98.1 80.0 - 100.0 fL   MCH 33.3 26.0 - 34.0 pg   MCHC 34.0 30.0 - 36.0 g/dL   RDW 86.2 88.4 - 84.4 %   Platelets 40 (L) 150 - 400 K/uL    Comment: SPECIMEN CHECKED FOR CLOTS CONSISTENT WITH PREVIOUS RESULT REPEATED TO VERIFY Immature Platelet Fraction may be clinically indicated, consider ordering this additional test [id number]    nRBC 0.0 0.0 - 0.2 %    Comment: Performed at [facility], [address], [place], [place] [address]  HIV Antibody (routine testing w rflx)     Status: None   Collection Time: 08/04/24  6:21 AM  Result Value Ref Range   HIV Screen 4th Generation wRfx Non Reactive Non Reactive    Comment: Performed at [facility], [address] [place], [place] [address]  Procalcitonin     Status: None   Collection Time: 08/04/24  6:21 AM  Result Value Ref Range   Procalcitonin 0.19 ng/mL    Comment:        Interpretation: PCT (Procalcitonin) <= 0.5 ng/mL: Systemic infection (sepsis) is not likely. Local bacterial infection is possible. (NOTE)       Sepsis PCT Algorithm           Lower Respiratory Tract                                      Infection PCT Algorithm    ----------------------------     ----------------------------         PCT < 0.25 ng/mL                PCT < 0.10 ng/mL          Strongly encourage              Strongly discourage   discontinuation of antibiotics    initiation of antibiotics    ----------------------------     -----------------------------       PCT 0.25 - 0.50 ng/mL            PCT 0.10 - 0.25 ng/mL               OR       >80% decrease in PCT            Discourage initiation of                                            antibiotics      Encourage discontinuation           of antibiotics    ----------------------------     -----------------------------         PCT >= 0.50 ng/mL              PCT 0.26 - 0.50 ng/mL               AND        <80% decrease in PCT             Encourage initiation of                                             antibiotics       Encourage continuation           of antibiotics    ----------------------------     -----------------------------        PCT >= 0.50 ng/mL                  PCT > 0.50 ng/mL               AND  increase in PCT                  Strongly encourage                                      initiation of antibiotics    Strongly encourage escalation           of antibiotics                                     -----------------------------                                           PCT <= 0.25 ng/mL                                                 OR                                        > 80% decrease in PCT                                      Discontinue / Do not initiate                                             antibiotics  Performed at [facility], [address] [place], [place] [address]   Hemoglobin A1c     Status: None   Collection Time: 08/04/24  6:21 AM  Result Value Ref Range   Hgb A1c MFr Bld 5.6 4.8 - 5.6 %    Comment: (NOTE) Diagnosis of Diabetes The following HbA1c ranges recommended by the American Diabetes Association (ADA) may be used as an aid in the diagnosis of diabetes mellitus.  Hemoglobin             Suggested A1C NGSP%              Diagnosis  <5.7                   Non Diabetic  5.7-6.4                 Pre-Diabetic  >6.4                   Diabetic  <7.0                   Glycemic control for                       adults with diabetes.     Mean Plasma Glucose 114.02 mg/dL    Comment: Performed at [facility], [address], [place], [place] [address]  CBG monitoring, ED     Status: None   Collection  Time: 08/04/24  7:48 AM  Result Value Ref Range   Glucose-Capillary 87 70 - 99 mg/dL    Comment: Glucose reference range applies only to samples taken after fasting for at least 8 hours.  CBG monitoring, ED     Status: Abnormal   Collection Time: 08/04/24 12:35 PM  Result Value Ref Range   Glucose-Capillary 109 (H) 70 - 99 mg/dL    Comment: Glucose reference range applies only to samples taken after fasting for at least 8 hours.  Glucose, capillary     Status: None   Collection Time: 08/04/24  4:28 PM  Result Value Ref Range   Glucose-Capillary 94 70 - 99 mg/dL    Comment: Glucose reference range applies only to samples taken after fasting for at least 8 hours.  Glucose, capillary     Status: Abnormal   Collection Time: 08/04/24  8:00 PM  Result Value Ref Range   Glucose-Capillary 107 (H) 70 - 99 mg/dL    Comment: Glucose reference range applies only to samples taken after fasting for at least 8 hours.  Basic metabolic panel with GFR     Status: Abnormal   Collection Time: 08/05/24  5:37 AM  Result Value Ref Range   Sodium 136 135 - 145 mmol/L   Potassium 2.9 (L) 3.5 - 5.1 mmol/L   Chloride 106 98 - 111 mmol/L   CO2 25 22 - 32 mmol/L   Glucose, Bld 89 70 - 99 mg/dL    Comment: Glucose reference range applies only to samples taken after fasting for at least 8 hours.   BUN 16 8 - 23 mg/dL   Creatinine, Ser 9.07 0.44 - 1.00 mg/dL   Calcium 8.1 (L) 8.9 - 10.3 mg/dL   GFR, Estimated >39 >39 mL/min    Comment: (NOTE) Calculated using the CKD-EPI Creatinine Equation (2021)    Anion gap 5 5 - 15    Comment: Performed at [facility], [address],  [place], [place] [address]  Procalcitonin     Status: None   Collection Time: 08/05/24  5:37 AM  Result Value Ref Range   Procalcitonin 0.27 ng/mL    Comment:        Interpretation: PCT (Procalcitonin) <= 0.5 ng/mL: Systemic infection (sepsis) is not likely. Local bacterial infection is possible. (NOTE)       Sepsis PCT Algorithm           Lower Respiratory Tract                                      Infection PCT Algorithm    ----------------------------     ----------------------------         PCT < 0.25 ng/mL                PCT < 0.10 ng/mL          Strongly encourage             Strongly discourage   discontinuation of antibiotics    initiation of antibiotics    ----------------------------     -----------------------------       PCT 0.25 - 0.50 ng/mL            PCT 0.10 - 0.25 ng/mL               OR       >80% decrease in PCT  Discourage initiation of                                            antibiotics      Encourage discontinuation           of antibiotics    ----------------------------     -----------------------------         PCT >= 0.50 ng/mL              PCT 0.26 - 0.50 ng/mL               AND        <80% decrease in PCT             Encourage initiation of                                             antibiotics       Encourage continuation           of antibiotics    ----------------------------     -----------------------------        PCT >= 0.50 ng/mL                  PCT > 0.50 ng/mL               AND         increase in PCT                  Strongly encourage                                      initiation of antibiotics    Strongly encourage escalation           of antibiotics                                     -----------------------------                                           PCT <= 0.25 ng/mL                                                 OR                                        > 80% decrease in PCT                                       Discontinue / Do not initiate  antibiotics  Performed at [facility], [address], [place], [place] [address]   CBC     Status: Abnormal   Collection Time: 08/05/24  5:37 AM  Result Value Ref Range   WBC 2.3 (L) 4.0 - 10.5 K/uL   RBC 2.45 (L) 3.87 - 5.11 MIL/uL   Hemoglobin 8.3 (L) 12.0 - 15.0 g/dL   HCT 75.8 (L) 63.9 - 53.9 %   MCV 98.4 80.0 - 100.0 fL   MCH 33.9 26.0 - 34.0 pg   MCHC 34.4 30.0 - 36.0 g/dL   RDW 85.8 88.4 - 84.4 %   Platelets 26 (LL) 150 - 400 K/uL    Comment: SPECIMEN CHECKED FOR CLOTS REPEATED TO VERIFY Immature Platelet Fraction may be clinically indicated, consider ordering this additional test [id number] This critical result has been called to LOBDELL,M @ 0556 ON 08/05/24 BY JUW by [place] on 08/05/2024 05:54:21, and has been read back.    nRBC 0.0 0.0 - 0.2 %    Comment: Performed at [facility], [address] [place], [place] [address]  Differential     Status: Abnormal   Collection Time: 08/05/24  5:37 AM  Result Value Ref Range   Neutrophils Relative % 57 %   Neutro Abs 1.2 (L) 1.7 - 7.7 K/uL   Lymphocytes Relative 32 %   Lymphs Abs 0.7 0.7 - 4.0 K/uL   Monocytes Relative 5 %   Monocytes Absolute 0.1 0.1 - 1.0 K/uL   Eosinophils Relative 6 %   Eosinophils Absolute 0.1 0.0 - 0.5 K/uL   Basophils Relative 0 %   Basophils Absolute 0.0 0.0 - 0.1 K/uL   Immature Granulocytes 0 %   Abs Immature Granulocytes 0.01 0.00 - 0.07 K/uL   Abs Granulocyte 1.2 (L) 1.5 - 6.5 K/uL    Comment: Performed at [facility], [address], [place], [place] [address]  Vitamin B12     Status: None   Collection Time: 08/05/24  5:37 AM  Result Value Ref Range   Vitamin B-12 515 180 - 914 pg/mL    Comment: (NOTE) This assay is not validated for testing neonatal or myeloproliferative syndrome specimens for Vitamin B12 levels. Performed at [facility], [address], [place], [place] [address]   Folate     Status:  None   Collection Time: 08/05/24  5:37 AM  Result Value Ref Range   Folate 8.3 >5.9 ng/mL    Comment: Performed at [facility], [address] [place], [place] [address]  Iron and TIBC     Status: Abnormal   Collection Time: 08/05/24  5:37 AM  Result Value Ref Range   Iron 19 (L) 28 - 170 ug/dL   TIBC 841 (L) 749 - 549 ug/dL   Saturation Ratios 12 10.4 - 31.8 %   UIBC 139 ug/dL    Comment: Performed at [facility], [address] [place], [place] [address]  Ferritin     Status: None   Collection Time: 08/05/24  5:37 AM  Result Value Ref Range   Ferritin 234 11 - 307 ng/mL    Comment: Performed at [facility], [address] [place], [place] [address]  Reticulocytes     Status: Abnormal   Collection Time: 08/05/24  5:37 AM  Result Value Ref Range   Retic Ct Pct 1.2 0.4 - 3.1 %   RBC. 2.45 (L) 3.87 - 5.11 MIL/uL   Retic Count, Absolute 28.2 19.0 - 186.0 K/uL   Immature Retic Fract  20.9 (H) 2.3 - 15.9 %    Comment: Performed at [facility], [address] [place], [place] [address]  Glucose, capillary     Status: None   Collection Time: 08/05/24  7:29 AM  Result Value Ref Range   Glucose-Capillary 79 70 - 99 mg/dL    Comment: Glucose reference range applies only to samples taken after fasting for at least 8 hours.  Mononucleosis screen     Status: None   Collection Time: 08/05/24  9:44 AM  Result Value Ref Range   Mono Screen NEGATIVE NEGATIVE    Comment: Performed at [facility], [address], [place], [place] [address]  Glucose, capillary     Status: Abnormal   Collection Time: 08/05/24 11:16 AM  Result Value Ref Range   Glucose-Capillary 105 (H) 70 - 99 mg/dL    Comment: Glucose reference range applies only to samples taken after fasting for at least 8 hours.  MRSA Next Gen by PCR, Nasal     Status: None   Collection Time: 08/05/24 11:32 AM   Specimen: Nasal Mucosa; Nasal Swab  Result Value Ref Range   MRSA by PCR Next Gen NOT DETECTED NOT DETECTED    Comment:  (NOTE) The GeneXpert MRSA Assay (FDA approved for NASAL specimens only), is one component of a comprehensive MRSA colonization surveillance program. It is not intended to diagnose MRSA infection nor to guide or monitor treatment for MRSA infections. Test performance is not FDA approved in patients less than 46 years old. Performed at [facility], [address], [place], [place] [address]   Glucose, capillary     Status: None   Collection Time: 08/05/24  4:05 PM  Result Value Ref Range   Glucose-Capillary 98 70 - 99 mg/dL    Comment: Glucose reference range applies only to samples taken after fasting for at least 8 hours.      RADIOGRAPHY:  CT Soft Tissue Neck W Contrast Result Date: 08/03/2024 EXAM: CT NECK WITH CONTRAST 08/03/2024 11:06:11 PM TECHNIQUE: CT of the neck was performed with the administration of intravenous contrast. Multiplanar reformatted images are provided for review. Automated exposure control, iterative reconstruction, and/or weight based adjustment of the mA/kV was utilized to reduce the radiation dose to as low as reasonably achievable. CONTRAST: 75mL iohexol  (OMNIPAQUE ) 300 MG/ML solution COMPARISON: None available. CLINICAL HISTORY: Soft tissue infection suspected, neck, xray done. Pt. Clemens today and also complains of a current mouth infection. FINDINGS: AERODIGESTIVE TRACT: No discrete mass. No edema. SALIVARY GLANDS: The parotid and submandibular glands are unremarkable. THYROID : Unremarkable. LYMPH NODES: No suspicious cervical lymphadenopathy. SOFT TISSUES: No mass or fluid collection. BRAIN, ORBITS, SINUSES AND MASTOIDS: No acute abnormality. LUNGS AND MEDIASTINUM: No acute abnormality. BONES: No focal bone abnormality. VASCULATURE: Calcific atherosclerosis of the aortic arch and carotid bifurcations. SPINE: Moderate spinal canal stenosis at the C5-6 level. IMPRESSION: 1. No acute findings or discrete mass in the neck. Electronically signed by: Franky Stanford MD  08/03/2024 11:34 PM EDT RP Workstation: [id number]   DG Chest Port 1 View Result Date: 08/03/2024 CLINICAL DATA:  Shortness of breath EXAM: PORTABLE CHEST 1 VIEW COMPARISON:  12/26/2006 FINDINGS: Heart and mediastinal contours are within normal limits. No focal opacities or effusions. No acute bony abnormality. Aortic atherosclerosis. IMPRESSION: No active cardiopulmonary disease. Electronically Signed   By: Franky Crease M.D.   On: 08/03/2024 22:03      ASSESSMENT:   Patient is a 68 year old female with past medical history of psoriatic arthritis on  methotrexate  coming in for weakness followed by oral mucositis, currently has pancytopenia.  PLAN:   1.  Pancytopenia:  Likely multifactorial at this time.  Like secondary to spider bite or methotrexate  use or acyclovir use.  Differential also includes bone marrow suppression at this time. Labs reviewed today: Patient has mild iron deficiency and normal B12 and folate levels. Patient has been on methotrexate  for 10-12 years at this time HIV panel: Negative Respiratory panel pending. No B symptoms at this time.  No lymphadenopathy palpated.  - Will send for nutritional studies to rule out nutritional deficiency-iron panel, ferritin, B12, folate already resulted but will also send for copper  -Will also order hepatitis panel - Will also send flow cytometry to rule out leukemia/lymphoma - Will review peripheral smear today - If patient does not have any fevers, can discontinue antibiotics. - Please hold methotrexate  at this time considering it can contribute to pancytopenia.  Patient has been on methotrexate  for several years now and I do not think this pancytopenia is solely contributed by methotrexate . -Some spider bites were associated with pancytopenia especially brown recluse spider.  Patient did report that the spider was brown in color. - If patient does not show any signs of improvement in the next few days, we will consider a bone marrow  biopsy - Please transfuse for hemoglobin less than 7 -Please transfuse for platelets 10 or platelets less than 30 with bleeding - Hold any anticoagulation - Please avoid any myelosuppressive drugs. - When starting oral ferrous sulfate  325 mg every other day.  Monitor for constipation. - Continue to monitor CBC with differential daily. - Please obtain ultrasound abdomen to rule out hepatosplenomegaly   Thank you for involving us  in this patient care.  Hematology will continue to follow.  Please feel free to reach out with any questions or concerns.  75 minutes of total time was spent for this patient encounter, including preparation, face-to-face counseling with the patient and coordination of care, physical exam, and documentation of the encounter.   Mickiel Dry, MD Hematology/Oncology [facility]

## 2024-08-05 NOTE — Plan of Care (Signed)
   Problem: Fluid Volume: Goal: Hemodynamic stability will improve Outcome: Progressing   Problem: Clinical Measurements: Goal: Diagnostic test results will improve Outcome: Progressing Goal: Signs and symptoms of infection will decrease Outcome: Progressing   Problem: Respiratory: Goal: Ability to maintain adequate ventilation will improve Outcome: Progressing

## 2024-08-05 NOTE — Progress Notes (Signed)
 Paperwork faxed to patients PCP office for records, awaiting information at this time.

## 2024-08-05 NOTE — TOC CM/SW Note (Signed)
 Transition of Care [facility]) - Inpatient Brief Assessment   Patient Details  Name: [name] MRN: [id number] Date of Birth: Sep 08, 1956  Transition of Care [facility]) CM/SW Contact:    Lucie Lunger, LCSWA Phone Number: 08/05/2024, 9:42 AM   Clinical Narrative: Transition of Care Department [facility]) has reviewed patient and no TOC needs have been identified at this time. We will continue to monitor patient advancement through interdiciplinary progression rounds. If new patient transition needs arise, please place a TOC consult.  Transition of Care Asessment: Insurance and Status: Insurance coverage has been reviewed Patient has primary care physician: Yes Home environment has been reviewed: From home Prior level of function:: Independent Prior/Current Home Services: No current home services Social Drivers of Health Review: SDOH reviewed no interventions necessary Readmission risk has been reviewed: Yes Transition of care needs: no transition of care needs at this time

## 2024-08-05 NOTE — Progress Notes (Signed)
   08/05/24 1313  Assess: MEWS Score  Temp 100.1 F (37.8 C)  BP (!) 152/65  Pulse Rate (!) 120  SpO2 97 %  O2 Device Room Air  Assess: MEWS Score  MEWS Temp 0  MEWS Systolic 0  MEWS Pulse 2  MEWS RR 0  MEWS LOC 0  MEWS Score 2  MEWS Score Color Yellow  Assess: if the MEWS score is Yellow or Red  Were vital signs accurate and taken at a resting state? No, vital signs rechecked  Does the patient meet 2 or more of the SIRS criteria? No  Notify: Charge Nurse/RN  Name of Charge Nurse/RN Notified Emily, RN  Assess: SIRS CRITERIA  SIRS Temperature  0  SIRS Respirations  0  SIRS Pulse 1  SIRS WBC 1  SIRS Score Sum  2   Patient was up getting assistance with ambulating.

## 2024-08-06 ENCOUNTER — Other Ambulatory Visit: Payer: Self-pay

## 2024-08-06 ENCOUNTER — Inpatient Hospital Stay (HOSPITAL_COMMUNITY)

## 2024-08-06 DIAGNOSIS — L4059 Other psoriatic arthropathy: Secondary | ICD-10-CM | POA: Diagnosis not present

## 2024-08-06 DIAGNOSIS — A419 Sepsis, unspecified organism: Secondary | ICD-10-CM | POA: Diagnosis not present

## 2024-08-06 DIAGNOSIS — N179 Acute kidney failure, unspecified: Secondary | ICD-10-CM | POA: Diagnosis not present

## 2024-08-06 DIAGNOSIS — R162 Hepatomegaly with splenomegaly, not elsewhere classified: Secondary | ICD-10-CM | POA: Diagnosis not present

## 2024-08-06 DIAGNOSIS — D61818 Other pancytopenia: Secondary | ICD-10-CM | POA: Diagnosis not present

## 2024-08-06 LAB — BASIC METABOLIC PANEL WITH GFR
Anion gap: 6 (ref 5–15)
BUN: 9 mg/dL (ref 8–23)
CO2: 21 mmol/L — ABNORMAL LOW (ref 22–32)
Calcium: 7.7 mg/dL — ABNORMAL LOW (ref 8.9–10.3)
Chloride: 109 mmol/L (ref 98–111)
Creatinine, Ser: 0.91 mg/dL (ref 0.44–1.00)
GFR, Estimated: >60 mL/min (ref >60)
Glucose, Bld: 94 mg/dL (ref 70–99)
Potassium: 2.9 mmol/L — ABNORMAL LOW (ref 3.5–5.1)
Sodium: 136 mmol/L (ref 135–145)

## 2024-08-06 LAB — CBC WITH DIFFERENTIAL/PLATELET
Abs Granulocyte: 0.9 K/uL — ABNORMAL LOW (ref 1.5–6.5)
Abs Immature Granulocytes: 0.01 K/uL (ref 0.00–0.07)
Basophils Absolute: 0 K/uL (ref 0.0–0.1)
Basophils Relative: 1 %
Eosinophils Absolute: 0.2 K/uL (ref 0.0–0.5)
Eosinophils Relative: 8 %
HCT: 23.2 % — ABNORMAL LOW (ref 36.0–46.0)
Hemoglobin: 7.9 g/dL — ABNORMAL LOW (ref 12.0–15.0)
Immature Granulocytes: 1 %
Lymphocytes Relative: 36 %
Lymphs Abs: 0.7 K/uL (ref 0.7–4.0)
MCH: 33.6 pg (ref 26.0–34.0)
MCHC: 34.1 g/dL (ref 30.0–36.0)
MCV: 98.7 fL (ref 80.0–100.0)
Monocytes Absolute: 0.1 K/uL (ref 0.1–1.0)
Monocytes Relative: 7 %
Neutro Abs: 0.9 K/uL — ABNORMAL LOW (ref 1.7–7.7)
Neutrophils Relative %: 47 %
Platelets: 27 K/uL — CL (ref 150–400)
RBC: 2.35 MIL/uL — ABNORMAL LOW (ref 3.87–5.11)
RDW: 14.5 % (ref 11.5–15.5)
WBC: 2 K/uL — ABNORMAL LOW (ref 4.0–10.5)
nRBC: 0 % (ref 0.0–0.2)

## 2024-08-06 LAB — HEPATITIS PANEL, ACUTE
HCV Ab: NONREACTIVE
Hep A IgM: NONREACTIVE
Hep B C IgM: NONREACTIVE
Hepatitis B Surface Ag: NONREACTIVE

## 2024-08-06 LAB — GLUCOSE, CAPILLARY
Glucose-Capillary: 93 mg/dL (ref 70–99)
Glucose-Capillary: 92 mg/dL (ref 70–99)
Glucose-Capillary: 127 mg/dL — ABNORMAL HIGH (ref 70–99)

## 2024-08-06 LAB — EPSTEIN-BARR VIRUS VCA, IGM: EBV VCA IgM: <36 U/mL (ref 0.0–35.9)

## 2024-08-06 LAB — CBC
HCT: 23.2 % — ABNORMAL LOW (ref 36.0–46.0)
Hemoglobin: 7.8 g/dL — ABNORMAL LOW (ref 12.0–15.0)
MCH: 33.5 pg (ref 26.0–34.0)
MCHC: 33.6 g/dL (ref 30.0–36.0)
MCV: 99.6 fL (ref 80.0–100.0)
Platelets: 27 K/uL — CL (ref 150–400)
RBC: 2.33 MIL/uL — ABNORMAL LOW (ref 3.87–5.11)
RDW: 14.5 % (ref 11.5–15.5)
WBC: 2 K/uL — ABNORMAL LOW (ref 4.0–10.5)
nRBC: 0 % (ref 0.0–0.2)

## 2024-08-06 LAB — MAGNESIUM: Magnesium: 1.1 mg/dL — ABNORMAL LOW (ref 1.7–2.4)

## 2024-08-06 MED ORDER — POTASSIUM CHLORIDE 10 MEQ/100ML IV SOLN
10.0000 meq | INTRAVENOUS | Status: AC
  Administered 2024-08-06 (×5): 10 meq via INTRAVENOUS
  Filled 2024-08-06 (×4): qty 100

## 2024-08-06 MED ORDER — MAGNESIUM SULFATE 4 GM/100ML IV SOLN
4.0000 g | Freq: Once | INTRAVENOUS | Status: AC
  Administered 2024-08-06: 4 g via INTRAVENOUS
  Filled 2024-08-06: qty 100

## 2024-08-06 NOTE — Plan of Care (Signed)
  Problem: Fluid Volume: Goal: Hemodynamic stability will improve Outcome: Progressing   Problem: Clinical Measurements: Goal: Diagnostic test results will improve Outcome: Progressing Goal: Signs and symptoms of infection will decrease Outcome: Progressing   Problem: Respiratory: Goal: Ability to maintain adequate ventilation will improve Outcome: Progressing   Problem: Coping: Goal: Ability to adjust to condition or change in health will improve Outcome: Progressing

## 2024-08-06 NOTE — Progress Notes (Signed)
   08/06/24 0654  Provider Notification  Provider Name/Title Dr. Charlton  Date Provider Notified 08/06/24  Time Provider Notified [phone]  Method of Notification Page  Notification Reason Critical Result  Test performed and critical result Platelets 27  Date Critical Result Received 08/06/24  Time Critical Result Received 0652  Provider response Other (Comment)

## 2024-08-06 NOTE — Progress Notes (Addendum)
 PROGRESS NOTE   [name]  FMW:[id number] DOB: Apr 05, 1956 DOA: 08/03/2024 PCP: Shona Norleen PEDLAR, MD   Chief Complaint  Patient presents with   Fall   Level of care: Telemetry  Brief Admission History:  68 y.o. female with medical history significant for fibromyalgia, psoriatic arthritis on methotrexate , celiac disease, osteoarthritis and diverticulitis, who presented to the emergency room with acute onset of accidental mechanical fall.  She has been getting weaker lately and slipped off of her couch.  No head injuries.  She did not have any pain.  She has been having severe stool throat which has been treated with antibiotics without improvement.  No cough or wheezing or dyspnea.  No chest pain or palpitations.  No fever or chills.  No nausea or vomiting or abdominal pain.  No bleeding diathesis.  She has been dealing with sore throat and oral stomatitis for a few weeks and had seen a dentist Dr. Gerard and he took swabs of mouth for cultures and she saw her PCP Dr. Zack Hall's office and she was started on nystatin oral suspension but no significant improvement.   Pt says was bitten by a brown spider about 3 weeks ago and developed sores in mouth about 3-4 days prior to going to the dentist.     ED Course: When she came to the ER heart rate was 115 with temperature 100 and pulse ox O2 was 99% on 2 L of O2 by nasal cannula.  Labs revealed hyponatremia and hypochloremia with hypokalemia BUN of 34 with creatinine 1.19 previously normal with a calcium of 8.7 and a AST of 65, albumin 3.3 with total protein of 7.  Lactic acid was 1.5 and CBC showed leukopenia of 2.3 and anemia with hemoglobin 10.3 and hematocrit 30.2 previously normal however 9 years here.  Also showed thrombocytopenia 53.  Respiratory panel came back negative.  Group a strep PCR came back negative. EKG showed sinus tachycardia with a rate of 115 with Q waves in V1 and mild ST segment depression in leads lateral leads. Imaging: CT  soft tissue neck with contrast revealed no acute findings or discrete mass in the neck.  Portable chest x-ray showed no acute cardiopulmonary disease.   The patient was given IV vancomycin  and 1-1/2 L of IV normal saline bolus as well as 500 mg IV Zithromax  and 650 mg p.o. Tylenol .  She was admitted to a medical telemetry bed for further evaluation and management.   Assessment and Plan:  SIRS  - continue medical telemetry monitoring - continue hydration with IV normal saline. - she was initially started on broad-spectrum antibiotics due to pancytopenia but no infection found, stop antibiotics and follow clinically.  Procalcitonin and lactic acid levels are reassuring.   - Will follow blood cultures: no growth to date  - Hematology consulted  Pancytopenia -- cause unknown at this time  -- STOP methotrexate  for now -- appreciate hematology consult and recommendations -- could this be from brown recluse spider bite? There is evidence that brown recluse spider bites can cause pancytopenia-- pt says she was bitten by a spider at back of her mailbox -- continue supportive measures as ordered -- anemia panel pending and I included vitamin B6 in case we consider ordering folinic acid treatment -- abd US  pending to check spleen   Oral Stomatitis and mucositis -- I spoke with ENT on call and reviewed case, the recommendation was for supportive measures -- considered oral dexamethasone  mouth wash but we don't have  available in our system, I checked with pharm D.  -- holding off on systemic steroids per heme/onc in case bone marrow biopsy or flow cytometry studies needed   Hyponatremia - resolved - This was hypovolemic due to volume depletion and dehydration. - Management as above.  AKI (acute kidney injury) - RESOLVED  - This was prerenal due to mild hypovolemia and dehydration. - The patient will be hydrated with IV normal saline with IV potassium chloride  and will follow BMP. - Will avoid  nephrotoxins.  Hypokalemia - Potassium will be replaced and magnesium  level rechecked.  Hypomagnesemia -- severe -- IV replacement ordered, recheck in AM   Type 2 diabetes mellitus with peripheral neuropathy  - The patient will be placed on supplemental coverage with NovoLog . - Will continue basal coverage. - Will continue Neurontin . - Will hold off metformin. CBG (last 3)  Recent Labs    08/05/24 1605 08/05/24 2005 08/06/24 0711  GLUCAP 98 100* 93    Dyslipidemia - Will continue anti- hyperlipidemic therapy.  Anxiety and depression - Will continue Wellbutrin  XL and Xanax .  Polyarticular psoriatic arthritis  -- methotrexate  was discontinued   DVT prophylaxis: SCDs Code Status: Full  Family Communication:  Disposition:    Consultants:  Hematology ENT Pharm D  Procedures:   Antimicrobials:    Subjective: Pt says she feels a little bit stronger, she is still having sore throat and mouth but it is a little less painful.    Objective: Vitals:   08/05/24 1607 08/05/24 2008 08/05/24 2320 08/06/24 0404  BP: 128/69 132/83 126/65 104/86  Pulse: (!) 107 (!) 107 (!) 102 (!) 110  Resp:  18 18 18   Temp: 98.9 F (37.2 C) 99.7 F (37.6 C) 99.2 F (37.3 C) 98.8 F (37.1 C)  TempSrc: Oral Oral Oral   SpO2: 94% 95% 96% 95%  Weight:      Height:        Intake/Output Summary (Last 24 hours) at 08/06/2024 1107 Last data filed at 08/06/2024 0500 Gross per 24 hour  Intake 2239.83 ml  Output --  Net 2239.83 ml   Filed Weights   08/03/24 2108 08/04/24 1400  Weight: 56.2 kg 63.7 kg   Examination:  General exam: Appears calm and Uncomfortable  ENT: diffuse stomatitis/mucositis in mouth and throat Respiratory system: Clear to auscultation. Respiratory effort normal. Cardiovascular system: normal S1 & S2 heard. No JVD, murmurs, rubs, gallops or clicks. No pedal edema. Gastrointestinal system: Abdomen is nondistended, soft and nontender. No organomegaly or masses  felt. Normal bowel sounds heard. Central nervous system: Alert and oriented. No focal neurological deficits. Extremities: Symmetric 5 x 5 power. Skin: No rashes, lesions or ulcers. Psychiatry: Judgement and insight appear normal. Mood & affect appropriate.   Data Reviewed: I have personally reviewed following labs and imaging studies  CBC: Recent Labs  Lab 08/03/24 2202 08/04/24 0621 08/05/24 0537 08/06/24 0400 08/06/24 0448  WBC 2.3* 2.0* 2.3* 2.0* 2.0*  NEUTROABS 1.9  --  1.2* 0.9*  --   HGB 10.3* 8.7* 8.3* 7.9* 7.8*  HCT 30.2* 25.6* 24.1* 23.2* 23.2*  MCV 95.6 98.1 98.4 98.7 99.6  PLT 53* 40* 26* 27* 27*    Basic Metabolic Panel: Recent Labs  Lab 08/03/24 2202 08/04/24 0621 08/05/24 0537 08/06/24 0448  NA 132* 133* 136 136  K 2.8* 3.2* 2.9* 2.9*  CL 95* 104 106 109  CO2 25 22 25  21*  GLUCOSE 150* 103* 89 94  BUN 34* 29* 16 9  CREATININE  1.19* 1.01* 0.92 0.91  CALCIUM 8.7* 7.9* 8.1* 7.7*  MG  --   --   --  1.1*    CBG: Recent Labs  Lab 08/05/24 0729 08/05/24 1116 08/05/24 1605 08/05/24 2005 08/06/24 0711  GLUCAP 79 105* 98 100* 93    Recent Results (from the past 240 hours)  Group A Strep by PCR     Status: None   Collection Time: 08/03/24  9:38 PM   Specimen: Anterior Nasal Swab; Sterile Swab  Result Value Ref Range Status   Group A Strep by PCR NOT DETECTED NOT DETECTED Final    Comment: Performed at [facility], [address], [place], [place] [address]  Resp panel by RT-PCR (RSV, Flu A&B, Covid) Anterior Nasal Swab     Status: None   Collection Time: 08/03/24  9:38 PM   Specimen: Anterior Nasal Swab  Result Value Ref Range Status   SARS Coronavirus 2 by RT PCR NEGATIVE NEGATIVE Final    Comment: (NOTE) SARS-CoV-2 target nucleic acids are NOT DETECTED.  The SARS-CoV-2 RNA is generally detectable in upper respiratory specimens during the acute phase of infection. The lowest concentration of SARS-CoV-2 viral copies this assay can detect  is 138 copies/mL. A negative result does not preclude SARS-Cov-2 infection and should not be used as the sole basis for treatment or other patient management decisions. A negative result may occur with  improper specimen collection/handling, submission of specimen other than nasopharyngeal swab, presence of viral mutation(s) within the areas targeted by this assay, and inadequate number of viral copies(<138 copies/mL). A negative result must be combined with clinical observations, patient history, and epidemiological information. The expected result is Negative.  Fact Sheet for Patients:  BloggerCourse.com  Fact Sheet for Healthcare Providers:  SeriousBroker.it  This test is no t yet approved or cleared by the [place]  FDA and  has been authorized for detection and/or diagnosis of SARS-CoV-2 by FDA under an Emergency Use Authorization (EUA). This EUA will remain  in effect (meaning this test can be used) for the duration of the COVID-19 declaration under Section 564(b)(1) of the Act, 21 U.S.C.section 360bbb-3(b)(1), unless the authorization is terminated  or revoked sooner.       Influenza A by PCR NEGATIVE NEGATIVE Final   Influenza B by PCR NEGATIVE NEGATIVE Final    Comment: (NOTE) The Xpert Xpress SARS-CoV-2/FLU/RSV plus assay is intended as an aid in the diagnosis of influenza from Nasopharyngeal swab specimens and should not be used as a sole basis for treatment. Nasal washings and aspirates are unacceptable for Xpert Xpress SARS-CoV-2/FLU/RSV testing.  Fact Sheet for Patients: BloggerCourse.com  Fact Sheet for Healthcare Providers: SeriousBroker.it  This test is not yet approved or cleared by the [place]  FDA and has been authorized for detection and/or diagnosis of SARS-CoV-2 by FDA under an Emergency Use Authorization (EUA). This EUA will remain in effect  (meaning this test can be used) for the duration of the COVID-19 declaration under Section 564(b)(1) of the Act, 21 U.S.C. section 360bbb-3(b)(1), unless the authorization is terminated or revoked.     Resp Syncytial Virus by PCR NEGATIVE NEGATIVE Final    Comment: (NOTE) Fact Sheet for Patients: BloggerCourse.com  Fact Sheet for Healthcare Providers: SeriousBroker.it  This test is not yet approved or cleared by the [place]  FDA and has been authorized for detection and/or diagnosis of SARS-CoV-2 by FDA under an Emergency Use Authorization (EUA). This EUA will remain in effect (meaning this test can be used)  for the duration of the COVID-19 declaration under Section 564(b)(1) of the Act, 21 U.S.C. section 360bbb-3(b)(1), unless the authorization is terminated or revoked.  Performed at [facility], [address] [place], [place] [address]   Blood culture (routine x 2)     Status: None (Preliminary result)   Collection Time: 08/03/24 11:57 PM   Specimen: Left Antecubital; Blood  Result Value Ref Range Status   Specimen Description LEFT ANTECUBITAL  Final   Special Requests   Final    BOTTLES DRAWN AEROBIC AND ANAEROBIC Blood Culture adequate volume   Culture   Final    NO GROWTH 2 DAYS Performed at [facility], [address] [place], [place] [address]    Report Status PENDING  Incomplete  Blood culture (routine x 2)     Status: None (Preliminary result)   Collection Time: 08/04/24 12:01 AM   Specimen: BLOOD LEFT FOREARM  Result Value Ref Range Status   Specimen Description BLOOD LEFT FOREARM  Final   Special Requests   Final    BOTTLES DRAWN AEROBIC AND ANAEROBIC Blood Culture adequate volume   Culture   Final    NO GROWTH 2 DAYS Performed at [facility], [address], [place], [place] [address]    Report Status PENDING  Incomplete  MRSA Next Gen by PCR, Nasal     Status: None   Collection Time: 08/05/24 11:32  AM   Specimen: Nasal Mucosa; Nasal Swab  Result Value Ref Range Status   MRSA by PCR Next Gen NOT DETECTED NOT DETECTED Final    Comment: (NOTE) The GeneXpert MRSA Assay (FDA approved for NASAL specimens only), is one component of a comprehensive MRSA colonization surveillance program. It is not intended to diagnose MRSA infection nor to guide or monitor treatment for MRSA infections. Test performance is not FDA approved in patients less than 36 years old. Performed at [facility], [address] [place], [place] [address]   Respiratory (~20 pathogens) panel by PCR     Status: None   Collection Time: 08/05/24  1:23 PM   Specimen: Nasopharyngeal Swab; Respiratory  Result Value Ref Range Status   Adenovirus NOT DETECTED NOT DETECTED Final   Coronavirus 229E NOT DETECTED NOT DETECTED Final    Comment: (NOTE) The Coronavirus on the Respiratory Panel, DOES NOT test for the novel  Coronavirus (2019 nCoV)    Coronavirus HKU1 NOT DETECTED NOT DETECTED Final   Coronavirus NL63 NOT DETECTED NOT DETECTED Final   Coronavirus OC43 NOT DETECTED NOT DETECTED Final   Metapneumovirus NOT DETECTED NOT DETECTED Final   Rhinovirus / Enterovirus NOT DETECTED NOT DETECTED Final   Influenza A NOT DETECTED NOT DETECTED Final   Influenza B NOT DETECTED NOT DETECTED Final   Parainfluenza Virus 1 NOT DETECTED NOT DETECTED Final   Parainfluenza Virus 2 NOT DETECTED NOT DETECTED Final   Parainfluenza Virus 3 NOT DETECTED NOT DETECTED Final   Parainfluenza Virus 4 NOT DETECTED NOT DETECTED Final   Respiratory Syncytial Virus NOT DETECTED NOT DETECTED Final   Bordetella pertussis NOT DETECTED NOT DETECTED Final   Bordetella Parapertussis NOT DETECTED NOT DETECTED Final   Chlamydophila pneumoniae NOT DETECTED NOT DETECTED Final   Mycoplasma pneumoniae NOT DETECTED NOT DETECTED Final    Comment: Performed at [facility], [address], [place], [place] [address]     Radiology Studies: US  Abdomen  Complete Result Date: 08/06/2024 EXAM: COMPLETE ABDOMINAL ULTRASOUND TECHNIQUE: Real-time ultrasonography of the abdomen was performed. COMPARISON: 05/12/2022  CLINICAL HISTORY: Hepatosplenomegaly FINDINGS: LIVER: The liver demonstrates normal echogenicity. No intrahepatic biliary ductal dilatation. No mass. BILIARY SYSTEM: No pericholecystic fluid or wall thickening. No cholelithiasis. Negative sonographic Murphy's sign. Common bile duct is within normal limits measuring 5 mm. KIDNEYS: Normal size and contour of kidneys. Normal cortical echogenicity. No hydronephrosis. No calculus. No mass. PANCREAS: Visualized portions of the pancreas are unremarkable. SPLEEN: No acute abnormality. Spleen is within normal limits in size, 10.8 cm maximally . ADRENAL GLANDS: Normal appearance. No mass. VESSELS: Visualized portion of the aorta is normal. Visualized portion of the inferior vena cava is normal. OTHER: No ascites. IMPRESSION: 1. No acute findings. Electronically signed by: Rockey Kilts MD 08/06/2024 11:01 AM EDT RP Workstation: [id number]    Scheduled Meds:  chlorhexidine   15 mL Mouth/Throat QID   ferrous sulfate   325 mg Oral QODAY   folic acid   3 mg Oral Daily   gabapentin   300 mg Oral TID   insulin  aspart  0-9 Units Subcutaneous TID WC   loratadine   10 mg Oral QHS   Plecanatide   3 mg Oral Daily   pyridOXINE   100 mg Oral Daily   QUEtiapine   100 mg Oral QHS   thiamine   100 mg Oral Daily   vitamin B-12  100 mcg Oral Daily   Continuous Infusions:  sodium chloride  100 mL/hr at 08/06/24 0603   potassium chloride  10 mEq (08/06/24 1022)     LOS: 2 days   Time spent: 55 mins  Justus Droke Vicci, MD How to contact the [facility] Attending or Consulting provider 7A - 7P or covering provider during after hours 7P -7A, for this patient?  Check the care team in [facility] and look for a) attending/consulting TRH provider listed and b) the TRH team listed Log into [email or web address] to find provider on call.  Locate the TRH  provider you are looking for under [facility] and page to a number that you can be directly reached. If you still have difficulty reaching the provider, please page the [facility] (Director on Call) for the Hospitalists listed on amion for assistance.  08/06/2024, 11:07 AM

## 2024-08-07 DIAGNOSIS — E871 Hypo-osmolality and hyponatremia: Secondary | ICD-10-CM | POA: Diagnosis not present

## 2024-08-07 DIAGNOSIS — A419 Sepsis, unspecified organism: Secondary | ICD-10-CM | POA: Diagnosis not present

## 2024-08-07 DIAGNOSIS — E876 Hypokalemia: Secondary | ICD-10-CM | POA: Diagnosis not present

## 2024-08-07 DIAGNOSIS — D61818 Other pancytopenia: Secondary | ICD-10-CM | POA: Diagnosis not present

## 2024-08-07 LAB — CBC WITH DIFFERENTIAL/PLATELET
Abs Immature Granulocytes: 0.1 K/uL — ABNORMAL HIGH (ref 0.00–0.07)
Band Neutrophils: 2 %
Basophils Absolute: 0 K/uL (ref 0.0–0.1)
Basophils Relative: 1 %
Eosinophils Absolute: 0.1 K/uL (ref 0.0–0.5)
Eosinophils Relative: 6 %
HCT: 22.8 % — ABNORMAL LOW (ref 36.0–46.0)
Hemoglobin: 7.6 g/dL — ABNORMAL LOW (ref 12.0–15.0)
Lymphocytes Relative: 44 %
Lymphs Abs: 0.8 K/uL (ref 0.7–4.0)
MCH: 33.5 pg (ref 26.0–34.0)
MCHC: 33.3 g/dL (ref 30.0–36.0)
MCV: 100.4 fL — ABNORMAL HIGH (ref 80.0–100.0)
Metamyelocytes Relative: 3 %
Monocytes Absolute: 0.1 K/uL (ref 0.1–1.0)
Monocytes Relative: 4 %
Myelocytes: 1 %
Neutro Abs: 0.8 K/uL — ABNORMAL LOW (ref 1.7–7.7)
Neutrophils Relative %: 39 %
Platelets: 55 K/uL — ABNORMAL LOW (ref 150–400)
RBC: 2.27 MIL/uL — ABNORMAL LOW (ref 3.87–5.11)
RDW: 14.7 % (ref 11.5–15.5)
Smear Review: NORMAL
WBC: 1.9 K/uL — ABNORMAL LOW (ref 4.0–10.5)
nRBC: 0 % (ref 0.0–0.2)

## 2024-08-07 LAB — GLUCOSE, CAPILLARY
Glucose-Capillary: 81 mg/dL (ref 70–99)
Glucose-Capillary: 128 mg/dL — ABNORMAL HIGH (ref 70–99)
Glucose-Capillary: 129 mg/dL — ABNORMAL HIGH (ref 70–99)

## 2024-08-07 LAB — BASIC METABOLIC PANEL WITH GFR
Anion gap: 5 (ref 5–15)
BUN: 6 mg/dL — ABNORMAL LOW (ref 8–23)
CO2: 22 mmol/L (ref 22–32)
Calcium: 7.5 mg/dL — ABNORMAL LOW (ref 8.9–10.3)
Chloride: 109 mmol/L (ref 98–111)
Creatinine, Ser: 0.81 mg/dL (ref 0.44–1.00)
GFR, Estimated: >60 mL/min (ref >60)
Glucose, Bld: 91 mg/dL (ref 70–99)
Potassium: 3.3 mmol/L — ABNORMAL LOW (ref 3.5–5.1)
Sodium: 136 mmol/L (ref 135–145)

## 2024-08-07 LAB — SURGICAL PATHOLOGY

## 2024-08-07 LAB — PROCALCITONIN: Procalcitonin: <0.1 ng/mL

## 2024-08-07 LAB — COPPER, SERUM: Copper: 126 ug/dL (ref 80–158)

## 2024-08-07 LAB — MAGNESIUM: Magnesium: 1.5 mg/dL — ABNORMAL LOW (ref 1.7–2.4)

## 2024-08-07 LAB — VITAMIN B6: Vitamin B6: 8.1 ug/L (ref 3.4–65.2)

## 2024-08-07 LAB — FLOW CYTOMETRY REQUEST - FLUID (INPATIENT)

## 2024-08-07 MED ORDER — SENNA 8.6 MG PO TABS
2.0000 | ORAL_TABLET | Freq: Every day | ORAL | Status: DC
  Administered 2024-08-07 – 2024-08-10 (×4): 17.2 mg via ORAL
  Filled 2024-08-07 (×4): qty 2

## 2024-08-07 MED ORDER — SODIUM CHLORIDE 0.9 % IV SOLN
100.0000 mg | Freq: Two times a day (BID) | INTRAVENOUS | Status: DC
  Administered 2024-08-07 – 2024-08-09 (×4): 100 mg via INTRAVENOUS
  Filled 2024-08-07 (×8): qty 100

## 2024-08-07 MED ORDER — MAGNESIUM SULFATE 2 GM/50ML IV SOLN
2.0000 g | Freq: Once | INTRAVENOUS | Status: AC
  Administered 2024-08-07: 2 g via INTRAVENOUS
  Filled 2024-08-07: qty 50

## 2024-08-07 MED ORDER — POTASSIUM CHLORIDE 20 MEQ PO PACK
40.0000 meq | PACK | Freq: Once | ORAL | Status: AC
  Administered 2024-08-07: 40 meq via ORAL
  Filled 2024-08-07: qty 2

## 2024-08-07 MED ORDER — POLYETHYLENE GLYCOL 3350 17 G PO PACK
17.0000 g | PACK | Freq: Every day | ORAL | Status: DC
  Administered 2024-08-07 – 2024-08-09 (×3): 17 g via ORAL
  Filled 2024-08-07 (×3): qty 1

## 2024-08-07 NOTE — Progress Notes (Signed)
 Mobility Specialist Progress Note:    08/07/24 1449  Mobility  Activity Ambulated with assistance  Level of Assistance Standby assist, set-up cues, supervision of patient - no hands on  Assistive Device Front wheel walker  Distance Ambulated (ft) 25 ft  Range of Motion/Exercises Active;All extremities  Activity Response Tolerated well  Mobility Referral Yes  Mobility visit 1 Mobility  Mobility Specialist Start Time (ACUTE ONLY) 1430  Mobility Specialist Stop Time (ACUTE ONLY) 1449  Mobility Specialist Time Calculation (min) (ACUTE ONLY) 19 min   Pt received requesting assistance, required SBA to stand and ambulate with RW. Tolerated well, asx throughout. Returned supine, RN in room. All needs met.   Sherrilee Ditty Mobility Specialist Please contact via Special educational needs teacher or  Rehab office at [phone]

## 2024-08-07 NOTE — Plan of Care (Signed)
  Problem: Fluid Volume: Goal: Hemodynamic stability will improve Outcome: Progressing   Problem: Clinical Measurements: Goal: Diagnostic test results will improve Outcome: Progressing   Problem: Coping: Goal: Ability to adjust to condition or change in health will improve Outcome: Progressing   Problem: Fluid Volume: Goal: Ability to maintain a balanced intake and output will improve Outcome: Progressing

## 2024-08-07 NOTE — Progress Notes (Signed)
 Mobility Specialist Progress Note:    08/07/24 1027  Mobility  Activity Refused and notified nurse if applicable   Pt kindly refused mobility, states I am in pain and frustrated. All needs met.   Sherrilee Ditty Mobility Specialist Please contact via Special educational needs teacher or  Rehab office at [phone]

## 2024-08-07 NOTE — Progress Notes (Signed)
 PROGRESS NOTE  [name] FMW:[id number] DOB: May 07, 1956 DOA: 08/03/2024 PCP: Shona Norleen PEDLAR, MD  Brief History:  68 y.o. female with medical history significant for fibromyalgia, psoriatic arthritis on methotrexate , celiac disease, osteoarthritis and diverticulitis, who presented to the emergency room with acute onset of accidental mechanical fall.  She has been getting weaker lately and slipped off of her couch.  No head injuries.  She did not have any pain.  She states that she was bitten by a spider on 07/06/2024.  Patient states that she initially saw her dentist on 07/09/2024, and the patient was placed on acyclovir for sores in her mouth.  No cough or wheezing or dyspnea.  No chest pain or palpitations.  No fever or chills.  No nausea or vomiting or abdominal pain.  No bleeding diathesis.  She has been dealing with sore throat and oral stomatitis for a few weeks and had seen a dentist Dr. Gerard and he took swabs of mouth for cultures and she saw her PCP Dr. Zack Hall's office and she was started on nystatin oral suspension but no significant improvement.  She denies any recent travels.  She denies any exotic pets.  She has not been camping, hiking, fishing.  She denies any new medications.  She has not had any recent sick contacts.  She has not traveled outside [place]  for the last several months. Review of the medical record shows that on 06/19/2024, WBC 5.4, hemoglobin 12.3, platelets 221.  LFTs were normal at that time.  In addition, her CBC on 01/26/2024 showed WBC 5.4, hemoglobin 14.1, platelet 223.  She has been on methotrexate  since 01/2020. Since admission, she states that her sore throat has somewhat improved but she still has sores in her mouth.   ED Course: When she came to the ER heart rate was 115 with temperature 100 and pulse ox O2 was 99% on 2 L of O2 by nasal cannula.  Labs revealed hyponatremia and hypochloremia with hypokalemia BUN of 34 with creatinine 1.19  previously normal with a calcium of 8.7 and a AST of 65, albumin 3.3 with total protein of 7.  Lactic acid was 1.5 and CBC showed leukopenia of 2.3 and anemia with hemoglobin 10.3 and hematocrit 30.2 previously normal however 9 years here.  Also showed thrombocytopenia 53.  Respiratory panel came back negative.  Group a strep PCR came back negative. EKG showed sinus tachycardia with a rate of 115 with Q waves in V1 and mild ST segment depression in leads lateral leads. Imaging: CT soft tissue neck with contrast revealed no acute findings or discrete mass in the neck.  Portable chest x-ray showed no acute cardiopulmonary disease.   The patient was given IV vancomycin  and 1-1/2 L of IV normal saline bolus as well as 500 mg IV Zithromax  and 650 mg p.o. Tylenol .  She was admitted to a medical telemetry bed for further evaluation and management.   Assessment/Plan: SIRS/pancytopenia - She presented with tachycardia and leukopenia - She was initially started on aztreonam , vancomycin , and azithromycin  - Viral respiratory panel is negative - Blood cultures negative - CT neck soft tissue is negative for any masses - She had normal LFTs and essentially normal CBC 06/19/24 -abd US --no splenomegay -check RMSF -check Ehrlichia -check lyme -start empiric doxy -CMV DNA -EBV DNA - Holding methotrexate   Transaminasemia -no abd pain -viral hep panel neg -Abd US --neg for hepatobiliary etiology  Hyponatremia - Secondary to  volume depletion and poor solute intake - Improved with IV fluids  Hypomagnesemia - Replete  Diabetes mellitus type 2, controlled - 06/27/2024 hemoglobin A1c 5.8 - Holding metformin  Hypokalemia - Repleted  Anxiety and depression - xanax    Psoriatic arthritis - Holding methotrexate  - follow up Dr. Mai   Oral Stomatitis and mucositis -- Dr. Vicci spoke with ENT on call and reviewed case, the recommendation was for supportive measures  Dyslipidemia - Will continue  anti- hyperlipidemic therapy.   Anxiety and depression - Will continue Wellbutrin  XL and Xanax .   Polyarticular psoriatic arthritis  -- methotrexate  was discontinued         Family Communication:  no Family at bedside  Consultants:  hematology  Code Status:  FULL   DVT Prophylaxis:  SCDs   Procedures: As Listed in Progress Note Above  Antibiotics: Doxy 8/20>>      Subjective: Patient denies fevers, chills, headache, chest pain, dyspnea, nausea, vomiting, diarrhea, abdominal pain, dysuria, hematuria, hematochezia, and melena.   Objective: Vitals:   08/06/24 0404 08/06/24 1434 08/06/24 2024 08/07/24 0323  BP: 104/86 138/71 (!) 145/73 (!) 134/59  Pulse: (!) 110 (!) 108    Resp: 18 20 16 18   Temp: 98.8 F (37.1 C) 98.2 F (36.8 C) 98.4 F (36.9 C) 97.7 F (36.5 C)  TempSrc:  Oral Axillary Axillary  SpO2: 95% 97% 99% 98%  Weight:      Height:        Intake/Output Summary (Last 24 hours) at 08/07/2024 1354 Last data filed at 08/07/2024 0500 Gross per 24 hour  Intake 2347.26 ml  Output --  Net 2347.26 ml   Weight change:  Exam:  General:  Pt is alert, follows commands appropriately, not in acute distress HEENT: No icterus, No thrush, No neck mass, Clinch/AT Cardiovascular: RRR, S1/S2, no rubs, no gallops Respiratory: CTA bilaterally, no wheezing, no crackles, no rhonchi Abdomen: Soft/+BS, non tender, non distended, no guarding Extremities: No edema, No lymphangitis, No petechiae, No rashes, no synovitis   Data Reviewed: I have personally reviewed following labs and imaging studies Basic Metabolic Panel: Recent Labs  Lab 08/03/24 2202 08/04/24 0621 08/05/24 0537 08/06/24 0448 08/07/24 0507  NA 132* 133* 136 136 136  K 2.8* 3.2* 2.9* 2.9* 3.3*  CL 95* 104 106 109 109  CO2 25 22 25  21* 22  GLUCOSE 150* 103* 89 94 91  BUN 34* 29* 16 9 6*  CREATININE 1.19* 1.01* 0.92 0.91 0.81  CALCIUM 8.7* 7.9* 8.1* 7.7* 7.5*  MG  --   --   --  1.1* 1.5*    Liver Function Tests: Recent Labs  Lab 08/03/24 2202  AST 65*  ALT 27  ALKPHOS 46  BILITOT 0.6  PROT 7.0  ALBUMIN 3.3*   No results for input(s): LIPASE, AMYLASE in the last 168 hours. No results for input(s): AMMONIA in the last 168 hours. Coagulation Profile: Recent Labs  Lab 08/04/24 0621  INR 1.2   CBC: Recent Labs  Lab 08/03/24 2202 08/04/24 0621 08/05/24 0537 08/06/24 0400 08/06/24 0448 08/07/24 0507  WBC 2.3* 2.0* 2.3* 2.0* 2.0* 1.9*  NEUTROABS 1.9  --  1.2* 0.9*  --  0.8*  HGB 10.3* 8.7* 8.3* 7.9* 7.8* 7.6*  HCT 30.2* 25.6* 24.1* 23.2* 23.2* 22.8*  MCV 95.6 98.1 98.4 98.7 99.6 100.4*  PLT 53* 40* 26* 27* 27* 55*   Cardiac Enzymes: No results for input(s): CKTOTAL, CKMB, CKMBINDEX, TROPONINI in the last 168 hours. BNP: Invalid input(s): POCBNP  CBG: Recent Labs  Lab 08/06/24 0711 08/06/24 1146 08/06/24 1615 08/07/24 0715 08/07/24 1125  GLUCAP 93 92 127* 81 128*   HbA1C: No results for input(s): HGBA1C in the last 72 hours. Urine analysis: No results found for: COLORURINE, APPEARANCEUR, LABSPEC, PHURINE, GLUCOSEU, HGBUR, BILIRUBINUR, KETONESUR, PROTEINUR, UROBILINOGEN, NITRITE, LEUKOCYTESUR Sepsis Labs: @LABRCNTIP (procalcitonin:4,lacticidven:4) ) Recent Results (from the past 240 hours)  Group A Strep by PCR     Status: None   Collection Time: 08/03/24  9:38 PM   Specimen: Anterior Nasal Swab; Sterile Swab  Result Value Ref Range Status   Group A Strep by PCR NOT DETECTED NOT DETECTED Final    Comment: Performed at [facility], [address], [place], [place] [address]  Resp panel by RT-PCR (RSV, Flu A&B, Covid) Anterior Nasal Swab     Status: None   Collection Time: 08/03/24  9:38 PM   Specimen: Anterior Nasal Swab  Result Value Ref Range Status   SARS Coronavirus 2 by RT PCR NEGATIVE NEGATIVE Final    Comment: (NOTE) SARS-CoV-2 target nucleic acids are NOT DETECTED.  The SARS-CoV-2 RNA is  generally detectable in upper respiratory specimens during the acute phase of infection. The lowest concentration of SARS-CoV-2 viral copies this assay can detect is 138 copies/mL. A negative result does not preclude SARS-Cov-2 infection and should not be used as the sole basis for treatment or other patient management decisions. A negative result may occur with  improper specimen collection/handling, submission of specimen other than nasopharyngeal swab, presence of viral mutation(s) within the areas targeted by this assay, and inadequate number of viral copies(<138 copies/mL). A negative result must be combined with clinical observations, patient history, and epidemiological information. The expected result is Negative.  Fact Sheet for Patients:  BloggerCourse.com  Fact Sheet for Healthcare Providers:  SeriousBroker.it  This test is no t yet approved or cleared by the [place]  FDA and  has been authorized for detection and/or diagnosis of SARS-CoV-2 by FDA under an Emergency Use Authorization (EUA). This EUA will remain  in effect (meaning this test can be used) for the duration of the COVID-19 declaration under Section 564(b)(1) of the Act, 21 U.S.C.section 360bbb-3(b)(1), unless the authorization is terminated  or revoked sooner.       Influenza A by PCR NEGATIVE NEGATIVE Final   Influenza B by PCR NEGATIVE NEGATIVE Final    Comment: (NOTE) The Xpert Xpress SARS-CoV-2/FLU/RSV plus assay is intended as an aid in the diagnosis of influenza from Nasopharyngeal swab specimens and should not be used as a sole basis for treatment. Nasal washings and aspirates are unacceptable for Xpert Xpress SARS-CoV-2/FLU/RSV testing.  Fact Sheet for Patients: BloggerCourse.com  Fact Sheet for Healthcare Providers: SeriousBroker.it  This test is not yet approved or cleared by the [place] FDA and has been authorized for detection and/or diagnosis of SARS-CoV-2 by FDA under an Emergency Use Authorization (EUA). This EUA will remain in effect (meaning this test can be used) for the duration of the COVID-19 declaration under Section 564(b)(1) of the Act, 21 U.S.C. section 360bbb-3(b)(1), unless the authorization is terminated or revoked.     Resp Syncytial Virus by PCR NEGATIVE NEGATIVE Final    Comment: (NOTE) Fact Sheet for Patients: BloggerCourse.com  Fact Sheet for Healthcare Providers: SeriousBroker.it  This test is not yet approved or cleared by the [place]  FDA and has been authorized for detection and/or diagnosis of SARS-CoV-2 by FDA under an Emergency Use Authorization (EUA). This EUA will remain in  effect (meaning this test can be used) for the duration of the COVID-19 declaration under Section 564(b)(1) of the Act, 21 U.S.C. section 360bbb-3(b)(1), unless the authorization is terminated or revoked.  Performed at [facility], [address], [place], [place] [address]   Blood culture (routine x 2)     Status: None (Preliminary result)   Collection Time: 08/03/24 11:57 PM   Specimen: Left Antecubital; Blood  Result Value Ref Range Status   Specimen Description LEFT ANTECUBITAL  Final   Special Requests   Final    BOTTLES DRAWN AEROBIC AND ANAEROBIC Blood Culture adequate volume   Culture   Final    NO GROWTH 3 DAYS Performed at [facility], [address], [place], [place] [address]    Report Status PENDING  Incomplete  Blood culture (routine x 2)     Status: None (Preliminary result)   Collection Time: 08/04/24 12:01 AM   Specimen: BLOOD LEFT FOREARM  Result Value Ref Range Status   Specimen Description BLOOD LEFT FOREARM  Final   Special Requests   Final    BOTTLES DRAWN AEROBIC AND ANAEROBIC Blood Culture adequate volume   Culture   Final    NO GROWTH 3 DAYS Performed at [facility], [address] [place], [place] [address]    Report Status PENDING  Incomplete  MRSA Next Gen by PCR, Nasal     Status: None   Collection Time: 08/05/24 11:32 AM   Specimen: Nasal Mucosa; Nasal Swab  Result Value Ref Range Status   MRSA by PCR Next Gen NOT DETECTED NOT DETECTED Final    Comment: (NOTE) The GeneXpert MRSA Assay (FDA approved for NASAL specimens only), is one component of a comprehensive MRSA colonization surveillance program. It is not intended to diagnose MRSA infection nor to guide or monitor treatment for MRSA infections. Test performance is not FDA approved in patients less than 9 years old. Performed at [facility], [address], [place], [place] [address]   Respiratory (~20 pathogens) panel by PCR     Status: None   Collection Time: 08/05/24  1:23 PM   Specimen: Nasopharyngeal Swab; Respiratory  Result Value Ref Range Status   Adenovirus NOT DETECTED NOT DETECTED Final   Coronavirus 229E NOT DETECTED NOT DETECTED Final    Comment: (NOTE) The Coronavirus on the Respiratory Panel, DOES NOT test for the novel  Coronavirus (2019 nCoV)    Coronavirus HKU1 NOT DETECTED NOT DETECTED Final   Coronavirus NL63 NOT DETECTED NOT DETECTED Final   Coronavirus OC43 NOT DETECTED NOT DETECTED Final   Metapneumovirus NOT DETECTED NOT DETECTED Final   Rhinovirus / Enterovirus NOT DETECTED NOT DETECTED Final   Influenza A NOT DETECTED NOT DETECTED Final   Influenza B NOT DETECTED NOT DETECTED Final   Parainfluenza Virus 1 NOT DETECTED NOT DETECTED Final   Parainfluenza Virus 2 NOT DETECTED NOT DETECTED Final   Parainfluenza Virus 3 NOT DETECTED NOT DETECTED Final   Parainfluenza Virus 4 NOT DETECTED NOT DETECTED Final   Respiratory Syncytial Virus NOT DETECTED NOT DETECTED Final   Bordetella pertussis NOT DETECTED NOT DETECTED Final   Bordetella Parapertussis NOT DETECTED NOT DETECTED Final   Chlamydophila pneumoniae NOT DETECTED NOT DETECTED Final   Mycoplasma  pneumoniae NOT DETECTED NOT DETECTED Final    Comment: Performed at [facility], [address], [place], [place] [address]     Scheduled Meds:  chlorhexidine   15 mL Mouth/Throat QID   ferrous sulfate   325  mg Oral QODAY   folic acid   3 mg Oral Daily   gabapentin   300 mg Oral TID   insulin  aspart  0-9 Units Subcutaneous TID WC   loratadine   10 mg Oral QHS   Plecanatide   3 mg Oral Daily   polyethylene glycol  17 g Oral Daily   pyridOXINE   100 mg Oral Daily   QUEtiapine   100 mg Oral QHS   senna  2 tablet Oral Daily   thiamine   100 mg Oral Daily   vitamin B-12  100 mcg Oral Daily   Continuous Infusions:  sodium chloride  100 mL/hr at 08/07/24 9671    Procedures/Studies: US  Abdomen Complete Result Date: 08/06/2024 EXAM: COMPLETE ABDOMINAL ULTRASOUND TECHNIQUE: Real-time ultrasonography of the abdomen was performed. COMPARISON: 05/12/2022 CLINICAL HISTORY: Hepatosplenomegaly FINDINGS: LIVER: The liver demonstrates normal echogenicity. No intrahepatic biliary ductal dilatation. No mass. BILIARY SYSTEM: No pericholecystic fluid or wall thickening. No cholelithiasis. Negative sonographic Murphy's sign. Common bile duct is within normal limits measuring 5 mm. KIDNEYS: Normal size and contour of kidneys. Normal cortical echogenicity. No hydronephrosis. No calculus. No mass. PANCREAS: Visualized portions of the pancreas are unremarkable. SPLEEN: No acute abnormality. Spleen is within normal limits in size, 10.8 cm maximally . ADRENAL GLANDS: Normal appearance. No mass. VESSELS: Visualized portion of the aorta is normal. Visualized portion of the inferior vena cava is normal. OTHER: No ascites. IMPRESSION: 1. No acute findings. Electronically signed by: Rockey Kilts MD 08/06/2024 11:01 AM EDT RP Workstation: [id number]   CT Soft Tissue Neck W Contrast Result Date: 08/03/2024 EXAM: CT NECK WITH CONTRAST 08/03/2024 11:06:11 PM TECHNIQUE: CT of the neck was performed with the administration of  intravenous contrast. Multiplanar reformatted images are provided for review. Automated exposure control, iterative reconstruction, and/or weight based adjustment of the mA/kV was utilized to reduce the radiation dose to as low as reasonably achievable. CONTRAST: 75mL iohexol  (OMNIPAQUE ) 300 MG/ML solution COMPARISON: None available. CLINICAL HISTORY: Soft tissue infection suspected, neck, xray done. Pt. Clemens today and also complains of a current mouth infection. FINDINGS: AERODIGESTIVE TRACT: No discrete mass. No edema. SALIVARY GLANDS: The parotid and submandibular glands are unremarkable. THYROID : Unremarkable. LYMPH NODES: No suspicious cervical lymphadenopathy. SOFT TISSUES: No mass or fluid collection. BRAIN, ORBITS, SINUSES AND MASTOIDS: No acute abnormality. LUNGS AND MEDIASTINUM: No acute abnormality. BONES: No focal bone abnormality. VASCULATURE: Calcific atherosclerosis of the aortic arch and carotid bifurcations. SPINE: Moderate spinal canal stenosis at the C5-6 level. IMPRESSION: 1. No acute findings or discrete mass in the neck. Electronically signed by: Franky Stanford MD 08/03/2024 11:34 PM EDT RP Workstation: [id number]   DG Chest Port 1 View Result Date: 08/03/2024 CLINICAL DATA:  Shortness of breath EXAM: PORTABLE CHEST 1 VIEW COMPARISON:  12/26/2006 FINDINGS: Heart and mediastinal contours are within normal limits. No focal opacities or effusions. No acute bony abnormality. Aortic atherosclerosis. IMPRESSION: No active cardiopulmonary disease. Electronically Signed   By: Franky Crease M.D.   On: 08/03/2024 22:03    Alm Schneider, DO  [facility]  If 7PM-7AM, please contact night-coverage [email or web address] Password [email or web address] 08/07/2024, 1:54 PM   LOS: 3 days

## 2024-08-07 NOTE — Progress Notes (Signed)
   08/07/24 1451  Assess: MEWS Score  Temp 98.4 F (36.9 C)  BP (!) 150/86  MAP (mmHg) 105  Pulse Rate (!) 113  SpO2 99 %  O2 Device Room Air  Assess: MEWS Score  MEWS Temp 0  MEWS Systolic 0  MEWS Pulse 2  MEWS RR 0  MEWS LOC 0  MEWS Score 2  MEWS Score Color Yellow  Assess: if the MEWS score is Yellow or Red  Were vital signs accurate and taken at a resting state? No, vital signs rechecked  Does the patient meet 2 or more of the SIRS criteria? No  MEWS guidelines implemented  Yes, yellow  Treat  MEWS Interventions Considered administering scheduled or prn medications/treatments as ordered  Take Vital Signs  Increase Vital Sign Frequency  Yellow: Q2hr x1, continue Q4hrs until patient remains green for 12hrs  Escalate  MEWS: Escalate Yellow: Discuss with charge nurse and consider notifying provider and/or RRT  Notify: Charge Nurse/RN  Name of Charge Nurse/RN Notified Chief Operating Officer  Assess: SIRS CRITERIA  SIRS Temperature  0  SIRS Respirations  0  SIRS Pulse 1  SIRS WBC 1  SIRS Score Sum  2   Patient's HR elevated due to ambulating to restroom and back to bed.

## 2024-08-08 ENCOUNTER — Inpatient Hospital Stay (HOSPITAL_COMMUNITY)

## 2024-08-08 ENCOUNTER — Other Ambulatory Visit (HOSPITAL_COMMUNITY): Payer: Self-pay

## 2024-08-08 DIAGNOSIS — I5031 Acute diastolic (congestive) heart failure: Secondary | ICD-10-CM

## 2024-08-08 DIAGNOSIS — E876 Hypokalemia: Secondary | ICD-10-CM | POA: Diagnosis not present

## 2024-08-08 DIAGNOSIS — K1230 Oral mucositis (ulcerative), unspecified: Principal | ICD-10-CM

## 2024-08-08 DIAGNOSIS — D61818 Other pancytopenia: Secondary | ICD-10-CM | POA: Diagnosis not present

## 2024-08-08 DIAGNOSIS — A419 Sepsis, unspecified organism: Secondary | ICD-10-CM | POA: Diagnosis not present

## 2024-08-08 LAB — SPOTTED FEVER GROUP ANTIBODIES
Spotted Fever Group IgG: <1:64 {titer}
Spotted Fever Group IgM: <1:64 {titer}

## 2024-08-08 LAB — CBC WITH DIFFERENTIAL/PLATELET
Abs Immature Granulocytes: 0.1 K/uL — ABNORMAL HIGH (ref 0.00–0.07)
Basophils Absolute: 0 K/uL (ref 0.0–0.1)
Basophils Relative: 1 %
Blasts: 3 %
Eosinophils Absolute: 0.2 K/uL (ref 0.0–0.5)
Eosinophils Relative: 10 %
HCT: 24.1 % — ABNORMAL LOW (ref 36.0–46.0)
Hemoglobin: 8 g/dL — ABNORMAL LOW (ref 12.0–15.0)
Lymphocytes Relative: 48 %
Lymphs Abs: 1 K/uL (ref 0.7–4.0)
MCH: 33.5 pg (ref 26.0–34.0)
MCHC: 33.2 g/dL (ref 30.0–36.0)
MCV: 100.8 fL — ABNORMAL HIGH (ref 80.0–100.0)
Metamyelocytes Relative: 1 %
Monocytes Absolute: 0.1 K/uL (ref 0.1–1.0)
Monocytes Relative: 7 %
Myelocytes: 1 %
Neutro Abs: 0.5 K/uL — ABNORMAL LOW (ref 1.7–7.7)
Neutrophils Relative %: 25 %
Platelets: 129 K/uL — ABNORMAL LOW (ref 150–400)
Promyelocytes Relative: 4 %
RBC: 2.39 MIL/uL — ABNORMAL LOW (ref 3.87–5.11)
RDW: 15 % (ref 11.5–15.5)
WBC: 2.1 K/uL — ABNORMAL LOW (ref 4.0–10.5)
nRBC: 0 % (ref 0.0–0.2)

## 2024-08-08 LAB — EHRLICHIA ANTIBODY PANEL
E chaffeensis (HGE) Ab, IgG: NEGATIVE
E chaffeensis (HGE) Ab, IgM: NEGATIVE
E. Chaffeensis (HME) IgM Titer: NEGATIVE
E.Chaffeensis (HME) IgG: NEGATIVE

## 2024-08-08 LAB — COMPREHENSIVE METABOLIC PANEL WITH GFR
ALT: 35 U/L (ref 0–44)
AST: 40 U/L (ref 15–41)
Albumin: 2.3 g/dL — ABNORMAL LOW (ref 3.5–5.0)
Alkaline Phosphatase: 29 U/L — ABNORMAL LOW (ref 38–126)
Anion gap: 5 (ref 5–15)
BUN: <5 mg/dL — ABNORMAL LOW (ref 8–23)
CO2: 22 mmol/L (ref 22–32)
Calcium: 7.8 mg/dL — ABNORMAL LOW (ref 8.9–10.3)
Chloride: 110 mmol/L (ref 98–111)
Creatinine, Ser: 0.75 mg/dL (ref 0.44–1.00)
GFR, Estimated: >60 mL/min (ref >60)
Glucose, Bld: 111 mg/dL — ABNORMAL HIGH (ref 70–99)
Potassium: 3.6 mmol/L (ref 3.5–5.1)
Sodium: 137 mmol/L (ref 135–145)
Total Bilirubin: 0.7 mg/dL (ref 0.0–1.2)
Total Protein: 4.8 g/dL — ABNORMAL LOW (ref 6.5–8.1)

## 2024-08-08 LAB — CYTOMEGALOVIRUS DNA, QUANTITATIVE REAL-TIME PCR, PLASMA
CMV DNA Quant: NEGATIVE [IU]/mL
Log10 CMV Qn DNA Pl: UNDETERMINED {Log_IU}/mL

## 2024-08-08 LAB — PATHOLOGIST SMEAR REVIEW

## 2024-08-08 LAB — BRAIN NATRIURETIC PEPTIDE: B Natriuretic Peptide: 154 pg/mL — ABNORMAL HIGH (ref 0.0–100.0)

## 2024-08-08 LAB — D-DIMER, QUANTITATIVE: D-Dimer, Quant: 2.1 ug{FEU}/mL — ABNORMAL HIGH (ref 0.00–0.50)

## 2024-08-08 LAB — EPSTEIN BARR VRS(EBV DNA BY PCR)
EBV DNA QN by PCR: 1220 [IU]/mL
log10 EBV DNA Qn PCR: 3.086 {Log_IU}/mL

## 2024-08-08 LAB — GLUCOSE, CAPILLARY
Glucose-Capillary: 87 mg/dL (ref 70–99)
Glucose-Capillary: 91 mg/dL (ref 70–99)
Glucose-Capillary: 125 mg/dL — ABNORMAL HIGH (ref 70–99)

## 2024-08-08 LAB — MISC LABCORP TEST (SEND OUT): Labcorp test code: 7658

## 2024-08-08 LAB — T4, FREE: Free T4: 1.09 ng/dL (ref 0.61–1.12)

## 2024-08-08 LAB — TSH: TSH: 1.51 u[IU]/mL (ref 0.350–4.500)

## 2024-08-08 LAB — SAVE SMEAR(SSMR), FOR PROVIDER SLIDE REVIEW

## 2024-08-08 LAB — MAGNESIUM: Magnesium: 1.7 mg/dL (ref 1.7–2.4)

## 2024-08-08 LAB — LYME DISEASE SEROLOGY W/REFLEX: Lyme Total Antibody EIA: NEGATIVE

## 2024-08-08 MED ORDER — FUROSEMIDE 10 MG/ML IJ SOLN
40.0000 mg | Freq: Once | INTRAMUSCULAR | Status: AC
  Administered 2024-08-08: 40 mg via INTRAVENOUS
  Filled 2024-08-08: qty 4

## 2024-08-08 MED ORDER — IOHEXOL 350 MG/ML SOLN
75.0000 mL | Freq: Once | INTRAVENOUS | Status: AC | PRN
  Administered 2024-08-08: 75 mL via INTRAVENOUS

## 2024-08-08 MED ORDER — MAGNESIUM SULFATE 2 GM/50ML IV SOLN
2.0000 g | Freq: Once | INTRAVENOUS | Status: AC
  Administered 2024-08-08: 2 g via INTRAVENOUS
  Filled 2024-08-08: qty 50

## 2024-08-08 NOTE — Progress Notes (Signed)
 Hematology consult progress note  Patient is a 68 year old female with past medical history of fibromyalgia, psoriatic arthritis on methotrexate  who presented to the ER with mechanical fall.  She was found mucositis and pancytopenia on admission and hematology was consulted for the same.  Patient reported that all the symptoms started after a brown spider bite.  Patient reported feeling improved today.  Mucositis is significantly better and is able to eat and drink.  Vitals:   08/08/24 0428 08/08/24 1342  BP: 103/60 129/67  Pulse: (!) 101 (!) 110  Resp: 18 20  Temp: 97.8 F (36.6 C) 99.8 F (37.7 C)  SpO2: 93% 93%     GENERAL:alert, no distress and comfortable SKIN: skin color, texture, turgor are normal, no rashes or significant lesions OROPHARYNX: Mucositis-improved NECK: supple, thyroid  normal size, non-tender, without nodularity LYMPH:  no palpable lymphadenopathy in the cervical, axillary or inguinal LUNGS: clear to auscultation and percussion with normal breathing effort HEART: regular rate & rhythm and no murmurs and no lower extremity edema ABDOMEN:abdomen soft, non-tender and normal bowel sounds Musculoskeletal:no cyanosis of digits and no clubbing  PSYCH: alert & oriented x 3 with fluent speech NEURO: no focal motor/sensory deficits  I reviewed the labs: - CBC: Slight improvement in WBC, hemoglobin and platelets. - CMP: Normal creatinine, LFTs, normal bilirubin - LDH: Normal - Iron panel: Low TSAT, normal ferritin and low iron - Copper : Normal, vitamin B12: Normal, B6: Normal - Flow cytometry: No immunophenotypic evidence of lymphoproliferative disorder -Peripheral smear reviewed: Low platelets, low WBC and neutrophils, some immature white blood cells present.  Normal looking red blood cells -Infectious workup: Lyme total antibody: Negative, CMP PCR: Negative, EBV PCR: Negative, respiratory viral panel: Negative, hepatitis panel: Negative, HIV panel:  Negative  Assessment and plan:  1.  Pancytopenia: Likely secondary to methotrexate  use versus brown recluse spider bite.  Low on differential is other causes such as leukemia.  - Currently CBC looks improved but neutropenia persists.  Peripheral smear reported some blasts but on personal review, looks more like immature cells occurring during bone marrow recovery - Continue to hold methotrexate  - Will recent flow cytometry with morning labs - I reached out to the pathologist [facility] for repeat smear review.  He reported that from 07/28/2024 smear, he did not identify any blasts.  - Methotrexate  level pending at this time - Avoid immunosuppressive medications - Will not consider bone marrow biopsy if patient continues to improve.  If repeat flow cytometry is anything concerning or if patient continues to deteriorate, will consider bone marrow biopsy at that time.  Hematology will continue to follow.  Please reach out with any questions or concerns  Mickiel Dry, MD Hematology/Oncology [facility]

## 2024-08-08 NOTE — Progress Notes (Signed)
 Mobility Specialist Progress Note:    08/08/24 1348  Mobility  Activity Ambulated with assistance  Level of Assistance Modified independent, requires aide device or extra time  Assistive Device Front wheel walker  Distance Ambulated (ft) 25 ft  Range of Motion/Exercises Active;All extremities  Activity Response Tolerated well  Mobility Referral Yes  Mobility visit 1 Mobility  Mobility Specialist Start Time (ACUTE ONLY) 1342  Mobility Specialist Stop Time (ACUTE ONLY) 1352  Mobility Specialist Time Calculation (min) (ACUTE ONLY) 10 min   Pt received requesting assistance, ModI to stand and ambulate with RW. Tolerated well, pt not agreeable to further mobility or sitting in chair d/t fatigue. Returned supine, all needs met.   Sherrilee Ditty Mobility Specialist Please contact via Special educational needs teacher or  Rehab office at [phone]

## 2024-08-08 NOTE — Progress Notes (Addendum)
 PROGRESS NOTE  SHACARA COZINE FMW:[id number] DOB: [birth year] DOA: 08/03/2024 PCP: Shona Norleen PEDLAR, MD  Brief History:  67 y.o. female with medical history significant for fibromyalgia, psoriatic arthritis on methotrexate , celiac disease, osteoarthritis and diverticulitis, who presented to the emergency room with acute onset of accidental mechanical fall.  She has been getting weaker lately and slipped off of her couch.  No head injuries.  She did not have any pain.  She states that she was bitten by a spider on 07/06/2024.  Patient states that she initially saw her dentist on 07/09/2024, and the patient was placed on acyclovir for sores in her mouth.  No cough or wheezing or dyspnea.  No chest pain or palpitations.  No fever or chills.  No nausea or vomiting or abdominal pain.  No bleeding diathesis.  She has been dealing with sore throat and oral stomatitis for a few weeks and had seen a dentist Dr. Gerard and he took swabs of mouth for cultures and she saw her PCP Dr. Zack Hall's office and she was started on nystatin oral suspension but no significant improvement.  She denies any recent travels.  She denies any exotic pets.  She has not been camping, hiking, fishing.  She denies any new medications.  She has not had any recent sick contacts.  She has not traveled outside [place]  for the last several months. Review of the medical record shows that on 06/19/2024, WBC 5.4, hemoglobin 12.3, platelets 221.  LFTs were normal at that time.  In addition, her CBC on 01/26/2024 showed WBC 5.4, hemoglobin 14.1, platelet 223.  She has been on methotrexate  since 01/2020. Since admission, she states that her sore throat has somewhat improved but she still has sores in her mouth.   ED Course: When she came to the ER heart rate was 115 with temperature 100 and pulse ox O2 was 99% on 2 L of O2 by nasal cannula.  Labs revealed hyponatremia and hypochloremia with hypokalemia BUN of 34 with creatinine 1.19  previously normal with a calcium of 8.7 and a AST of 65, albumin 3.3 with total protein of 7.  Lactic acid was 1.5 and CBC showed leukopenia of 2.3 and anemia with hemoglobin 10.3 and hematocrit 30.2 previously normal however 9 years here.  Also showed thrombocytopenia 53.  Respiratory panel came back negative.  Group a strep PCR came back negative. EKG showed sinus tachycardia with a rate of 115 with Q waves in V1 and mild ST segment depression in leads lateral leads. Imaging: CT soft tissue neck with contrast revealed no acute findings or discrete mass in the neck.  Portable chest x-ray showed no acute cardiopulmonary disease.   The patient was given IV vancomycin  and 1-1/2 L of IV normal saline bolus as well as 500 mg IV Zithromax  and 650 mg p.o. Tylenol .  She was admitted to a medical telemetry bed for further evaluation and management.   Assessment/Plan: SIRS/pancytopenia - She presented with tachycardia and leukopenia - She was initially started on aztreonam , vancomycin , and azithromycin  - Viral respiratory panel is negative - Blood cultures negative - CT neck soft tissue is negative for any masses - She had normal LFTs and essentially normal CBC 06/19/24 -abd US --no splenomegaly -check RMSF -check Ehrlichia -check lyme -continue empiric doxycycline  -CMV DNA -EBV DNA - Holding methotrexate  - Hgb and platelets slowly improving; WBC stable   Transaminasemia -no abd pain -viral hep panel neg -Abd  US --neg for hepatobiliary etiology -trending down   Hyponatremia - Secondary to volume depletion and poor solute intake - Improved with IV fluids   Hypomagnesemia - Replete   Diabetes mellitus type 2, controlled - 06/27/2024 hemoglobin A1c 5.8 - Holding metformin   Hypokalemia - Repleted   Anxiety and depression - xanax   - PDMP xanax  0.5 mg, #120, filled 06/14/24 - tramadol  #60, filled 05/25/24   Psoriatic arthritis - Holding methotrexate  - follow up Dr. Mai   Oral  Stomatitis and mucositis -- Dr. Vicci spoke with ENT on call and reviewed case, the recommendation was for supportive measures - overall improving, tolerating diet now   Anxiety and depression - Will continue Xanax . -- no longer taking buproprion nor duloxetine    Polyarticular psoriatic arthritis  -- methotrexate  was discontinued   Elevated D-dimer -CTA chest             Family Communication:  no Family at bedside   Consultants:  hematology   Code Status:  FULL    DVT Prophylaxis:  SCDs           Subjective: Patient denies fevers, chills, headache, chest pain, dyspnea, nausea, vomiting, diarrhea, abdominal pain, dysuria, hematuria, hematochezia, and melena.   Objective: Vitals:   08/07/24 1451 08/07/24 2006 08/08/24 0047 08/08/24 0428  BP: (!) 150/86 (!) 156/96 116/65 103/60  Pulse: (!) 113 (!) 113 99 (!) 101  Resp:  20 16 18   Temp: 98.4 F (36.9 C) 100.1 F (37.8 C) 98.9 F (37.2 C) 97.8 F (36.6 C)  TempSrc: Oral Oral Oral Oral  SpO2: 99% 95% 93% 93%  Weight:      Height:        Intake/Output Summary (Last 24 hours) at 08/08/2024 0901 Last data filed at 08/08/2024 0441 Gross per 24 hour  Intake 2468.54 ml  Output --  Net 2468.54 ml   Weight change:  Exam:  General:  Pt is alert, follows commands appropriately, not in acute distress HEENT: No icterus, No thrush, No neck mass, [place]/AT Cardiovascular: RRR, S1/S2, no rubs, no gallops Respiratory: CTA bilaterally, no wheezing, no crackles, no rhonchi Abdomen: Soft/+BS, non tender, non distended, no guarding Extremities: No edema, No lymphangitis, No petechiae, No rashes, no synovitis   Data Reviewed: I have personally reviewed following labs and imaging studies Basic Metabolic Panel: Recent Labs  Lab 08/04/24 0621 08/05/24 0537 08/06/24 0448 08/07/24 0507 08/08/24 0452  NA 133* 136 136 136 137  K 3.2* 2.9* 2.9* 3.3* 3.6  CL 104 106 109 109 110  CO2 22 25 21* 22 22  GLUCOSE 103* 89 94  91 111*  BUN 29* 16 9 6* <5*  CREATININE 1.01* 0.92 0.91 0.81 0.75  CALCIUM 7.9* 8.1* 7.7* 7.5* 7.8*  MG  --   --  1.1* 1.5* 1.7   Liver Function Tests: Recent Labs  Lab 08/03/24 2202 08/08/24 0452  AST 65* 40  ALT 27 35  ALKPHOS 46 29*  BILITOT 0.6 0.7  PROT 7.0 4.8*  ALBUMIN 3.3* 2.3*   No results for input(s): LIPASE, AMYLASE in the last 168 hours. No results for input(s): AMMONIA in the last 168 hours. Coagulation Profile: Recent Labs  Lab 08/04/24 0621  INR 1.2   CBC: Recent Labs  Lab 08/03/24 2202 08/04/24 0621 08/05/24 0537 08/06/24 0400 08/06/24 0448 08/07/24 0507 08/08/24 0452  WBC 2.3*   < > 2.3* 2.0* 2.0* 1.9* 2.1*  NEUTROABS 1.9  --  1.2* 0.9*  --  0.8* 0.5*  HGB  10.3*   < > 8.3* 7.9* 7.8* 7.6* 8.0*  HCT 30.2*   < > 24.1* 23.2* 23.2* 22.8* 24.1*  MCV 95.6   < > 98.4 98.7 99.6 100.4* 100.8*  PLT 53*   < > 26* 27* 27* 55* 129*   < > = values in this interval not displayed.   Cardiac Enzymes: No results for input(s): CKTOTAL, CKMB, CKMBINDEX, TROPONINI in the last 168 hours. BNP: Invalid input(s): POCBNP CBG: Recent Labs  Lab 08/06/24 1615 08/07/24 0715 08/07/24 1125 08/07/24 1652 08/08/24 0733  GLUCAP 127* 81 128* 129* 87   HbA1C: No results for input(s): HGBA1C in the last 72 hours. Urine analysis: No results found for: COLORURINE, APPEARANCEUR, LABSPEC, PHURINE, GLUCOSEU, HGBUR, BILIRUBINUR, KETONESUR, PROTEINUR, UROBILINOGEN, NITRITE, LEUKOCYTESUR Sepsis Labs: @LABRCNTIP (procalcitonin:4,lacticidven:4) ) Recent Results (from the past 240 hours)  Group A Strep by PCR     Status: None   Collection Time: 08/03/24  9:38 PM   Specimen: Anterior Nasal Swab; Sterile Swab  Result Value Ref Range Status   Group A Strep by PCR NOT DETECTED NOT DETECTED Final    Comment: Performed at [facility], [address] [place], [place] [address]  Resp panel by RT-PCR (RSV, Flu A&B, Covid) Anterior Nasal Swab      Status: None   Collection Time: 08/03/24  9:38 PM   Specimen: Anterior Nasal Swab  Result Value Ref Range Status   SARS Coronavirus 2 by RT PCR NEGATIVE NEGATIVE Final    Comment: (NOTE) SARS-CoV-2 target nucleic acids are NOT DETECTED.  The SARS-CoV-2 RNA is generally detectable in upper respiratory specimens during the acute phase of infection. The lowest concentration of SARS-CoV-2 viral copies this assay can detect is 138 copies/mL. A negative result does not preclude SARS-Cov-2 infection and should not be used as the sole basis for treatment or other patient management decisions. A negative result may occur with  improper specimen collection/handling, submission of specimen other than nasopharyngeal swab, presence of viral mutation(s) within the areas targeted by this assay, and inadequate number of viral copies(<138 copies/mL). A negative result must be combined with clinical observations, patient history, and epidemiological information. The expected result is Negative.  Fact Sheet for Patients:  BloggerCourse.com  Fact Sheet for Healthcare Providers:  SeriousBroker.it  This test is no t yet approved or cleared by the [place]  FDA and  has been authorized for detection and/or diagnosis of SARS-CoV-2 by FDA under an Emergency Use Authorization (EUA). This EUA will remain  in effect (meaning this test can be used) for the duration of the COVID-19 declaration under Section 564(b)(1) of the Act, 21 U.S.C.section 360bbb-3(b)(1), unless the authorization is terminated  or revoked sooner.       Influenza A by PCR NEGATIVE NEGATIVE Final   Influenza B by PCR NEGATIVE NEGATIVE Final    Comment: (NOTE) The Xpert Xpress SARS-CoV-2/FLU/RSV plus assay is intended as an aid in the diagnosis of influenza from Nasopharyngeal swab specimens and should not be used as a sole basis for treatment. Nasal washings and aspirates are  unacceptable for Xpert Xpress SARS-CoV-2/FLU/RSV testing.  Fact Sheet for Patients: BloggerCourse.com  Fact Sheet for Healthcare Providers: SeriousBroker.it  This test is not yet approved or cleared by the [place]  FDA and has been authorized for detection and/or diagnosis of SARS-CoV-2 by FDA under an Emergency Use Authorization (EUA). This EUA will remain in effect (meaning this test can be used) for the duration of the COVID-19 declaration under Section 564(b)(1) of the  Act, 21 U.S.C. section 360bbb-3(b)(1), unless the authorization is terminated or revoked.     Resp Syncytial Virus by PCR NEGATIVE NEGATIVE Final    Comment: (NOTE) Fact Sheet for Patients: BloggerCourse.com  Fact Sheet for Healthcare Providers: SeriousBroker.it  This test is not yet approved or cleared by the [place]  FDA and has been authorized for detection and/or diagnosis of SARS-CoV-2 by FDA under an Emergency Use Authorization (EUA). This EUA will remain in effect (meaning this test can be used) for the duration of the COVID-19 declaration under Section 564(b)(1) of the Act, 21 U.S.C. section 360bbb-3(b)(1), unless the authorization is terminated or revoked.  Performed at [facility], [address] [place], [place] [address]   Blood culture (routine x 2)     Status: None (Preliminary result)   Collection Time: 08/03/24 11:57 PM   Specimen: Left Antecubital; Blood  Result Value Ref Range Status   Specimen Description LEFT ANTECUBITAL  Final   Special Requests   Final    BOTTLES DRAWN AEROBIC AND ANAEROBIC Blood Culture adequate volume   Culture   Final    NO GROWTH 4 DAYS Performed at [facility], [address] [place], [place] [address]    Report Status PENDING  Incomplete  Blood culture (routine x 2)     Status: None (Preliminary result)   Collection Time: 08/04/24 12:01 AM    Specimen: BLOOD LEFT FOREARM  Result Value Ref Range Status   Specimen Description BLOOD LEFT FOREARM  Final   Special Requests   Final    BOTTLES DRAWN AEROBIC AND ANAEROBIC Blood Culture adequate volume   Culture   Final    NO GROWTH 4 DAYS Performed at [facility], [address], [place], [place] [address]    Report Status PENDING  Incomplete  MRSA Next Gen by PCR, Nasal     Status: None   Collection Time: 08/05/24 11:32 AM   Specimen: Nasal Mucosa; Nasal Swab  Result Value Ref Range Status   MRSA by PCR Next Gen NOT DETECTED NOT DETECTED Final    Comment: (NOTE) The GeneXpert MRSA Assay (FDA approved for NASAL specimens only), is one component of a comprehensive MRSA colonization surveillance program. It is not intended to diagnose MRSA infection nor to guide or monitor treatment for MRSA infections. Test performance is not FDA approved in patients less than 25 years old. Performed at [facility], [address] [place], [place] [address]   Respiratory (~20 pathogens) panel by PCR     Status: None   Collection Time: 08/05/24  1:23 PM   Specimen: Nasopharyngeal Swab; Respiratory  Result Value Ref Range Status   Adenovirus NOT DETECTED NOT DETECTED Final   Coronavirus 229E NOT DETECTED NOT DETECTED Final    Comment: (NOTE) The Coronavirus on the Respiratory Panel, DOES NOT test for the novel  Coronavirus (2019 nCoV)    Coronavirus HKU1 NOT DETECTED NOT DETECTED Final   Coronavirus NL63 NOT DETECTED NOT DETECTED Final   Coronavirus OC43 NOT DETECTED NOT DETECTED Final   Metapneumovirus NOT DETECTED NOT DETECTED Final   Rhinovirus / Enterovirus NOT DETECTED NOT DETECTED Final   Influenza A NOT DETECTED NOT DETECTED Final   Influenza B NOT DETECTED NOT DETECTED Final   Parainfluenza Virus 1 NOT DETECTED NOT DETECTED Final   Parainfluenza Virus 2 NOT DETECTED NOT DETECTED Final   Parainfluenza Virus 3 NOT DETECTED NOT DETECTED Final   Parainfluenza Virus 4 NOT DETECTED NOT  DETECTED Final   Respiratory Syncytial  Virus NOT DETECTED NOT DETECTED Final   Bordetella pertussis NOT DETECTED NOT DETECTED Final   Bordetella Parapertussis NOT DETECTED NOT DETECTED Final   Chlamydophila pneumoniae NOT DETECTED NOT DETECTED Final   Mycoplasma pneumoniae NOT DETECTED NOT DETECTED Final    Comment: Performed at [facility], [address] [place], [place] [address]     Scheduled Meds:  chlorhexidine   15 mL Mouth/Throat QID   ferrous sulfate   325 mg Oral QODAY   folic acid   3 mg Oral Daily   gabapentin   300 mg Oral TID   insulin  aspart  0-9 Units Subcutaneous TID WC   loratadine   10 mg Oral QHS   Plecanatide   3 mg Oral Daily   polyethylene glycol  17 g Oral Daily   pyridOXINE   100 mg Oral Daily   QUEtiapine   100 mg Oral QHS   senna  2 tablet Oral Daily   thiamine   100 mg Oral Daily   vitamin B-12  100 mcg Oral Daily   Continuous Infusions:  doxycycline  (VIBRAMYCIN ) IV 100 mg (08/08/24 0425)    Procedures/Studies: US  Abdomen Complete Result Date: 08/06/2024 EXAM: COMPLETE ABDOMINAL ULTRASOUND TECHNIQUE: Real-time ultrasonography of the abdomen was performed. COMPARISON: 05/12/2022 CLINICAL HISTORY: Hepatosplenomegaly FINDINGS: LIVER: The liver demonstrates normal echogenicity. No intrahepatic biliary ductal dilatation. No mass. BILIARY SYSTEM: No pericholecystic fluid or wall thickening. No cholelithiasis. Negative sonographic Murphy's sign. Common bile duct is within normal limits measuring 5 mm. KIDNEYS: Normal size and contour of kidneys. Normal cortical echogenicity. No hydronephrosis. No calculus. No mass. PANCREAS: Visualized portions of the pancreas are unremarkable. SPLEEN: No acute abnormality. Spleen is within normal limits in size, 10.8 cm maximally . ADRENAL GLANDS: Normal appearance. No mass. VESSELS: Visualized portion of the aorta is normal. Visualized portion of the inferior vena cava is normal. OTHER: No ascites. IMPRESSION: 1. No acute findings.  Electronically signed by: Rockey Kilts MD 08/06/2024 11:01 AM EDT RP Workstation: [id number]   CT Soft Tissue Neck W Contrast Result Date: 08/03/2024 EXAM: CT NECK WITH CONTRAST 08/03/2024 11:06:11 PM TECHNIQUE: CT of the neck was performed with the administration of intravenous contrast. Multiplanar reformatted images are provided for review. Automated exposure control, iterative reconstruction, and/or weight based adjustment of the mA/kV was utilized to reduce the radiation dose to as low as reasonably achievable. CONTRAST: 75mL iohexol  (OMNIPAQUE ) 300 MG/ML solution COMPARISON: None available. CLINICAL HISTORY: Soft tissue infection suspected, neck, xray done. Pt. Clemens today and also complains of a current mouth infection. FINDINGS: AERODIGESTIVE TRACT: No discrete mass. No edema. SALIVARY GLANDS: The parotid and submandibular glands are unremarkable. THYROID : Unremarkable. LYMPH NODES: No suspicious cervical lymphadenopathy. SOFT TISSUES: No mass or fluid collection. BRAIN, ORBITS, SINUSES AND MASTOIDS: No acute abnormality. LUNGS AND MEDIASTINUM: No acute abnormality. BONES: No focal bone abnormality. VASCULATURE: Calcific atherosclerosis of the aortic arch and carotid bifurcations. SPINE: Moderate spinal canal stenosis at the C5-6 level. IMPRESSION: 1. No acute findings or discrete mass in the neck. Electronically signed by: Franky Stanford MD 08/03/2024 11:34 PM EDT RP Workstation: [id number]   DG Chest Port 1 View Result Date: 08/03/2024 CLINICAL DATA:  Shortness of breath EXAM: PORTABLE CHEST 1 VIEW COMPARISON:  12/26/2006 FINDINGS: Heart and mediastinal contours are within normal limits. No focal opacities or effusions. No acute bony abnormality. Aortic atherosclerosis. IMPRESSION: No active cardiopulmonary disease. Electronically Signed   By: Franky Crease M.D.   On: 08/03/2024 22:03    Alm Schneider, DO  [facility]  If 7PM-7AM, please contact  night-coverage [email or web address] Password  [email or web address] 08/08/2024, 9:01 AM   LOS: 4 days

## 2024-08-08 NOTE — Plan of Care (Signed)

## 2024-08-08 NOTE — Progress Notes (Signed)
*  PRELIMINARY RESULTS* Echocardiogram 2D Echocardiogram has been performed.  [name] 08/08/2024, 4:54 PM

## 2024-08-09 ENCOUNTER — Other Ambulatory Visit (HOSPITAL_COMMUNITY): Payer: Self-pay

## 2024-08-09 DIAGNOSIS — D61818 Other pancytopenia: Secondary | ICD-10-CM | POA: Diagnosis not present

## 2024-08-09 DIAGNOSIS — A419 Sepsis, unspecified organism: Secondary | ICD-10-CM | POA: Diagnosis not present

## 2024-08-09 DIAGNOSIS — I509 Heart failure, unspecified: Secondary | ICD-10-CM | POA: Diagnosis not present

## 2024-08-09 DIAGNOSIS — L4059 Other psoriatic arthropathy: Secondary | ICD-10-CM | POA: Diagnosis not present

## 2024-08-09 LAB — COMPREHENSIVE METABOLIC PANEL WITH GFR
ALT: 35 U/L (ref 0–44)
AST: 40 U/L (ref 15–41)
Albumin: 2.8 g/dL — ABNORMAL LOW (ref 3.5–5.0)
Alkaline Phosphatase: 34 U/L — ABNORMAL LOW (ref 38–126)
Anion gap: 7 (ref 5–15)
BUN: 6 mg/dL — ABNORMAL LOW (ref 8–23)
CO2: 26 mmol/L (ref 22–32)
Calcium: 8.1 mg/dL — ABNORMAL LOW (ref 8.9–10.3)
Chloride: 105 mmol/L (ref 98–111)
Creatinine, Ser: 0.89 mg/dL (ref 0.44–1.00)
GFR, Estimated: >60 mL/min (ref >60)
Glucose, Bld: 114 mg/dL — ABNORMAL HIGH (ref 70–99)
Potassium: 3.1 mmol/L — ABNORMAL LOW (ref 3.5–5.1)
Sodium: 138 mmol/L (ref 135–145)
Total Bilirubin: 0.5 mg/dL (ref 0.0–1.2)
Total Protein: 5.5 g/dL — ABNORMAL LOW (ref 6.5–8.1)

## 2024-08-09 LAB — CBC WITH DIFFERENTIAL/PLATELET
Abs Immature Granulocytes: 0.1 K/uL — ABNORMAL HIGH (ref 0.00–0.07)
Basophils Absolute: 0 K/uL (ref 0.0–0.1)
Basophils Relative: 0 %
Eosinophils Absolute: 0.4 K/uL (ref 0.0–0.5)
Eosinophils Relative: 15 %
HCT: 27.2 % — ABNORMAL LOW (ref 36.0–46.0)
Hemoglobin: 9 g/dL — ABNORMAL LOW (ref 12.0–15.0)
Lymphocytes Relative: 57 %
Lymphs Abs: 1.4 K/uL (ref 0.7–4.0)
MCH: 33.6 pg (ref 26.0–34.0)
MCHC: 33.1 g/dL (ref 30.0–36.0)
MCV: 101.5 fL — ABNORMAL HIGH (ref 80.0–100.0)
Metamyelocytes Relative: 1 %
Monocytes Absolute: 0.2 K/uL (ref 0.1–1.0)
Monocytes Relative: 7 %
Myelocytes: 4 %
Neutro Abs: 0.4 K/uL — CL (ref 1.7–7.7)
Neutrophils Relative %: 16 %
Platelets: 229 K/uL (ref 150–400)
RBC: 2.68 MIL/uL — ABNORMAL LOW (ref 3.87–5.11)
RDW: 15.6 % — ABNORMAL HIGH (ref 11.5–15.5)
WBC: 2.5 K/uL — ABNORMAL LOW (ref 4.0–10.5)
nRBC: 0 % (ref 0.0–0.2)

## 2024-08-09 LAB — GLUCOSE, CAPILLARY
Glucose-Capillary: 91 mg/dL (ref 70–99)
Glucose-Capillary: 134 mg/dL — ABNORMAL HIGH (ref 70–99)
Glucose-Capillary: 126 mg/dL — ABNORMAL HIGH (ref 70–99)
Glucose-Capillary: 108 mg/dL — ABNORMAL HIGH (ref 70–99)

## 2024-08-09 LAB — ECHOCARDIOGRAM COMPLETE
Area-P 1/2: 4.49 cm2
Height: 60 in
S' Lateral: 2.45 cm
Weight: 2246.93 [oz_av]

## 2024-08-09 LAB — CULTURE, BLOOD (ROUTINE X 2)
Culture: NO GROWTH
Culture: NO GROWTH
Special Requests: ADEQUATE
Special Requests: ADEQUATE

## 2024-08-09 LAB — MAGNESIUM: Magnesium: 1.7 mg/dL (ref 1.7–2.4)

## 2024-08-09 MED ORDER — FUROSEMIDE 10 MG/ML IJ SOLN
40.0000 mg | Freq: Once | INTRAMUSCULAR | Status: AC
  Administered 2024-08-09: 40 mg via INTRAVENOUS
  Filled 2024-08-09: qty 4

## 2024-08-09 MED ORDER — DOXYCYCLINE HYCLATE 100 MG PO TABS
100.0000 mg | ORAL_TABLET | Freq: Two times a day (BID) | ORAL | Status: DC
  Administered 2024-08-09 – 2024-08-10 (×2): 100 mg via ORAL
  Filled 2024-08-09 (×3): qty 1

## 2024-08-09 MED ORDER — POTASSIUM CHLORIDE CRYS ER 20 MEQ PO TBCR
40.0000 meq | EXTENDED_RELEASE_TABLET | Freq: Once | ORAL | Status: AC
  Administered 2024-08-09: 40 meq via ORAL
  Filled 2024-08-09: qty 2

## 2024-08-09 MED ORDER — MAGNESIUM SULFATE 2 GM/50ML IV SOLN
2.0000 g | Freq: Once | INTRAVENOUS | Status: AC
  Administered 2024-08-09: 2 g via INTRAVENOUS
  Filled 2024-08-09: qty 50

## 2024-08-09 MED ORDER — POLYETHYLENE GLYCOL 3350 17 G PO PACK
17.0000 g | PACK | Freq: Every day | ORAL | Status: DC
  Administered 2024-08-10: 17 g via ORAL
  Filled 2024-08-09 (×2): qty 1

## 2024-08-09 NOTE — Plan of Care (Signed)
  Problem: Fluid Volume: Goal: Hemodynamic stability will improve Outcome: Progressing   Problem: Clinical Measurements: Goal: Diagnostic test results will improve Outcome: Progressing Goal: Signs and symptoms of infection will decrease Outcome: Progressing   Problem: Respiratory: Goal: Ability to maintain adequate ventilation will improve Outcome: Progressing   Problem: Coping: Goal: Ability to adjust to condition or change in health will improve Outcome: Progressing   Problem: Fluid Volume: Goal: Ability to maintain a balanced intake and output will improve Outcome: Progressing   Problem: Health Behavior/Discharge Planning: Goal: Ability to identify and utilize available resources and services will improve Outcome: Progressing Goal: Ability to manage health-related needs will improve Outcome: Progressing   Problem: Nutritional: Goal: Maintenance of adequate nutrition will improve Outcome: Progressing Goal: Progress toward achieving an optimal weight will improve Outcome: Progressing   Problem: Skin Integrity: Goal: Risk for impaired skin integrity will decrease Outcome: Progressing   Problem: Education: Goal: Knowledge of General Education information will improve Description: Including pain rating scale, medication(s)/side effects and non-pharmacologic comfort measures Outcome: Progressing   Problem: Health Behavior/Discharge Planning: Goal: Ability to manage health-related needs will improve Outcome: Progressing   Problem: Clinical Measurements: Goal: Ability to maintain clinical measurements within normal limits will improve Outcome: Progressing Goal: Will remain free from infection Outcome: Progressing Goal: Diagnostic test results will improve Outcome: Progressing Goal: Respiratory complications will improve Outcome: Progressing Goal: Cardiovascular complication will be avoided Outcome: Progressing   Problem: Activity: Goal: Risk for activity  intolerance will decrease Outcome: Progressing   Problem: Nutrition: Goal: Adequate nutrition will be maintained Outcome: Progressing   Problem: Elimination: Goal: Will not experience complications related to bowel motility Outcome: Progressing Goal: Will not experience complications related to urinary retention Outcome: Progressing   Problem: Pain Managment: Goal: General experience of comfort will improve and/or be controlled Outcome: Progressing   Problem: Safety: Goal: Ability to remain free from injury will improve Outcome: Progressing

## 2024-08-09 NOTE — Care Management Important Message (Signed)
 Important Message  Patient Details  Name: [name] MRN: [id number] Date of Birth: 07-Nov-1956   Important Message Given:  Yes - Medicare IM     [name] [name] 08/09/2024, 12:00 PM

## 2024-08-09 NOTE — Plan of Care (Signed)
  Problem: Fluid Volume: Goal: Hemodynamic stability will improve Outcome: Progressing   Problem: Clinical Measurements: Goal: Diagnostic test results will improve Outcome: Progressing Goal: Signs and symptoms of infection will decrease Outcome: Progressing   Problem: Respiratory: Goal: Ability to maintain adequate ventilation will improve Outcome: Progressing   Problem: Coping: Goal: Ability to adjust to condition or change in health will improve Outcome: Progressing   Problem: Health Behavior/Discharge Planning: Goal: Ability to identify and utilize available resources and services will improve Outcome: Progressing Goal: Ability to manage health-related needs will improve Outcome: Progressing   Problem: Metabolic: Goal: Ability to maintain appropriate glucose levels will improve Outcome: Progressing   Problem: Nutritional: Goal: Maintenance of adequate nutrition will improve Outcome: Progressing Goal: Progress toward achieving an optimal weight will improve Outcome: Progressing   Problem: Skin Integrity: Goal: Risk for impaired skin integrity will decrease Outcome: Progressing   Problem: Health Behavior/Discharge Planning: Goal: Ability to manage health-related needs will improve Outcome: Progressing   Problem: Education: Goal: Knowledge of General Education information will improve Description: Including pain rating scale, medication(s)/side effects and non-pharmacologic comfort measures Outcome: Progressing   Problem: Clinical Measurements: Goal: Ability to maintain clinical measurements within normal limits will improve Outcome: Progressing Goal: Will remain free from infection Outcome: Progressing Goal: Diagnostic test results will improve Outcome: Progressing Goal: Respiratory complications will improve Outcome: Progressing Goal: Cardiovascular complication will be avoided Outcome: Progressing

## 2024-08-09 NOTE — Progress Notes (Addendum)
 PROGRESS NOTE  [name] FMW:[id number] DOB: Mar 16, 1956 DOA: 08/03/2024 PCP: Shona Norleen PEDLAR, MD  Brief History:  68 y.o. female with medical history significant for fibromyalgia, psoriatic arthritis on methotrexate , celiac disease, osteoarthritis and diverticulitis, who presented to the emergency room with acute onset of accidental mechanical fall.  She has been getting weaker lately and slipped off of her couch.  No head injuries.  She did not have any pain.  She states that she was bitten by a spider on 07/06/2024.  Patient states that she initially saw her dentist on 07/09/2024, and the patient was placed on acyclovir for sores in her mouth.  No cough or wheezing or dyspnea.  No chest pain or palpitations.  No fever or chills.  No nausea or vomiting or abdominal pain.  No bleeding diathesis.  She has been dealing with sore throat and oral stomatitis for a few weeks and had seen a dentist Dr. Gerard and he took swabs of mouth for cultures and she saw her PCP Dr. Zack Hall's office and she was started on nystatin oral suspension but no significant improvement.  She denies any recent travels.  She denies any exotic pets.  She has not been camping, hiking, fishing.  She denies any new medications.  She has not had any recent sick contacts.  She has not traveled outside [place]  for the last several months. Review of the medical record shows that on 06/19/2024, WBC 5.4, hemoglobin 12.3, platelets 221.  LFTs were normal at that time.  In addition, her CBC on 01/26/2024 showed WBC 5.4, hemoglobin 14.1, platelet 223.  She has been on methotrexate  since 01/2020. Since admission, she states that her sore throat has somewhat improved but she still has sores in her mouth.   ED Course: When she came to the ER heart rate was 115 with temperature 100 and pulse ox O2 was 99% on 2 L of O2 by nasal cannula.  Labs revealed hyponatremia and hypochloremia with hypokalemia BUN of 34 with creatinine 1.19  previously normal with a calcium of 8.7 and a AST of 65, albumin 3.3 with total protein of 7.  Lactic acid was 1.5 and CBC showed leukopenia of 2.3 and anemia with hemoglobin 10.3 and hematocrit 30.2 previously normal however 70 years here.  Also showed thrombocytopenia 53.  Respiratory panel came back negative.  Group a strep PCR came back negative. EKG showed sinus tachycardia with a rate of 115 with Q waves in V1 and mild ST segment depression in leads lateral leads. Imaging: CT soft tissue neck with contrast revealed no acute findings or discrete mass in the neck.  Portable chest x-ray showed no acute cardiopulmonary disease.   The patient was given IV vancomycin  and 1-1/2 L of IV normal saline bolus as well as 500 mg IV Zithromax  and 650 mg p.o. Tylenol .  She was admitted to a medical telemetry bed for further evaluation and management.   Assessment/Plan: SIRS/pancytopenia - She presented with tachycardia and leukopenia - She was initially started on aztreonam , vancomycin , and azithromycin  - Viral respiratory panel is negative - Blood cultures negative - CT neck soft tissue is negative for any masses - She had normal LFTs and essentially normal CBC 06/19/24 -abd US --no splenomegaly -check --neg -check Ehrlichia--neg -check lyme--neg -continue empiric doxycycline  -CMV DNA--neg -EBV DNA--1220  copies - Holding methotrexate  - Hgb and platelets slowly improving; WBC stable   Transaminasemia -no abd pain -viral hep panel  neg -Abd US --neg for hepatobiliary etiology -trending down>>normalized  Acute CHF, type unspecified -Echo -started Lasix  40 mg IV daily on 8/21 -08/08/24 CTA chest--no PE; Small to moderate bilateral pleural effusions; interstitial prominence  EBV Viremia -EBV PCR>>1220 copies -suspect pt is in recovery phase with low level viremia and showing clinical improvement   Hyponatremia - Secondary to volume depletion and poor solute intake - Improved     Hypomagnesemia - Replete   Diabetes mellitus type 2, controlled - 06/27/2024 hemoglobin A1c 5.8 - Holding metformin   Hypokalemia - Repleted   Anxiety and depression - xanax   - PDMP xanax  0.5 mg, #120, filled 06/14/24 - tramadol  #60, filled 05/25/24   Psoriatic arthritis - Holding methotrexate  - follow up Dr. Mai   Oral Stomatitis and mucositis -- Dr. Vicci spoke with ENT on call and reviewed case, the recommendation was for supportive measures - overall improving, tolerating diet now   Anxiety and depression - Will continue Xanax . -- no longer taking buproprion nor duloxetine    Polyarticular psoriatic arthritis  -- methotrexate  was discontinued   Elevated D-dimer -08/08/24 CTA chest--no PE; Small to moderate bilateral pleural effusions; interstitial prominence             Family Communication:  no Family at bedside   Consultants:  hematology   Code Status:  FULL    DVT Prophylaxis:  SCDs         Subjective: Patient denies fevers, chills, headache, chest pain, dyspnea, nausea, vomiting, diarrhea, abdominal pain, dysuria, hematuria, hematochezia, and melena.   Objective: Vitals:   08/08/24 1711 08/08/24 1916 08/08/24 2140 08/09/24 0511  BP: (!) 146/73 (!) 144/82 (!) 142/72 118/62  Pulse: (!) 107 (!) 109 (!) 109 100  Resp: 20 20 18 20   Temp: 99.1 F (37.3 C) 99.2 F (37.3 C) 99 F (37.2 C) 98.1 F (36.7 C)  TempSrc: Oral Oral Oral Oral  SpO2: 96% 98% 96% 98%  Weight:      Height:        Intake/Output Summary (Last 24 hours) at 08/09/2024 1207 Last data filed at 08/09/2024 0932 Gross per 24 hour  Intake 720 ml  Output 4200 ml  Net -3480 ml   Weight change:  Exam:  General:  Pt is alert, follows commands appropriately, not in acute distress HEENT: No icterus, No thrush, No neck mass, [place]/AT Cardiovascular: RRR, S1/S2, no rubs, no gallops Respiratory:fine bibasilar crackles. No wheeze Abdomen: Soft/+BS, non tender, non distended, no  guarding Extremities: No edema, No lymphangitis, No petechiae, No rashes, no synovitis   Data Reviewed: I have personally reviewed following labs and imaging studies Basic Metabolic Panel: Recent Labs  Lab 08/05/24 0537 08/06/24 0448 08/07/24 0507 08/08/24 0452 08/09/24 0530  NA 136 136 136 137 138  K 2.9* 2.9* 3.3* 3.6 3.1*  CL 106 109 109 110 105  CO2 25 21* 22 22 26   GLUCOSE 89 94 91 111* 114*  BUN 16 9 6* <5* 6*  CREATININE 0.92 0.91 0.81 0.75 0.89  CALCIUM 8.1* 7.7* 7.5* 7.8* 8.1*  MG  --  1.1* 1.5* 1.7 1.7   Liver Function Tests: Recent Labs  Lab 08/03/24 2202 08/08/24 0452 08/09/24 0530  AST 65* 40 40  ALT 27 35 35  ALKPHOS 46 29* 34*  BILITOT 0.6 0.7 0.5  PROT 7.0 4.8* 5.5*  ALBUMIN 3.3* 2.3* 2.8*   No results for input(s): LIPASE, AMYLASE in the last 168 hours. No results for input(s): AMMONIA in the last 168 hours. Coagulation  Profile: Recent Labs  Lab 08/04/24 0621  INR 1.2   CBC: Recent Labs  Lab 08/05/24 0537 08/06/24 0400 08/06/24 0448 08/07/24 0507 08/08/24 0452 08/09/24 0530  WBC 2.3* 2.0* 2.0* 1.9* 2.1* 2.5*  NEUTROABS 1.2* 0.9*  --  0.8* 0.5* 0.4*  HGB 8.3* 7.9* 7.8* 7.6* 8.0* 9.0*  HCT 24.1* 23.2* 23.2* 22.8* 24.1* 27.2*  MCV 98.4 98.7 99.6 100.4* 100.8* 101.5*  PLT 26* 27* 27* 55* 129* 229   Cardiac Enzymes: No results for input(s): CKTOTAL, CKMB, CKMBINDEX, TROPONINI in the last 168 hours. BNP: Invalid input(s): POCBNP CBG: Recent Labs  Lab 08/08/24 0733 08/08/24 1141 08/08/24 1613 08/09/24 0751 08/09/24 1114  GLUCAP 87 91 125* 91 134*   HbA1C: No results for input(s): HGBA1C in the last 72 hours. Urine analysis: No results found for: COLORURINE, APPEARANCEUR, LABSPEC, PHURINE, GLUCOSEU, HGBUR, BILIRUBINUR, KETONESUR, PROTEINUR, UROBILINOGEN, NITRITE, LEUKOCYTESUR Sepsis Labs: @LABRCNTIP (procalcitonin:4,lacticidven:4) ) Recent Results (from the past 240 hours)  Group A  Strep by PCR     Status: None   Collection Time: 08/03/24  9:38 PM   Specimen: Anterior Nasal Swab; Sterile Swab  Result Value Ref Range Status   Group A Strep by PCR NOT DETECTED NOT DETECTED Final    Comment: Performed at [facility], [address], [place], [place] [address]  Resp panel by RT-PCR (RSV, Flu A&B, Covid) Anterior Nasal Swab     Status: None   Collection Time: 08/03/24  9:38 PM   Specimen: Anterior Nasal Swab  Result Value Ref Range Status   SARS Coronavirus 2 by RT PCR NEGATIVE NEGATIVE Final    Comment: (NOTE) SARS-CoV-2 target nucleic acids are NOT DETECTED.  The SARS-CoV-2 RNA is generally detectable in upper respiratory specimens during the acute phase of infection. The lowest concentration of SARS-CoV-2 viral copies this assay can detect is 138 copies/mL. A negative result does not preclude SARS-Cov-2 infection and should not be used as the sole basis for treatment or other patient management decisions. A negative result may occur with  improper specimen collection/handling, submission of specimen other than nasopharyngeal swab, presence of viral mutation(s) within the areas targeted by this assay, and inadequate number of viral copies(<138 copies/mL). A negative result must be combined with clinical observations, patient history, and epidemiological information. The expected result is Negative.  Fact Sheet for Patients:  BloggerCourse.com  Fact Sheet for Healthcare Providers:  SeriousBroker.it  This test is no t yet approved or cleared by the [place]  FDA and  has been authorized for detection and/or diagnosis of SARS-CoV-2 by FDA under an Emergency Use Authorization (EUA). This EUA will remain  in effect (meaning this test can be used) for the duration of the COVID-19 declaration under Section 564(b)(1) of the Act, 21 U.S.C.section 360bbb-3(b)(1), unless the authorization is terminated  or revoked  sooner.       Influenza A by PCR NEGATIVE NEGATIVE Final   Influenza B by PCR NEGATIVE NEGATIVE Final    Comment: (NOTE) The Xpert Xpress SARS-CoV-2/FLU/RSV plus assay is intended as an aid in the diagnosis of influenza from Nasopharyngeal swab specimens and should not be used as a sole basis for treatment. Nasal washings and aspirates are unacceptable for Xpert Xpress SARS-CoV-2/FLU/RSV testing.  Fact Sheet for Patients: BloggerCourse.com  Fact Sheet for Healthcare Providers: SeriousBroker.it  This test is not yet approved or cleared by the [place]  FDA and has been authorized for detection and/or diagnosis of SARS-CoV-2 by FDA under an Emergency Use Authorization (EUA). This EUA  will remain in effect (meaning this test can be used) for the duration of the COVID-19 declaration under Section 564(b)(1) of the Act, 21 U.S.C. section 360bbb-3(b)(1), unless the authorization is terminated or revoked.     Resp Syncytial Virus by PCR NEGATIVE NEGATIVE Final    Comment: (NOTE) Fact Sheet for Patients: BloggerCourse.com  Fact Sheet for Healthcare Providers: SeriousBroker.it  This test is not yet approved or cleared by the [place]  FDA and has been authorized for detection and/or diagnosis of SARS-CoV-2 by FDA under an Emergency Use Authorization (EUA). This EUA will remain in effect (meaning this test can be used) for the duration of the COVID-19 declaration under Section 564(b)(1) of the Act, 21 U.S.C. section 360bbb-3(b)(1), unless the authorization is terminated or revoked.  Performed at [facility], [address], [place], [place] [address]   Blood culture (routine x 2)     Status: None   Collection Time: 08/03/24 11:57 PM   Specimen: Left Antecubital; Blood  Result Value Ref Range Status   Specimen Description LEFT ANTECUBITAL  Final   Special Requests   Final     BOTTLES DRAWN AEROBIC AND ANAEROBIC Blood Culture adequate volume   Culture   Final    NO GROWTH 5 DAYS Performed at [facility], [address] [place], [place] [address]    Report Status 08/09/2024 FINAL  Final  Blood culture (routine x 2)     Status: None   Collection Time: 08/04/24 12:01 AM   Specimen: BLOOD LEFT FOREARM  Result Value Ref Range Status   Specimen Description BLOOD LEFT FOREARM  Final   Special Requests   Final    BOTTLES DRAWN AEROBIC AND ANAEROBIC Blood Culture adequate volume   Culture   Final    NO GROWTH 5 DAYS Performed at [facility], [address], [place], [place] [address]    Report Status 08/09/2024 FINAL  Final  MRSA Next Gen by PCR, Nasal     Status: None   Collection Time: 08/05/24 11:32 AM   Specimen: Nasal Mucosa; Nasal Swab  Result Value Ref Range Status   MRSA by PCR Next Gen NOT DETECTED NOT DETECTED Final    Comment: (NOTE) The GeneXpert MRSA Assay (FDA approved for NASAL specimens only), is one component of a comprehensive MRSA colonization surveillance program. It is not intended to diagnose MRSA infection nor to guide or monitor treatment for MRSA infections. Test performance is not FDA approved in patients less than 13 years old. Performed at [facility], [address] [place], [place] [address]   Respiratory (~20 pathogens) panel by PCR     Status: None   Collection Time: 08/05/24  1:23 PM   Specimen: Nasopharyngeal Swab; Respiratory  Result Value Ref Range Status   Adenovirus NOT DETECTED NOT DETECTED Final   Coronavirus 229E NOT DETECTED NOT DETECTED Final    Comment: (NOTE) The Coronavirus on the Respiratory Panel, DOES NOT test for the novel  Coronavirus (2019 nCoV)    Coronavirus HKU1 NOT DETECTED NOT DETECTED Final   Coronavirus NL63 NOT DETECTED NOT DETECTED Final   Coronavirus OC43 NOT DETECTED NOT DETECTED Final   Metapneumovirus NOT DETECTED NOT DETECTED Final   Rhinovirus / Enterovirus NOT DETECTED NOT  DETECTED Final   Influenza A NOT DETECTED NOT DETECTED Final   Influenza B NOT DETECTED NOT DETECTED Final   Parainfluenza Virus 1 NOT DETECTED NOT DETECTED Final   Parainfluenza Virus 2 NOT DETECTED NOT DETECTED Final   Parainfluenza Virus  3 NOT DETECTED NOT DETECTED Final   Parainfluenza Virus 4 NOT DETECTED NOT DETECTED Final   Respiratory Syncytial Virus NOT DETECTED NOT DETECTED Final   Bordetella pertussis NOT DETECTED NOT DETECTED Final   Bordetella Parapertussis NOT DETECTED NOT DETECTED Final   Chlamydophila pneumoniae NOT DETECTED NOT DETECTED Final   Mycoplasma pneumoniae NOT DETECTED NOT DETECTED Final    Comment: Performed at [facility], [address] [place], [place] [address]     Scheduled Meds:  chlorhexidine   15 mL Mouth/Throat QID   ferrous sulfate   325 mg Oral QODAY   folic acid   3 mg Oral Daily   furosemide   40 mg Intravenous Once   gabapentin   300 mg Oral TID   insulin  aspart  0-9 Units Subcutaneous TID WC   loratadine   10 mg Oral QHS   Plecanatide   3 mg Oral Daily   polyethylene glycol  17 g Oral Daily   potassium chloride   40 mEq Oral Once   pyridOXINE   100 mg Oral Daily   QUEtiapine   100 mg Oral QHS   senna  2 tablet Oral Daily   thiamine   100 mg Oral Daily   vitamin B-12  100 mcg Oral Daily   Continuous Infusions:  doxycycline  (VIBRAMYCIN ) IV 100 mg (08/09/24 0420)    Procedures/Studies: CT Angio Chest Pulmonary Embolism (PE) W or WO Contrast Result Date: 08/08/2024 CLINICAL DATA:  Pulmonary embolism suspected. Low to intermediate probability. Abnormal D-dimer. EXAM: CT ANGIOGRAPHY CHEST WITH CONTRAST TECHNIQUE: Multidetector CT imaging of the chest was performed using the standard protocol during bolus administration of intravenous contrast. Multiplanar CT image reconstructions and MIPs were obtained to evaluate the vascular anatomy. RADIATION DOSE REDUCTION: This exam was performed according to the departmental dose-optimization program which  includes automated exposure control, adjustment of the mA and/or kV according to patient size and/or use of iterative reconstruction technique. CONTRAST:  75mL OMNIPAQUE  IOHEXOL  350 MG/ML SOLN COMPARISON:  Chest radiography 08/03/2024 FINDINGS: Cardiovascular: Pulmonary arterial opacification is good. There are no pulmonary emboli. Heart size is normal. Extensive coronary artery calcification is noted. There is some aortic atherosclerotic calcification as well. No aortic aneurysm. No pericardial fluid. Mediastinum/Nodes: No mass or lymphadenopathy. Lungs/Pleura: There are small to moderate bilateral pleural effusions layering dependently with dependent pulmonary atelectasis. Non dependent lung shows interstitial prominence consistent with interstitial edema. No evidence of mass or non dependent infiltrate. Upper Abdomen: No significant upper abdominal finding. Musculoskeletal: Ordinary scoliosis and degenerative change. Review of the MIP images confirms the above findings. IMPRESSION: 1. No pulmonary emboli. 2. Small to moderate bilateral pleural effusions layering dependently with dependent pulmonary atelectasis. Interstitial prominence consistent with interstitial edema. Findings consistent with congestive heart failure. 3. Extensive coronary artery calcification. Aortic atherosclerotic calcification. Aortic Atherosclerosis (ICD10-I70.0). Electronically Signed   By: Oneil Officer M.D.   On: 08/08/2024 10:23   US  Abdomen Complete Result Date: 08/06/2024 EXAM: COMPLETE ABDOMINAL ULTRASOUND TECHNIQUE: Real-time ultrasonography of the abdomen was performed. COMPARISON: 05/12/2022 CLINICAL HISTORY: Hepatosplenomegaly FINDINGS: LIVER: The liver demonstrates normal echogenicity. No intrahepatic biliary ductal dilatation. No mass. BILIARY SYSTEM: No pericholecystic fluid or wall thickening. No cholelithiasis. Negative sonographic Murphy's sign. Common bile duct is within normal limits measuring 5 mm. KIDNEYS: Normal size  and contour of kidneys. Normal cortical echogenicity. No hydronephrosis. No calculus. No mass. PANCREAS: Visualized portions of the pancreas are unremarkable. SPLEEN: No acute abnormality. Spleen is within normal limits in size, 10.8 cm maximally . ADRENAL GLANDS: Normal appearance. No mass. VESSELS: Visualized  portion of the aorta is normal. Visualized portion of the inferior vena cava is normal. OTHER: No ascites. IMPRESSION: 1. No acute findings. Electronically signed by: Rockey Kilts MD 08/06/2024 11:01 AM EDT RP Workstation: [id number]   CT Soft Tissue Neck W Contrast Result Date: 08/03/2024 EXAM: CT NECK WITH CONTRAST 08/03/2024 11:06:11 PM TECHNIQUE: CT of the neck was performed with the administration of intravenous contrast. Multiplanar reformatted images are provided for review. Automated exposure control, iterative reconstruction, and/or weight based adjustment of the mA/kV was utilized to reduce the radiation dose to as low as reasonably achievable. CONTRAST: 75mL iohexol  (OMNIPAQUE ) 300 MG/ML solution COMPARISON: None available. CLINICAL HISTORY: Soft tissue infection suspected, neck, xray done. Pt. Clemens today and also complains of a current mouth infection. FINDINGS: AERODIGESTIVE TRACT: No discrete mass. No edema. SALIVARY GLANDS: The parotid and submandibular glands are unremarkable. THYROID : Unremarkable. LYMPH NODES: No suspicious cervical lymphadenopathy. SOFT TISSUES: No mass or fluid collection. BRAIN, ORBITS, SINUSES AND MASTOIDS: No acute abnormality. LUNGS AND MEDIASTINUM: No acute abnormality. BONES: No focal bone abnormality. VASCULATURE: Calcific atherosclerosis of the aortic arch and carotid bifurcations. SPINE: Moderate spinal canal stenosis at the C5-6 level. IMPRESSION: 1. No acute findings or discrete mass in the neck. Electronically signed by: Franky Stanford MD 08/03/2024 11:34 PM EDT RP Workstation: [id number]   DG Chest Port 1 View Result Date: 08/03/2024 CLINICAL DATA:   Shortness of breath EXAM: PORTABLE CHEST 1 VIEW COMPARISON:  12/26/2006 FINDINGS: Heart and mediastinal contours are within normal limits. No focal opacities or effusions. No acute bony abnormality. Aortic atherosclerosis. IMPRESSION: No active cardiopulmonary disease. Electronically Signed   By: Franky Crease M.D.   On: 08/03/2024 22:03    Alm Schneider, DO  [facility]  If 7PM-7AM, please contact night-coverage [email or web address] Password [email or web address] 08/09/2024, 12:07 PM   LOS: 5 days

## 2024-08-10 DIAGNOSIS — A419 Sepsis, unspecified organism: Secondary | ICD-10-CM | POA: Diagnosis not present

## 2024-08-10 DIAGNOSIS — I5031 Acute diastolic (congestive) heart failure: Secondary | ICD-10-CM

## 2024-08-10 DIAGNOSIS — E876 Hypokalemia: Secondary | ICD-10-CM | POA: Diagnosis not present

## 2024-08-10 DIAGNOSIS — D61818 Other pancytopenia: Secondary | ICD-10-CM | POA: Diagnosis not present

## 2024-08-10 DIAGNOSIS — L4059 Other psoriatic arthropathy: Secondary | ICD-10-CM | POA: Diagnosis not present

## 2024-08-10 LAB — CBC WITH DIFFERENTIAL/PLATELET
Abs Immature Granulocytes: 0.01 K/uL (ref 0.00–0.07)
Basophils Absolute: 0 K/uL (ref 0.0–0.1)
Basophils Relative: 0 %
Eosinophils Absolute: 0.3 K/uL (ref 0.0–0.5)
Eosinophils Relative: 10 %
HCT: 27.9 % — ABNORMAL LOW (ref 36.0–46.0)
Hemoglobin: 9.1 g/dL — ABNORMAL LOW (ref 12.0–15.0)
Immature Granulocytes: 0 %
Lymphocytes Relative: 53 %
Lymphs Abs: 1.7 K/uL (ref 0.7–4.0)
MCH: 32.9 pg (ref 26.0–34.0)
MCHC: 32.6 g/dL (ref 30.0–36.0)
MCV: 100.7 fL — ABNORMAL HIGH (ref 80.0–100.0)
Monocytes Absolute: 0.7 K/uL (ref 0.1–1.0)
Monocytes Relative: 21 %
Neutro Abs: 0.5 K/uL — ABNORMAL LOW (ref 1.7–7.7)
Neutrophils Relative %: 16 %
Platelets: 426 K/uL — ABNORMAL HIGH (ref 150–400)
RBC: 2.77 MIL/uL — ABNORMAL LOW (ref 3.87–5.11)
RDW: 15.9 % — ABNORMAL HIGH (ref 11.5–15.5)
WBC: 3.3 K/uL — ABNORMAL LOW (ref 4.0–10.5)
nRBC: 0 % (ref 0.0–0.2)

## 2024-08-10 LAB — BASIC METABOLIC PANEL WITH GFR
Anion gap: 9 (ref 5–15)
BUN: 7 mg/dL — ABNORMAL LOW (ref 8–23)
CO2: 28 mmol/L (ref 22–32)
Calcium: 8.3 mg/dL — ABNORMAL LOW (ref 8.9–10.3)
Chloride: 102 mmol/L (ref 98–111)
Creatinine, Ser: 0.94 mg/dL (ref 0.44–1.00)
GFR, Estimated: >60 mL/min (ref >60)
Glucose, Bld: 104 mg/dL — ABNORMAL HIGH (ref 70–99)
Potassium: 3.9 mmol/L (ref 3.5–5.1)
Sodium: 139 mmol/L (ref 135–145)

## 2024-08-10 LAB — MAGNESIUM: Magnesium: 1.8 mg/dL (ref 1.7–2.4)

## 2024-08-10 LAB — GLUCOSE, CAPILLARY
Glucose-Capillary: 98 mg/dL (ref 70–99)
Glucose-Capillary: 148 mg/dL — ABNORMAL HIGH (ref 70–99)

## 2024-08-10 MED ORDER — DOXYCYCLINE HYCLATE 100 MG PO TABS: 100.0000 mg | ORAL_TABLET | Freq: Two times a day (BID) | ORAL | 0 refills | Status: AC

## 2024-08-10 NOTE — Discharge Summary (Signed)
 Physician Discharge Summary   Patient: [name] MRN: [id number] DOB: 06-17-1956  Admit date:     08/03/2024  Discharge date: 08/10/24  Discharge Physician: Alm Mercede Rollo   PCP: Shona Norleen PEDLAR, MD   Recommendations at discharge:   Please follow up with primary care provider within 1-2 weeks  Please repeat BMP and CBC in one week    Hospital Course: 68 y.o. female with medical history significant for fibromyalgia, psoriatic arthritis on methotrexate , celiac disease, osteoarthritis and diverticulitis, who presented to the emergency room with acute onset of accidental mechanical fall.  She has been getting weaker lately and slipped off of her couch.  No head injuries.  She did not have any pain.  She states that she was bitten by a spider on 07/06/2024.  Patient states that she initially saw her dentist on 07/09/2024, and the patient was placed on acyclovir for sores in her mouth.  No cough or wheezing or dyspnea.  No chest pain or palpitations.  No fever or chills.  No nausea or vomiting or abdominal pain.  No bleeding diathesis.  She has been dealing with sore throat and oral stomatitis for a few weeks and had seen a dentist Dr. Gerard and he took swabs of mouth for cultures and she saw her PCP Dr. Zack Hall's office and she was started on nystatin oral suspension but no significant improvement.  She denies any recent travels.  She denies any exotic pets.  She has not been camping, hiking, fishing.  She denies any new medications.  She has not had any recent sick contacts.  She has not traveled outside [place]  for the last several months. Review of the medical record shows that on 06/19/2024, WBC 5.4, hemoglobin 12.3, platelets 221.  LFTs were normal at that time.  In addition, her CBC on 01/26/2024 showed WBC 5.4, hemoglobin 14.1, platelet 223.  She has been on methotrexate  since 01/2020. Since admission, she states that her sore throat has somewhat improved but she still has sores in her mouth.    ED Course: When she came to the ER heart rate was 115 with temperature 100 and pulse ox O2 was 99% on 2 L of O2 by nasal cannula.  Labs revealed hyponatremia and hypochloremia with hypokalemia BUN of 34 with creatinine 1.19 previously normal with a calcium of 8.7 and a AST of 65, albumin 3.3 with total protein of 7.  Lactic acid was 1.5 and CBC showed leukopenia of 2.3 and anemia with hemoglobin 10.3 and hematocrit 30.2 previously normal however 9 years here.  Also showed thrombocytopenia 53.  Respiratory panel came back negative.  Group a strep PCR came back negative. EKG showed sinus tachycardia with a rate of 115 with Q waves in V1 and mild ST segment depression in leads lateral leads. Imaging: CT soft tissue neck with contrast revealed no acute findings or discrete mass in the neck.  Portable chest x-ray showed no acute cardiopulmonary disease.   The patient was given IV vancomycin  and 1-1/2 L of IV normal saline bolus as well as 500 mg IV Zithromax  and 650 mg p.o. Tylenol .  She was admitted to a medical telemetry bed for further evaluation and management.  Assessment and Plan:  SIRS/pancytopenia - She presented with tachycardia and leukopenia - She was initially started on aztreonam , vancomycin , and azithromycin >>stopped - Viral respiratory panel is negative - Blood cultures negative - CT neck soft tissue is negative for any masses - She had normal LFTs and essentially  normal CBC 06/19/24 -abd US --no splenomegaly -check --neg -check Ehrlichia--neg -check lyme--neg -continue empiric doxycycline >>3 more days after dc -CMV DNA--neg -EBV DNA--1220  copies - Holding methotrexate  - Hgb and platelets slowly improving; WBC stable - 8/22--discussed with hematology, Dr. Donnajean to d/c home as counts stable, improving--out pt follow referral sent -8/23 WBC 3.3, Hgb 9.1, platelets 426   Transaminasemia -no abd pain -viral hep panel neg -Abd US --neg for hepatobiliary etiology -trending  down>>normalized   Acute HFpEF -Echo EF 55-60%, no WMA, G1DD -started Lasix  40 mg IV daily on 8/21 -08/08/24 CTA chest--no PE; Small to moderate bilateral pleural effusions; interstitial prominence -clinically euvolemic at time of dc   EBV Viremia -EBV PCR>>1220 copies -suspect pt is in recovery phase with low level viremia and showing clinical improvement   Hyponatremia - Secondary to volume depletion and poor solute intake - Improved    Hypomagnesemia - Replete   Diabetes mellitus type 2, controlled - 06/27/2024 hemoglobin A1c 5.8 - Holding metformin   Hypokalemia - Repleted   Anxiety and depression - xanax   - PDMP xanax  0.5 mg, #120, filled 06/14/24 - tramadol  #60, filled 05/25/24   Psoriatic arthritis - Holding methotrexate  - follow up Dr. Mai   Oral Stomatitis and mucositis -- Dr. Vicci spoke with ENT on call and reviewed case, the recommendation was for supportive measures - overall improving, tolerating diet now   Anxiety and depression - Will continue Xanax . -- no longer taking buproprion nor duloxetine    Polyarticular psoriatic arthritis  -- methotrexate  was discontinued   Elevated D-dimer -08/08/24 CTA chest--no PE; Small to moderate bilateral pleural effusions; interstitial prominence        Consultants: hematology Procedures performed: none  Disposition: Home Diet recommendation:  Regular diet DISCHARGE MEDICATION: Allergies as of 08/10/2024       Reactions   Cefazolin Anaphylaxis   ANCEF   Codeine Nausea Only   Meperidine Hcl Nausea Only   Morphine Nausea Only   Penicillamine    Penicillins Hives   Sulfonamide Derivatives Other (See Comments)   REACTION IS UNKNOWN   Sulfur Other (See Comments)        Medication List     STOP taking these medications    ibuprofen  600 MG tablet Commonly known as: ADVIL    triamterene-hydrochlorothiazide 75-50 MG tablet Commonly known as: MAXZIDE       TAKE these medications     acetaminophen  325 MG tablet Commonly known as: TYLENOL  Take 650 mg by mouth every 6 (six) hours as needed for moderate pain (pain score 4-6).   ALPRAZolam  0.5 MG tablet Commonly known as: XANAX  Take 0.5 mg by mouth at bedtime as needed for sleep.   doxycycline  100 MG tablet Commonly known as: VIBRA -TABS Take 1 tablet (100 mg total) by mouth every 12 (twelve) hours.   folic acid  1 MG tablet Commonly known as: FOLVITE  TAKE THREE TABLETS BY MOUTH ONCE A DAY.   gabapentin  300 MG capsule Commonly known as: NEURONTIN  Take 300 mg by mouth 3 (three) times daily.   levocetirizine 5 MG tablet Commonly known as: XYZAL  Take 5 mg by mouth at bedtime.   magnesium  30 MG tablet Take 30 mg by mouth daily.   metFORMIN 1000 MG tablet Commonly known as: GLUCOPHAGE Take 1,000 mg by mouth 2 (two) times daily.   methotrexate  2.5 MG tablet Commonly known as: RHEUMATREX Take 5 tablets by mouth once a week.   Mounjaro  10 MG/0.5ML Pen Generic drug: tirzepatide  Inject 10 mg into the skin once  a week.   pyridOXINE  100 MG tablet Commonly known as: VITAMIN B6 Take 100 mg by mouth daily.   QUEtiapine  100 MG tablet Commonly known as: SEROQUEL  Take 100 mg by mouth at bedtime.   thiamine  100 MG tablet Commonly known as: Vitamin B-1 Take 100 mg by mouth daily.   tiZANidine  4 MG capsule Commonly known as: ZANAFLEX  Take 4 mg by mouth 3 (three) times daily as needed for muscle spasms.   traMADol  50 MG tablet Commonly known as: ULTRAM  Take 50 mg by mouth every 6 (six) hours as needed for moderate pain or severe pain.   Trulance  3 MG Tabs Generic drug: Plecanatide  Take 1 tablet (3 mg total) by mouth daily.   vitamin B-12 100 MCG tablet Commonly known as: CYANOCOBALAMIN  Take 100 mcg by mouth daily.   Vitamin D3 75 MCG (3000 UT) Tabs Take by mouth. Patient states hat she takes every other day        Discharge Exam: Filed Weights   08/03/24 2108 08/04/24 1400  Weight: 56.2 kg  63.7 kg   HEENT:  [place]/AT, No thrush, no icterus CV:  RRR, no rub, no S3, no S4 Lung:  CTA, no wheeze, no rhonchi Abd:  soft/+BS, NT Ext:  No edema, no lymphangitis, no synovitis, no rash   Condition at discharge: stable  The results of significant diagnostics from this hospitalization (including imaging, microbiology, ancillary and laboratory) are listed below for reference.   Imaging Studies: ECHOCARDIOGRAM COMPLETE Result Date: 08/09/2024    ECHOCARDIOGRAM REPORT   Patient Name:   [name] Date of Exam: 08/08/2024 Medical Rec #:  [id number]       Height:       60.0 in Accession #:    [id number]      Weight:       140.4 lb Date of Birth:  07/29/1956      BSA:          1.606 m Patient Age:    84 years        BP:           129/67 mmHg Patient Gender: F               HR:           110 bpm. Exam Location:  [facility] Procedure: 2D Echo, Cardiac Doppler and Color Doppler (Both Spectral and Color            Flow Doppler were utilized during procedure). Indications:    CHF, Acute Diastolic l50.31  History:        Patient has no prior history of Echocardiogram examinations.                 Risk Factors:Hypertension, Diabetes and Dyslipidemia.  Sonographer:    Aida Pizza RCS Referring Phys: [id number] Akiko Schexnider IMPRESSIONS  1. Left ventricular ejection fraction, by estimation, is 55 to 60%. The left ventricle has normal function. The left ventricle has no regional wall motion abnormalities. Left ventricular diastolic parameters are consistent with Grade I diastolic dysfunction (impaired relaxation).  2. Right ventricular systolic function is normal. The right ventricular size is normal. Tricuspid regurgitation signal is inadequate for assessing PA pressure.  3. The mitral valve is grossly normal. No evidence of mitral valve regurgitation. No evidence of mitral stenosis.  4. The aortic valve is tricuspid. There is moderate thickening of the aortic valve. Aortic valve regurgitation is not visualized. No  aortic stenosis is present.  5. The  inferior vena cava is normal in size with greater than 50% respiratory variability, suggesting right atrial pressure of 3 mmHg. Comparison(s): No prior Echocardiogram. FINDINGS  Left Ventricle: Left ventricular ejection fraction, by estimation, is 55 to 60%. The left ventricle has normal function. The left ventricle has no regional wall motion abnormalities. Strain was performed and the global longitudinal strain is indeterminate. The left ventricular internal cavity size was normal in size. There is no left ventricular hypertrophy. Left ventricular diastolic parameters are consistent with Grade I diastolic dysfunction (impaired relaxation). Normal left ventricular filling pressure. Right Ventricle: The right ventricular size is normal. No increase in right ventricular wall thickness. Right ventricular systolic function is normal. Tricuspid regurgitation signal is inadequate for assessing PA pressure. Left Atrium: Left atrial size was normal in size. Right Atrium: Right atrial size was normal in size. Pericardium: There is no evidence of pericardial effusion. Mitral Valve: The mitral valve is grossly normal. There is moderate calcification of the anterior mitral valve leaflet(s). No evidence of mitral valve regurgitation. No evidence of mitral valve stenosis. Tricuspid Valve: The tricuspid valve is normal in structure. Tricuspid valve regurgitation is trivial. No evidence of tricuspid stenosis. Aortic Valve: The aortic valve is tricuspid. There is moderate thickening of the aortic valve. Aortic valve regurgitation is not visualized. No aortic stenosis is present. Pulmonic Valve: The pulmonic valve was not well visualized. Pulmonic valve regurgitation is not visualized. No evidence of pulmonic stenosis. Aorta: The aortic root is normal in size and structure. Venous: The inferior vena cava is normal in size with greater than 50% respiratory variability, suggesting right atrial  pressure of 3 mmHg. IAS/Shunts: No atrial level shunt detected by color flow Doppler. Additional Comments: 3D was performed not requiring image post processing on an independent workstation and was indeterminate.  LEFT VENTRICLE PLAX 2D LVIDd:         4.35 cm   Diastology LVIDs:         2.45 cm   LV e' medial:    11.90 cm/s LV PW:         1.00 cm   LV E/e' medial:  8.8 LV IVS:        0.80 cm   LV e' lateral:   11.40 cm/s LVOT diam:     1.80 cm   LV E/e' lateral: 9.2 LV SV:         59 LV SV Index:   36 LVOT Area:     2.54 cm  RIGHT VENTRICLE RV S prime:     20.23 cm/s LEFT ATRIUM             Index        RIGHT ATRIUM           Index LA diam:        3.00 cm 1.87 cm/m   RA Area:     13.50 cm LA Vol (A2C):   57.5 ml 35.80 ml/m  RA Volume:   31.00 ml  19.30 ml/m LA Vol (A4C):   30.9 ml 19.24 ml/m LA Biplane Vol: 42.7 ml 26.59 ml/m  AORTIC VALVE LVOT Vmax:   111.00 cm/s LVOT Vmean:  78.000 cm/s LVOT VTI:    0.230 m  AORTA Ao Root diam: 3.20 cm MITRAL VALVE MV Area (PHT): 4.49 cm     SHUNTS MV Decel Time: 169 msec     Systemic VTI:  0.23 m MV E velocity: 105.00 cm/s  Systemic Diam: 1.80 cm MV A velocity: 128.00 cm/s  MV E/A ratio:  0.82 Vishnu Priya Mallipeddi Electronically signed by Diannah Late Mallipeddi Signature Date/Time: 08/09/2024/12:25:26 PM    Final    CT Angio Chest Pulmonary Embolism (PE) W or WO Contrast Result Date: 08/08/2024 CLINICAL DATA:  Pulmonary embolism suspected. Low to intermediate probability. Abnormal D-dimer. EXAM: CT ANGIOGRAPHY CHEST WITH CONTRAST TECHNIQUE: Multidetector CT imaging of the chest was performed using the standard protocol during bolus administration of intravenous contrast. Multiplanar CT image reconstructions and MIPs were obtained to evaluate the vascular anatomy. RADIATION DOSE REDUCTION: This exam was performed according to the departmental dose-optimization program which includes automated exposure control, adjustment of the mA and/or kV according to patient size  and/or use of iterative reconstruction technique. CONTRAST:  75mL OMNIPAQUE  IOHEXOL  350 MG/ML SOLN COMPARISON:  Chest radiography 08/03/2024 FINDINGS: Cardiovascular: Pulmonary arterial opacification is good. There are no pulmonary emboli. Heart size is normal. Extensive coronary artery calcification is noted. There is some aortic atherosclerotic calcification as well. No aortic aneurysm. No pericardial fluid. Mediastinum/Nodes: No mass or lymphadenopathy. Lungs/Pleura: There are small to moderate bilateral pleural effusions layering dependently with dependent pulmonary atelectasis. Non dependent lung shows interstitial prominence consistent with interstitial edema. No evidence of mass or non dependent infiltrate. Upper Abdomen: No significant upper abdominal finding. Musculoskeletal: Ordinary scoliosis and degenerative change. Review of the MIP images confirms the above findings. IMPRESSION: 1. No pulmonary emboli. 2. Small to moderate bilateral pleural effusions layering dependently with dependent pulmonary atelectasis. Interstitial prominence consistent with interstitial edema. Findings consistent with congestive heart failure. 3. Extensive coronary artery calcification. Aortic atherosclerotic calcification. Aortic Atherosclerosis (ICD10-I70.0). Electronically Signed   By: Oneil Officer M.D.   On: 08/08/2024 10:23   US  Abdomen Complete Result Date: 08/06/2024 EXAM: COMPLETE ABDOMINAL ULTRASOUND TECHNIQUE: Real-time ultrasonography of the abdomen was performed. COMPARISON: 05/12/2022 CLINICAL HISTORY: Hepatosplenomegaly FINDINGS: LIVER: The liver demonstrates normal echogenicity. No intrahepatic biliary ductal dilatation. No mass. BILIARY SYSTEM: No pericholecystic fluid or wall thickening. No cholelithiasis. Negative sonographic Murphy's sign. Common bile duct is within normal limits measuring 5 mm. KIDNEYS: Normal size and contour of kidneys. Normal cortical echogenicity. No hydronephrosis. No calculus. No  mass. PANCREAS: Visualized portions of the pancreas are unremarkable. SPLEEN: No acute abnormality. Spleen is within normal limits in size, 10.8 cm maximally . ADRENAL GLANDS: Normal appearance. No mass. VESSELS: Visualized portion of the aorta is normal. Visualized portion of the inferior vena cava is normal. OTHER: No ascites. IMPRESSION: 1. No acute findings. Electronically signed by: Rockey Kilts MD 08/06/2024 11:01 AM EDT RP Workstation: [id number]   CT Soft Tissue Neck W Contrast Result Date: 08/03/2024 EXAM: CT NECK WITH CONTRAST 08/03/2024 11:06:11 PM TECHNIQUE: CT of the neck was performed with the administration of intravenous contrast. Multiplanar reformatted images are provided for review. Automated exposure control, iterative reconstruction, and/or weight based adjustment of the mA/kV was utilized to reduce the radiation dose to as low as reasonably achievable. CONTRAST: 75mL iohexol  (OMNIPAQUE ) 300 MG/ML solution COMPARISON: None available. CLINICAL HISTORY: Soft tissue infection suspected, neck, xray done. Pt. Clemens today and also complains of a current mouth infection. FINDINGS: AERODIGESTIVE TRACT: No discrete mass. No edema. SALIVARY GLANDS: The parotid and submandibular glands are unremarkable. THYROID : Unremarkable. LYMPH NODES: No suspicious cervical lymphadenopathy. SOFT TISSUES: No mass or fluid collection. BRAIN, ORBITS, SINUSES AND MASTOIDS: No acute abnormality. LUNGS AND MEDIASTINUM: No acute abnormality. BONES: No focal bone abnormality. VASCULATURE: Calcific atherosclerosis of the aortic arch and carotid bifurcations. SPINE: Moderate spinal canal stenosis at the C5-6 level. IMPRESSION: 1.  No acute findings or discrete mass in the neck. Electronically signed by: Franky Stanford MD 08/03/2024 11:34 PM EDT RP Workstation: [id number]   DG Chest Port 1 View Result Date: 08/03/2024 CLINICAL DATA:  Shortness of breath EXAM: PORTABLE CHEST 1 VIEW COMPARISON:  12/26/2006 FINDINGS: Heart and  mediastinal contours are within normal limits. No focal opacities or effusions. No acute bony abnormality. Aortic atherosclerosis. IMPRESSION: No active cardiopulmonary disease. Electronically Signed   By: Franky Crease M.D.   On: 08/03/2024 22:03    Microbiology: Results for orders placed or performed during the hospital encounter of 08/03/24  Group A Strep by PCR     Status: None   Collection Time: 08/03/24  9:38 PM   Specimen: Anterior Nasal Swab; Sterile Swab  Result Value Ref Range Status   Group A Strep by PCR NOT DETECTED NOT DETECTED Final    Comment: Performed at [facility], [address], [place], [place] [address]  Resp panel by RT-PCR (RSV, Flu A&B, Covid) Anterior Nasal Swab     Status: None   Collection Time: 08/03/24  9:38 PM   Specimen: Anterior Nasal Swab  Result Value Ref Range Status   SARS Coronavirus 2 by RT PCR NEGATIVE NEGATIVE Final    Comment: (NOTE) SARS-CoV-2 target nucleic acids are NOT DETECTED.  The SARS-CoV-2 RNA is generally detectable in upper respiratory specimens during the acute phase of infection. The lowest concentration of SARS-CoV-2 viral copies this assay can detect is 138 copies/mL. A negative result does not preclude SARS-Cov-2 infection and should not be used as the sole basis for treatment or other patient management decisions. A negative result may occur with  improper specimen collection/handling, submission of specimen other than nasopharyngeal swab, presence of viral mutation(s) within the areas targeted by this assay, and inadequate number of viral copies(<138 copies/mL). A negative result must be combined with clinical observations, patient history, and epidemiological information. The expected result is Negative.  Fact Sheet for Patients:  BloggerCourse.com  Fact Sheet for Healthcare Providers:  SeriousBroker.it  This test is no t yet approved or cleared by the [place]  FDA  and  has been authorized for detection and/or diagnosis of SARS-CoV-2 by FDA under an Emergency Use Authorization (EUA). This EUA will remain  in effect (meaning this test can be used) for the duration of the COVID-19 declaration under Section 564(b)(1) of the Act, 21 U.S.C.section 360bbb-3(b)(1), unless the authorization is terminated  or revoked sooner.       Influenza A by PCR NEGATIVE NEGATIVE Final   Influenza B by PCR NEGATIVE NEGATIVE Final    Comment: (NOTE) The Xpert Xpress SARS-CoV-2/FLU/RSV plus assay is intended as an aid in the diagnosis of influenza from Nasopharyngeal swab specimens and should not be used as a sole basis for treatment. Nasal washings and aspirates are unacceptable for Xpert Xpress SARS-CoV-2/FLU/RSV testing.  Fact Sheet for Patients: BloggerCourse.com  Fact Sheet for Healthcare Providers: SeriousBroker.it  This test is not yet approved or cleared by the [place]  FDA and has been authorized for detection and/or diagnosis of SARS-CoV-2 by FDA under an Emergency Use Authorization (EUA). This EUA will remain in effect (meaning this test can be used) for the duration of the COVID-19 declaration under Section 564(b)(1) of the Act, 21 U.S.C. section 360bbb-3(b)(1), unless the authorization is terminated or revoked.     Resp Syncytial Virus by PCR NEGATIVE NEGATIVE Final    Comment: (NOTE) Fact Sheet for Patients: BloggerCourse.com  Fact Sheet for Healthcare Providers: SeriousBroker.it  This test is not yet approved or cleared by the [place]  FDA and has been authorized for detection and/or diagnosis of SARS-CoV-2 by FDA under an Emergency Use Authorization (EUA). This EUA will remain in effect (meaning this test can be used) for the duration of the COVID-19 declaration under Section 564(b)(1) of the Act, 21 U.S.C. section 360bbb-3(b)(1),  unless the authorization is terminated or revoked.  Performed at [facility], [address] [place], [place] [address]   Blood culture (routine x 2)     Status: None   Collection Time: 08/03/24 11:57 PM   Specimen: Left Antecubital; Blood  Result Value Ref Range Status   Specimen Description LEFT ANTECUBITAL  Final   Special Requests   Final    BOTTLES DRAWN AEROBIC AND ANAEROBIC Blood Culture adequate volume   Culture   Final    NO GROWTH 5 DAYS Performed at [facility], [address], [place], [place] [address]    Report Status 08/09/2024 FINAL  Final  Blood culture (routine x 2)     Status: None   Collection Time: 08/04/24 12:01 AM   Specimen: BLOOD LEFT FOREARM  Result Value Ref Range Status   Specimen Description BLOOD LEFT FOREARM  Final   Special Requests   Final    BOTTLES DRAWN AEROBIC AND ANAEROBIC Blood Culture adequate volume   Culture   Final    NO GROWTH 5 DAYS Performed at [facility], [address] [place], [place] [address]    Report Status 08/09/2024 FINAL  Final  MRSA Next Gen by PCR, Nasal     Status: None   Collection Time: 08/05/24 11:32 AM   Specimen: Nasal Mucosa; Nasal Swab  Result Value Ref Range Status   MRSA by PCR Next Gen NOT DETECTED NOT DETECTED Final    Comment: (NOTE) The GeneXpert MRSA Assay (FDA approved for NASAL specimens only), is one component of a comprehensive MRSA colonization surveillance program. It is not intended to diagnose MRSA infection nor to guide or monitor treatment for MRSA infections. Test performance is not FDA approved in patients less than 43 years old. Performed at [facility], [address] [place], [place] [address]   Respiratory (~20 pathogens) panel by PCR     Status: None   Collection Time: 08/05/24  1:23 PM   Specimen: Nasopharyngeal Swab; Respiratory  Result Value Ref Range Status   Adenovirus NOT DETECTED NOT DETECTED Final   Coronavirus 229E NOT DETECTED NOT DETECTED Final    Comment:  (NOTE) The Coronavirus on the Respiratory Panel, DOES NOT test for the novel  Coronavirus (2019 nCoV)    Coronavirus HKU1 NOT DETECTED NOT DETECTED Final   Coronavirus NL63 NOT DETECTED NOT DETECTED Final   Coronavirus OC43 NOT DETECTED NOT DETECTED Final   Metapneumovirus NOT DETECTED NOT DETECTED Final   Rhinovirus / Enterovirus NOT DETECTED NOT DETECTED Final   Influenza A NOT DETECTED NOT DETECTED Final   Influenza B NOT DETECTED NOT DETECTED Final   Parainfluenza Virus 1 NOT DETECTED NOT DETECTED Final   Parainfluenza Virus 2 NOT DETECTED NOT DETECTED Final   Parainfluenza Virus 3 NOT DETECTED NOT DETECTED Final   Parainfluenza Virus 4 NOT DETECTED NOT DETECTED Final   Respiratory Syncytial Virus NOT DETECTED NOT DETECTED Final   Bordetella pertussis NOT DETECTED NOT DETECTED Final   Bordetella Parapertussis NOT DETECTED NOT DETECTED Final   Chlamydophila pneumoniae NOT DETECTED NOT DETECTED Final   Mycoplasma pneumoniae NOT DETECTED NOT DETECTED Final  Comment: Performed at [facility], [address], [place], [place] [address]    Labs: CBC: Recent Labs  Lab 08/06/24 0400 08/06/24 0448 08/07/24 0507 08/08/24 0452 08/09/24 0530 08/10/24 0443  WBC 2.0* 2.0* 1.9* 2.1* 2.5* 3.3*  NEUTROABS 0.9*  --  0.8* 0.5* 0.4* 0.5*  HGB 7.9* 7.8* 7.6* 8.0* 9.0* 9.1*  HCT 23.2* 23.2* 22.8* 24.1* 27.2* 27.9*  MCV 98.7 99.6 100.4* 100.8* 101.5* 100.7*  PLT 27* 27* 55* 129* 229 426*   Basic Metabolic Panel: Recent Labs  Lab 08/06/24 0448 08/07/24 0507 08/08/24 0452 08/09/24 0530 08/10/24 0443  NA 136 136 137 138 139  K 2.9* 3.3* 3.6 3.1* 3.9  CL 109 109 110 105 102  CO2 21* 22 22 26 28   GLUCOSE 94 91 111* 114* 104*  BUN 9 6* <5* 6* 7*  CREATININE 0.91 0.81 0.75 0.89 0.94  CALCIUM 7.7* 7.5* 7.8* 8.1* 8.3*  MG 1.1* 1.5* 1.7 1.7 1.8   Liver Function Tests: Recent Labs  Lab 08/03/24 2202 08/08/24 0452 08/09/24 0530  AST 65* 40 40  ALT 27 35 35  ALKPHOS 46 29*  34*  BILITOT 0.6 0.7 0.5  PROT 7.0 4.8* 5.5*  ALBUMIN 3.3* 2.3* 2.8*   CBG: Recent Labs  Lab 08/09/24 0751 08/09/24 1114 08/09/24 1634 08/09/24 2151 08/10/24 0741  GLUCAP 91 134* 126* 108* 98    Discharge time spent: greater than 30 minutes.  Signed: Alm Schneider, MD [facility] 08/10/2024

## 2024-08-10 NOTE — Plan of Care (Signed)
  Problem: Clinical Measurements: Goal: Diagnostic test results will improve Outcome: Progressing Goal: Signs and symptoms of infection will decrease Outcome: Progressing

## 2024-08-13 ENCOUNTER — Other Ambulatory Visit: Payer: Self-pay

## 2024-08-16 DIAGNOSIS — W19XXXA Unspecified fall, initial encounter: Secondary | ICD-10-CM | POA: Diagnosis not present

## 2024-08-16 DIAGNOSIS — S42201A Unspecified fracture of upper end of right humerus, initial encounter for closed fracture: Secondary | ICD-10-CM | POA: Diagnosis not present

## 2024-08-16 DIAGNOSIS — Z09 Encounter for follow-up examination after completed treatment for conditions other than malignant neoplasm: Secondary | ICD-10-CM | POA: Diagnosis not present

## 2024-08-21 LAB — FLOW CYTOMETRY

## 2024-08-27 ENCOUNTER — Other Ambulatory Visit: Payer: Self-pay

## 2024-08-27 DIAGNOSIS — D649 Anemia, unspecified: Secondary | ICD-10-CM

## 2024-08-27 NOTE — Progress Notes (Unsigned)
 [facility] at [facility]  HEMATOLOGY NEW VISIT  Alyssa Norleen PEDLAR, MD  REASON FOR REFERRAL: ***  SUMMARY OF HEMATOLOGIC HISTORY: ***   HISTORY OF PRESENT ILLNESS: [name] 68 y.o. female referred for ***. The patient is accompanied by    I have reviewed the past medical history, past surgical history, social history and family history with the patient   ALLERGIES:  is allergic to cefazolin, codeine, meperidine hcl, morphine, penicillamine, penicillins, sulfonamide derivatives, and sulfur.  MEDICATIONS:  Current Outpatient Medications  Medication Sig Dispense Refill   acetaminophen  (TYLENOL ) 325 MG tablet Take 650 mg by mouth every 6 (six) hours as needed for moderate pain (pain score 4-6).     ALPRAZolam  (XANAX ) 0.5 MG tablet Take 0.5 mg by mouth at bedtime as needed for sleep.     Cholecalciferol (VITAMIN D3) 3000 UNITS TABS Take by mouth. Patient states hat she takes every other day     doxycycline  (VIBRA -TABS) 100 MG tablet Take 1 tablet (100 mg total) by mouth every 12 (twelve) hours. 6 tablet 0   folic acid  (FOLVITE ) 1 MG tablet TAKE THREE TABLETS BY MOUTH ONCE A DAY.     gabapentin  (NEURONTIN ) 300 MG capsule Take 300 mg by mouth 3 (three) times daily.     levocetirizine (XYZAL ) 5 MG tablet Take 5 mg by mouth at bedtime.  2   magnesium  30 MG tablet Take 30 mg by mouth daily.     metFORMIN (GLUCOPHAGE) 1000 MG tablet Take 1,000 mg by mouth 2 (two) times daily.     methotrexate  2.5 MG tablet Take 5 tablets by mouth once a week.     Plecanatide  (TRULANCE ) 3 MG TABS Take 1 tablet (3 mg total) by mouth daily. 30 tablet 2   pyridOXINE  (VITAMIN B-6) 100 MG tablet Take 100 mg by mouth daily.     QUEtiapine  (SEROQUEL ) 100 MG tablet Take 100 mg by mouth at bedtime.     thiamine  (VITAMIN B-1) 100 MG tablet Take 100 mg by mouth daily.     tirzepatide  (MOUNJARO ) 10 MG/0.5ML Pen Inject 10 mg into the skin once a week. 2 mL 5   tiZANidine  (ZANAFLEX ) 4 MG  capsule Take 4 mg by mouth 3 (three) times daily as needed for muscle spasms.     traMADol  (ULTRAM ) 50 MG tablet Take 50 mg by mouth every 6 (six) hours as needed for moderate pain or severe pain.     vitamin B-12 (CYANOCOBALAMIN ) 100 MCG tablet Take 100 mcg by mouth daily.     No current facility-administered medications for this visit.     REVIEW OF SYSTEMS:   Constitutional: Denies fevers, chills or night sweats Eyes: Denies blurriness of vision Ears, nose, mouth, throat, and face: Denies mucositis or sore throat Respiratory: Denies cough, dyspnea or wheezes Cardiovascular: Denies palpitation, chest discomfort or lower extremity swelling Gastrointestinal:  Denies nausea, heartburn or change in bowel habits Skin: Denies abnormal skin rashes Lymphatics: Denies new lymphadenopathy or easy bruising Neurological:Denies numbness, tingling or new weaknesses Behavioral/Psych: Mood is stable, no new changes  All other systems were reviewed with the patient and are negative.  PHYSICAL EXAMINATION:   There were no vitals filed for this visit.  GENERAL:alert, no distress and comfortable SKIN: skin color, texture, turgor are normal, no rashes or significant lesions EYES: normal, Conjunctiva are pink and non-injected, sclera clear OROPHARYNX:no exudate, no erythema and lips, buccal mucosa, and tongue normal  NECK: supple, thyroid  normal size, non-tender,  without nodularity LYMPH:  no palpable lymphadenopathy in the cervical, axillary or inguinal LUNGS: clear to auscultation and percussion with normal breathing effort HEART: regular rate & rhythm and no murmurs and no lower extremity edema ABDOMEN:abdomen soft, non-tender and normal bowel sounds Musculoskeletal:no cyanosis of digits and no clubbing  NEURO: alert & oriented x 3 with fluent speech, no focal motor/sensory deficits  LABORATORY DATA:  I have reviewed the data as listed  Lab Results  Component Value Date   WBC 3.3 (L)  08/10/2024   NEUTROABS 0.5 (L) 08/10/2024   HGB 9.1 (L) 08/10/2024   HCT 27.9 (L) 08/10/2024   MCV 100.7 (H) 08/10/2024   PLT 426 (H) 08/10/2024    No results found for: SPEP, UPEP     Chemistry      Component Value Date/Time   NA 139 08/10/2024 0443   K 3.9 08/10/2024 0443   CL 102 08/10/2024 0443   CO2 28 08/10/2024 0443   BUN 7 (L) 08/10/2024 0443   CREATININE 0.94 08/10/2024 0443      Component Value Date/Time   CALCIUM 8.3 (L) 08/10/2024 0443   ALKPHOS 34 (L) 08/09/2024 0530   AST 40 08/09/2024 0530   ALT 35 08/09/2024 0530   BILITOT 0.5 08/09/2024 0530       RADIOGRAPHIC STUDIES: I have personally reviewed the radiological images as listed and agreed with the findings in the report. ECHOCARDIOGRAM COMPLETE    ECHOCARDIOGRAM REPORT       Patient Name:   [name] Date of Exam: 08/08/2024 Medical Rec #:  [id number]       Height:       60.0 in Accession #:    [id number]      Weight:       140.4 lb Date of Birth:  March 30, 1956      BSA:          1.606 m Patient Age:    80 years        BP:           129/67 mmHg Patient Gender: F               HR:           110 bpm. Exam Location:  [facility]  Procedure: 2D Echo, Cardiac Doppler and Color Doppler (Both Spectral and Color            Flow Doppler were utilized during procedure).  Indications:    CHF, Acute Diastolic l50.31   History:        Patient has no prior history of Echocardiogram examinations.                 Risk Factors:Hypertension, Diabetes and Dyslipidemia.   Sonographer:    Aida Pizza RCS Referring Phys: [phone] DAVID TAT  IMPRESSIONS   1. Left ventricular ejection fraction, by estimation, is 55 to 60%. The left ventricle has normal function. The left ventricle has no regional wall motion abnormalities. Left ventricular diastolic parameters are consistent with Grade I diastolic  dysfunction (impaired relaxation).  2. Right ventricular systolic function is normal. The right ventricular size  is normal. Tricuspid regurgitation signal is inadequate for assessing PA pressure.  3. The mitral valve is grossly normal. No evidence of mitral valve regurgitation. No evidence of mitral stenosis.  4. The aortic valve is tricuspid. There is moderate thickening of the aortic valve. Aortic valve regurgitation is not visualized. No aortic stenosis is present.  5. The inferior  vena cava is normal in size with greater than 50% respiratory variability, suggesting right atrial pressure of 3 mmHg.  Comparison(s): No prior Echocardiogram.  FINDINGS  Left Ventricle: Left ventricular ejection fraction, by estimation, is 55 to 60%. The left ventricle has normal function. The left ventricle has no regional wall motion abnormalities. Strain was performed and the global longitudinal strain is  indeterminate. The left ventricular internal cavity size was normal in size. There is no left ventricular hypertrophy. Left ventricular diastolic parameters are consistent with Grade I diastolic dysfunction (impaired relaxation). Normal left ventricular  filling pressure.  Right Ventricle: The right ventricular size is normal. No increase in right ventricular wall thickness. Right ventricular systolic function is normal. Tricuspid regurgitation signal is inadequate for assessing PA pressure.  Left Atrium: Left atrial size was normal in size.  Right Atrium: Right atrial size was normal in size.  Pericardium: There is no evidence of pericardial effusion.  Mitral Valve: The mitral valve is grossly normal. There is moderate calcification of the anterior mitral valve leaflet(s). No evidence of mitral valve regurgitation. No evidence of mitral valve stenosis.  Tricuspid Valve: The tricuspid valve is normal in structure. Tricuspid valve regurgitation is trivial. No evidence of tricuspid stenosis.  Aortic Valve: The aortic valve is tricuspid. There is moderate thickening of the aortic valve. Aortic valve regurgitation is not  visualized. No aortic stenosis is present.  Pulmonic Valve: The pulmonic valve was not well visualized. Pulmonic valve regurgitation is not visualized. No evidence of pulmonic stenosis.  Aorta: The aortic root is normal in size and structure.  Venous: The inferior vena cava is normal in size with greater than 50% respiratory variability, suggesting right atrial pressure of 3 mmHg.  IAS/Shunts: No atrial level shunt detected by color flow Doppler.  Additional Comments: 3D was performed not requiring image post processing on an independent workstation and was indeterminate.    LEFT VENTRICLE PLAX 2D LVIDd:         4.35 cm   Diastology LVIDs:         2.45 cm   LV e' medial:    11.90 cm/s LV PW:         1.00 cm   LV E/e' medial:  8.8 LV IVS:        0.80 cm   LV e' lateral:   11.40 cm/s LVOT diam:     1.80 cm   LV E/e' lateral: 9.2 LV SV:         59 LV SV Index:   36 LVOT Area:     2.54 cm    RIGHT VENTRICLE RV S prime:     20.23 cm/s  LEFT ATRIUM             Index        RIGHT ATRIUM           Index LA diam:        3.00 cm 1.87 cm/m   RA Area:     13.50 cm LA Vol (A2C):   57.5 ml 35.80 ml/m  RA Volume:   31.00 ml  19.30 ml/m LA Vol (A4C):   30.9 ml 19.24 ml/m LA Biplane Vol: 42.7 ml 26.59 ml/m  AORTIC VALVE LVOT Vmax:   111.00 cm/s LVOT Vmean:  78.000 cm/s LVOT VTI:    0.230 m   AORTA Ao Root diam: 3.20 cm  MITRAL VALVE MV Area (PHT): 4.49 cm     SHUNTS MV Decel Time: 169 msec  Systemic VTI:  0.23 m MV E velocity: 105.00 cm/s  Systemic Diam: 1.80 cm MV A velocity: 128.00 cm/s MV E/A ratio:  0.82  Vishnu Priya Mallipeddi Electronically signed by Diannah Late Mallipeddi Signature Date/Time: 08/09/2024/12:25:26 PM      Final     ASSESSMENT & PLAN:  Patient is a 68 y.o. female referred for ***  Assessment & Plan    No orders of the defined types were placed in this encounter.   The total time spent in the appointment was {CHL ONC TIME VISIT -  DTPQU:[id number]} encounter with patients including review of chart and various tests results, discussions about plan of care and coordination of care plan   All questions were answered. The patient knows to call the clinic with any problems, questions or concerns. No barriers to learning was detected.   Mickiel Dry, MD 9/9/202510:48 PM

## 2024-08-28 ENCOUNTER — Inpatient Hospital Stay: Attending: Oncology | Admitting: Oncology

## 2024-08-28 ENCOUNTER — Inpatient Hospital Stay

## 2024-08-28 VITALS — BP 151/80 | HR 101 | Temp 98.1°F | Resp 19 | Ht 60.0 in | Wt 128.0 lb

## 2024-08-28 DIAGNOSIS — D61818 Other pancytopenia: Secondary | ICD-10-CM | POA: Diagnosis not present

## 2024-08-28 DIAGNOSIS — E876 Hypokalemia: Secondary | ICD-10-CM | POA: Diagnosis not present

## 2024-08-28 DIAGNOSIS — D649 Anemia, unspecified: Secondary | ICD-10-CM | POA: Insufficient documentation

## 2024-08-28 LAB — CBC WITH DIFFERENTIAL/PLATELET
Abs Immature Granulocytes: 0.03 K/uL (ref 0.00–0.07)
Basophils Absolute: 0.1 K/uL (ref 0.0–0.1)
Basophils Relative: 1 %
Eosinophils Absolute: 0.1 K/uL (ref 0.0–0.5)
Eosinophils Relative: 1 %
HCT: 35.9 % — ABNORMAL LOW (ref 36.0–46.0)
Hemoglobin: 11.8 g/dL — ABNORMAL LOW (ref 12.0–15.0)
Immature Granulocytes: 0 %
Lymphocytes Relative: 31 %
Lymphs Abs: 2.1 K/uL (ref 0.7–4.0)
MCH: 33.1 pg (ref 26.0–34.0)
MCHC: 32.9 g/dL (ref 30.0–36.0)
MCV: 100.6 fL — ABNORMAL HIGH (ref 80.0–100.0)
Monocytes Absolute: 0.5 K/uL (ref 0.1–1.0)
Monocytes Relative: 7 %
Neutro Abs: 4 K/uL (ref 1.7–7.7)
Neutrophils Relative %: 60 %
Platelets: 195 K/uL (ref 150–400)
RBC: 3.57 MIL/uL — ABNORMAL LOW (ref 3.87–5.11)
RDW: 14.5 % (ref 11.5–15.5)
WBC: 6.7 K/uL (ref 4.0–10.5)
nRBC: 0 % (ref 0.0–0.2)

## 2024-08-28 LAB — COMPREHENSIVE METABOLIC PANEL WITH GFR
ALT: 17 U/L (ref 0–44)
AST: 26 U/L (ref 15–41)
Albumin: 4.1 g/dL (ref 3.5–5.0)
Alkaline Phosphatase: 38 U/L (ref 38–126)
Anion gap: 14 (ref 5–15)
BUN: 18 mg/dL (ref 8–23)
CO2: 25 mmol/L (ref 22–32)
Calcium: 9.9 mg/dL (ref 8.9–10.3)
Chloride: 101 mmol/L (ref 98–111)
Creatinine, Ser: 1.03 mg/dL — ABNORMAL HIGH (ref 0.44–1.00)
GFR, Estimated: 59 mL/min — ABNORMAL LOW (ref >60)
Glucose, Bld: 97 mg/dL (ref 70–99)
Potassium: 3.4 mmol/L — ABNORMAL LOW (ref 3.5–5.1)
Sodium: 140 mmol/L (ref 135–145)
Total Bilirubin: 0.6 mg/dL (ref 0.0–1.2)
Total Protein: 7.6 g/dL (ref 6.5–8.1)

## 2024-08-28 NOTE — Patient Instructions (Signed)
 [facility] at [facility] Discharge Instructions   You were seen and examined today by Dr. Davonna.  She reviewed the results of your lab work which are normal/stable.   We will see you back as needed. We will not schedule any follow up in our clinic at this time. We are happy to see you in the future if needed.    Return as needed.    Thank you for choosing [facility] at [facility] to provide your oncology and hematology care.  To afford each patient quality time with our provider, please arrive at least 15 minutes before your scheduled appointment time.   If you have a lab appointment with the Cancer Center please come in thru the Main Entrance and check in at the main information desk.  You need to re-schedule your appointment should you arrive 10 or more minutes late.  We strive to give you quality time with our providers, and arriving late affects you and other patients whose appointments are after yours.  Also, if you no show three or more times for appointments you may be dismissed from the clinic at the providers discretion.     Again, thank you for choosing [facility].  Our hope is that these requests will decrease the amount of time that you wait before being seen by our physicians.       _____________________________________________________________  Should you have questions after your visit to [facility], please contact our office at [phone] and follow the prompts.  Our office hours are 8:00 a.m. and 4:30 p.m. Monday - Friday.  Please note that voicemails left after 4:00 p.m. may not be returned until the following business day.  We are closed weekends and major holidays.  You do have access to a nurse 24-7, just call the main number to the clinic [phone] and do not press any options, hold on the line and a nurse will answer the phone.    For prescription refill requests, have your pharmacy  contact our office and allow 72 hours.    Due to Covid, you will need to wear a mask upon entering the hospital. If you do not have a mask, a mask will be given to you at the Main Entrance upon arrival. For doctor visits, patients may have 1 support person age 41 or older with them. For treatment visits, patients can not have anyone with them due to social distancing guidelines and our immunocompromised population.

## 2024-08-28 NOTE — Assessment & Plan Note (Addendum)
 Recent hospital admission for weakness and was found to have pancytopenia Patient has mild iron deficiency and normal B12 and folate levels. Patient has been on methotrexate  for 10-12 years at this time. Methotrexate  level: normal HIV panel: Negative No B symptoms at this time.  No lymphadenopathy palpated. EBV DNA quantified on was positive but very low quantity. Rest of the infectious workup was negative  - Discussed with the patient the differential can be due to brown recluse spider bite or EBV infection that resolved - Labs reviewed today: CMP: Mild hypokalemia, normal creatinine, normal LFTs.  CBC: Normal WBC, hemoglobin: 11.8, platelets: 195 - Apart from very mild anemia likely secondary to iron deficiency, her counts recovered completely. - Patient reports severe constipation would not tolerate oral iron.  Recommended dietary intake of high-protein and iron rich food along with vitamin C.  If patient has worsening iron deficiency, can consider IV iron at that time  No other hematological needs at this time.  Advised patient to reach out to us  in future with questions or concerns or if anemia worsens or needs IV iron.

## 2024-08-28 NOTE — Assessment & Plan Note (Addendum)
 Mild hypokalemia with a potassium of 3.4 Patient denies diarrhea  -Recommended dietary intake of bananas,coconut water and electrolytes and watch out for symptoms of palpitations and chest pain

## 2024-09-06 ENCOUNTER — Other Ambulatory Visit (HOSPITAL_COMMUNITY): Payer: Self-pay

## 2024-09-19 DIAGNOSIS — E559 Vitamin D deficiency, unspecified: Secondary | ICD-10-CM | POA: Diagnosis not present

## 2024-09-19 DIAGNOSIS — E782 Mixed hyperlipidemia: Secondary | ICD-10-CM | POA: Diagnosis not present

## 2024-09-19 DIAGNOSIS — E1169 Type 2 diabetes mellitus with other specified complication: Secondary | ICD-10-CM | POA: Diagnosis not present

## 2024-09-24 ENCOUNTER — Other Ambulatory Visit (HOSPITAL_COMMUNITY): Payer: Self-pay

## 2024-09-24 DIAGNOSIS — S42201A Unspecified fracture of upper end of right humerus, initial encounter for closed fracture: Secondary | ICD-10-CM | POA: Diagnosis not present

## 2024-09-24 DIAGNOSIS — Z0000 Encounter for general adult medical examination without abnormal findings: Secondary | ICD-10-CM | POA: Diagnosis not present

## 2024-09-24 DIAGNOSIS — D61818 Other pancytopenia: Secondary | ICD-10-CM | POA: Diagnosis not present

## 2024-09-24 DIAGNOSIS — E1122 Type 2 diabetes mellitus with diabetic chronic kidney disease: Secondary | ICD-10-CM | POA: Diagnosis not present

## 2024-09-24 DIAGNOSIS — N1831 Chronic kidney disease, stage 3a: Secondary | ICD-10-CM | POA: Diagnosis not present

## 2024-09-24 DIAGNOSIS — M545 Low back pain, unspecified: Secondary | ICD-10-CM | POA: Diagnosis not present

## 2024-09-24 DIAGNOSIS — G8929 Other chronic pain: Secondary | ICD-10-CM | POA: Diagnosis not present

## 2024-09-24 DIAGNOSIS — Z8679 Personal history of other diseases of the circulatory system: Secondary | ICD-10-CM | POA: Diagnosis not present

## 2024-09-24 DIAGNOSIS — Z0001 Encounter for general adult medical examination with abnormal findings: Secondary | ICD-10-CM | POA: Diagnosis not present

## 2024-09-24 DIAGNOSIS — I129 Hypertensive chronic kidney disease with stage 1 through stage 4 chronic kidney disease, or unspecified chronic kidney disease: Secondary | ICD-10-CM | POA: Diagnosis not present

## 2024-09-24 DIAGNOSIS — I700 Atherosclerosis of aorta: Secondary | ICD-10-CM | POA: Diagnosis not present

## 2024-09-24 DIAGNOSIS — Z23 Encounter for immunization: Secondary | ICD-10-CM | POA: Diagnosis not present

## 2024-09-24 MED ORDER — TRULANCE 3 MG PO TABS
3.0000 mg | ORAL_TABLET | Freq: Every day | ORAL | 2 refills | Status: DC
  Filled 2024-12-09: qty 30, 30d supply, fill #0

## 2024-09-25 ENCOUNTER — Other Ambulatory Visit (HOSPITAL_COMMUNITY): Payer: Self-pay | Admitting: Internal Medicine

## 2024-09-25 DIAGNOSIS — Z1231 Encounter for screening mammogram for malignant neoplasm of breast: Secondary | ICD-10-CM

## 2024-10-03 ENCOUNTER — Other Ambulatory Visit (HOSPITAL_COMMUNITY): Payer: Self-pay | Admitting: Internal Medicine

## 2024-10-03 DIAGNOSIS — Z1231 Encounter for screening mammogram for malignant neoplasm of breast: Secondary | ICD-10-CM

## 2024-10-03 DIAGNOSIS — M810 Age-related osteoporosis without current pathological fracture: Secondary | ICD-10-CM

## 2024-10-07 ENCOUNTER — Ambulatory Visit (HOSPITAL_COMMUNITY)

## 2024-10-09 ENCOUNTER — Other Ambulatory Visit (HOSPITAL_COMMUNITY): Payer: Self-pay

## 2024-10-14 ENCOUNTER — Ambulatory Visit (HOSPITAL_COMMUNITY)

## 2024-10-16 ENCOUNTER — Other Ambulatory Visit (HOSPITAL_COMMUNITY)

## 2024-10-16 ENCOUNTER — Ambulatory Visit (HOSPITAL_COMMUNITY)

## 2024-10-30 ENCOUNTER — Ambulatory Visit (HOSPITAL_COMMUNITY)
Admission: RE | Admit: 2024-10-30 | Discharge: 2024-10-30 | Disposition: A | Discharge status: Home / Self Care | Source: Ambulatory Visit | Attending: Internal Medicine | Admitting: Internal Medicine

## 2024-10-30 DIAGNOSIS — M810 Age-related osteoporosis without current pathological fracture: Secondary | ICD-10-CM | POA: Insufficient documentation

## 2024-10-30 DIAGNOSIS — Z1231 Encounter for screening mammogram for malignant neoplasm of breast: Secondary | ICD-10-CM | POA: Insufficient documentation

## 2024-10-30 DIAGNOSIS — Z780 Asymptomatic menopausal state: Secondary | ICD-10-CM | POA: Diagnosis not present

## 2024-11-08 ENCOUNTER — Other Ambulatory Visit (HOSPITAL_COMMUNITY): Payer: Self-pay

## 2024-11-08 DIAGNOSIS — G8929 Other chronic pain: Secondary | ICD-10-CM | POA: Diagnosis not present

## 2024-11-08 DIAGNOSIS — E782 Mixed hyperlipidemia: Secondary | ICD-10-CM | POA: Diagnosis not present

## 2024-11-08 DIAGNOSIS — S42201A Unspecified fracture of upper end of right humerus, initial encounter for closed fracture: Secondary | ICD-10-CM | POA: Diagnosis not present

## 2024-11-08 DIAGNOSIS — M810 Age-related osteoporosis without current pathological fracture: Secondary | ICD-10-CM | POA: Diagnosis not present

## 2024-11-08 DIAGNOSIS — I10 Essential (primary) hypertension: Secondary | ICD-10-CM | POA: Diagnosis not present

## 2024-11-08 DIAGNOSIS — M791 Myalgia, unspecified site: Secondary | ICD-10-CM | POA: Diagnosis not present

## 2024-11-08 DIAGNOSIS — L4050 Arthropathic psoriasis, unspecified: Secondary | ICD-10-CM | POA: Diagnosis not present

## 2024-11-08 DIAGNOSIS — M545 Low back pain, unspecified: Secondary | ICD-10-CM | POA: Diagnosis not present

## 2024-11-08 DIAGNOSIS — S42201D Unspecified fracture of upper end of right humerus, subsequent encounter for fracture with routine healing: Secondary | ICD-10-CM | POA: Diagnosis not present

## 2024-12-09 ENCOUNTER — Other Ambulatory Visit (HOSPITAL_COMMUNITY): Payer: Self-pay

## 2024-12-10 ENCOUNTER — Other Ambulatory Visit (HOSPITAL_COMMUNITY): Payer: Self-pay

## 2024-12-10 ENCOUNTER — Encounter: Payer: Self-pay | Admitting: Cardiology

## 2024-12-10 ENCOUNTER — Ambulatory Visit: Attending: Cardiology | Admitting: Cardiology

## 2024-12-10 VITALS — BP 117/73 | HR 89 | Ht 60.0 in | Wt 117.6 lb

## 2024-12-10 DIAGNOSIS — Z8709 Personal history of other diseases of the respiratory system: Secondary | ICD-10-CM | POA: Diagnosis not present

## 2024-12-10 DIAGNOSIS — E782 Mixed hyperlipidemia: Secondary | ICD-10-CM | POA: Diagnosis not present

## 2024-12-10 DIAGNOSIS — I10 Essential (primary) hypertension: Secondary | ICD-10-CM

## 2024-12-10 MED ORDER — TRIAMTERENE-HCTZ 37.5-25 MG PO TABS: 1.0000 | ORAL_TABLET | Freq: Every day | ORAL | 6 refills | Status: AC

## 2024-12-10 MED ORDER — TRULANCE 3 MG PO TABS
3.0000 mg | ORAL_TABLET | Freq: Every day | ORAL | 2 refills | Status: AC
  Filled 2024-12-10: qty 30, 30d supply, fill #0
  Filled 2025-01-01 – 2025-01-02 (×2): qty 30, 30d supply, fill #1

## 2024-12-10 NOTE — Progress Notes (Addendum)
 "     Clinical Summary [name] is a 68 y.o.female seen today as a new patient for the following medical problems.    1.Pulmonary edema - 07/2024 admission with acute HFpEF. Admission complicated by SIRs, EBV, hyponatremia, pancytopenic - 07/2024 echo: LVEF 55-60%, no WMAs, grade I dd, noraml RV function, normal LA - 07/2024 CT PE: small to moderate bilateral pleural effusions, +edema.  - BNP 154 - diuresed during admission.   -no SOB/DOE, no LE edema. - compliant with meds    2. HLD - back pains on repatha - statins did not tolerate  Past Medical History:  Diagnosis Date   Arthritis    Celiac disease    Diverticulitis    Fibromyalgia    Osteoarthritis (arthritis due to wear and tear of joints)    Renal disorder      Allergies[1]   Current Outpatient Medications  Medication Sig Dispense Refill   acetaminophen  (TYLENOL ) 325 MG tablet Take 650 mg by mouth every 6 (six) hours as needed for moderate pain (pain score 4-6).     ALPRAZolam  (XANAX ) 0.5 MG tablet Take 0.5 mg by mouth at bedtime as needed for sleep.     Cholecalciferol (VITAMIN D3) 3000 UNITS TABS Take by mouth. Patient states hat she takes every other day     doxycycline  (VIBRA -TABS) 100 MG tablet Take 1 tablet (100 mg total) by mouth every 12 (twelve) hours. 6 tablet 0   folic acid  (FOLVITE ) 1 MG tablet TAKE THREE TABLETS BY MOUTH ONCE A DAY.     gabapentin  (NEURONTIN ) 300 MG capsule Take 300 mg by mouth 3 (three) times daily.     levocetirizine (XYZAL ) 5 MG tablet Take 5 mg by mouth at bedtime.  2   magnesium  30 MG tablet Take 30 mg by mouth daily.     metFORMIN (GLUCOPHAGE) 1000 MG tablet Take 1,000 mg by mouth 2 (two) times daily.     methotrexate  2.5 MG tablet Take 5 tablets by mouth once a week.     Plecanatide  (TRULANCE ) 3 MG TABS Take 1 tablet (3 mg total) by mouth daily. 30 tablet 2   Plecanatide  (TRULANCE ) 3 MG TABS Take 1 tablet (3 mg total) by mouth daily. 30 tablet 2   pyridOXINE  (VITAMIN B-6)  100 MG tablet Take 100 mg by mouth daily.     QUEtiapine  (SEROQUEL ) 100 MG tablet Take 100 mg by mouth at bedtime.     thiamine  (VITAMIN B-1) 100 MG tablet Take 100 mg by mouth daily.     tirzepatide  (MOUNJARO ) 10 MG/0.5ML Pen Inject 10 mg into the skin once a week. 2 mL 5   tiZANidine  (ZANAFLEX ) 4 MG capsule Take 4 mg by mouth 3 (three) times daily as needed for muscle spasms.     traMADol  (ULTRAM ) 50 MG tablet Take 50 mg by mouth every 6 (six) hours as needed for moderate pain or severe pain.     vitamin B-12 (CYANOCOBALAMIN ) 100 MCG tablet Take 100 mcg by mouth daily.     No current facility-administered medications for this visit.     Past Surgical History:  Procedure Laterality Date   APPENDECTOMY     BACK SURGERY     Patient states that she has had 5 surgeries   COLONOSCOPY     2011   NECK SURGERY     Cerivcal Fusion   NECK SURGERY     REDUCTION MAMMAPLASTY Bilateral    TONSILLECTOMY AND ADENOIDECTOMY     UPPER GASTROINTESTINAL  ENDOSCOPY  2011     Allergies[2]    Family History  Problem Relation Age of Onset   Heart disease Mother      Social History [name] reports that she has quit smoking. Her smoking use included cigarettes. She started smoking about 12 years ago. She has never used smokeless tobacco. [name] reports no history of alcohol use.     Physical Examination Today's Vitals   12/10/24 1401  BP: 117/73  Pulse: 89  SpO2: 96%  Weight: 117 lb 9.6 oz (53.3 kg)  Height: 5' (1.524 m)   Body mass index is 22.97 kg/m.  Gen: resting comfortably, no acute distress HEENT: no scleral icterus, pupils equal round and reactive, no palptable cervical adenopathy,  CV: RRR, no mrg, no jvd Resp: Clear to auscultation bilaterally GI: abdomen is soft, non-tender, non-distended, normal bowel sounds, no hepatosplenomegaly MSK: extremities are warm, no edema.  Skin: warm, no rash Neuro:  no focal deficits Psych: appropriate affect     Assessment  and Plan  1.Pulmonary edema - isoalted episode of pulmonary edema while in the hospital in setting of sever systemic illness. Echo overall benign other than just mild diastolic dysfunction. Has not had any recurrent fluid issues. Id be reluctant to label this as HFpEF as opposed to transient edema in setting of systemic illness - no symptoms, continue to monitor at this time. On maxzide more for bp, she is euvoelmic today  2. HLD - intolerant to statins, some back pains on repaths. Unclear how much symptoms are chronic vs specifically secondary to medications - refer to pharmD to consider options  EKG today shows NSR  F/u 1 year      Dorn PHEBE Ross, M.D.     [1]  Allergies Allergen Reactions   Cefazolin Anaphylaxis    ANCEF   Codeine Nausea Only   Meperidine Hcl Nausea Only   Morphine Nausea Only   Penicillamine    Penicillins Hives   Sulfonamide Derivatives Other (See Comments)    REACTION IS UNKNOWN   Sulfur Other (See Comments)  [2]  Allergies Allergen Reactions   Cefazolin Anaphylaxis    ANCEF   Codeine Nausea Only   Meperidine Hcl Nausea Only   Morphine Nausea Only   Penicillamine    Penicillins Hives   Sulfonamide Derivatives Other (See Comments)    REACTION IS UNKNOWN   Sulfur Other (See Comments)   "

## 2024-12-10 NOTE — Patient Instructions (Signed)
 Medication Instructions:   Begin Maxzide 37.5mg  / 25mg  daily  Continue all other medications.     Labwork:  none  Testing/Procedures:  none  Follow-Up:  Your physician wants you to follow up in:  1 year.  You should receive a recall letter in the mail about 2 months prior to the time you are due.  If you don't receive this, please call our office to schedule your follow up appointment.      Any Other Special Instructions Will Be Listed Below (If Applicable).  You have been referred to:  pharmD   If you need a refill on your cardiac medications before your next appointment, please call your pharmacy.

## 2024-12-10 NOTE — Addendum Note (Signed)
 Addended by: Rickita Forstner G on: 12/10/2024 02:44 PM   Modules accepted: Orders

## 2024-12-31 ENCOUNTER — Other Ambulatory Visit (HOSPITAL_COMMUNITY): Payer: Self-pay

## 2025-01-01 ENCOUNTER — Other Ambulatory Visit (HOSPITAL_COMMUNITY): Payer: Self-pay

## 2025-01-02 ENCOUNTER — Other Ambulatory Visit (HOSPITAL_COMMUNITY): Payer: Self-pay

## 2025-01-03 ENCOUNTER — Other Ambulatory Visit (HOSPITAL_COMMUNITY): Payer: Self-pay

## 2025-02-12 ENCOUNTER — Ambulatory Visit: Admitting: Pharmacist
